# Patient Record
Sex: Male | Born: 1970
Health system: Southern US, Community
[De-identification: ages and names within clinical notes are randomized; demographics above are authoritative.]

## PROBLEM LIST (undated history)

## (undated) ENCOUNTER — Emergency Department: Admission: EM | Discharge status: Absent without leave | Payer: Self-pay

## (undated) DIAGNOSIS — I10 Essential (primary) hypertension: Secondary | ICD-10-CM

## (undated) HISTORY — PX: OTHER SURGICAL HISTORY: SHX169

---

## 2013-07-25 ENCOUNTER — Emergency Department: Payer: Self-pay | Admitting: Emergency Medicine

## 2013-07-25 LAB — RAPID INFLUENZA A&B ANTIGENS

## 2013-08-17 ENCOUNTER — Emergency Department: Payer: Self-pay | Admitting: Emergency Medicine

## 2013-09-22 ENCOUNTER — Emergency Department: Payer: Self-pay | Admitting: Emergency Medicine

## 2013-09-24 ENCOUNTER — Emergency Department: Payer: Self-pay | Admitting: Emergency Medicine

## 2013-11-26 ENCOUNTER — Emergency Department: Payer: Self-pay | Admitting: Emergency Medicine

## 2013-11-26 LAB — COMPREHENSIVE METABOLIC PANEL
ALBUMIN: 3.9 g/dL (ref 3.4–5.0)
ALT: 33 U/L (ref 12–78)
Alkaline Phosphatase: 111 U/L
Anion Gap: 8 (ref 7–16)
BUN: 14 mg/dL (ref 7–18)
Bilirubin,Total: 0.3 mg/dL (ref 0.2–1.0)
CO2: 24 mmol/L (ref 21–32)
Calcium, Total: 8.9 mg/dL (ref 8.5–10.1)
Chloride: 105 mmol/L (ref 98–107)
Creatinine: 1.34 mg/dL — ABNORMAL HIGH (ref 0.60–1.30)
EGFR (African American): >60
EGFR (Non-African Amer.): >60
Glucose: 86 mg/dL (ref 65–99)
Osmolality: 274 (ref 275–301)
Potassium: 3.5 mmol/L (ref 3.5–5.1)
SGOT(AST): 30 U/L (ref 15–37)
Sodium: 137 mmol/L (ref 136–145)
Total Protein: 7.8 g/dL (ref 6.4–8.2)

## 2013-11-26 LAB — URINALYSIS, COMPLETE
Bacteria: NONE SEEN
Bilirubin,UR: NEGATIVE
Blood: NEGATIVE
GLUCOSE, UR: NEGATIVE mg/dL (ref 0–75)
KETONE: NEGATIVE
Leukocyte Esterase: NEGATIVE
Nitrite: NEGATIVE
PH: 6 (ref 4.5–8.0)
RBC,UR: 1 /HPF (ref 0–5)
SPECIFIC GRAVITY: 1.017 (ref 1.003–1.030)
Squamous Epithelial: NONE SEEN

## 2013-11-26 LAB — CBC WITH DIFFERENTIAL/PLATELET
BASOS ABS: 0.1 10*3/uL (ref 0.0–0.1)
Basophil %: 1.5 %
Eosinophil #: 0.1 10*3/uL (ref 0.0–0.7)
Eosinophil %: 0.8 %
HCT: 40.3 % (ref 40.0–52.0)
HGB: 13.3 g/dL (ref 13.0–18.0)
Lymphocyte #: 2.3 10*3/uL (ref 1.0–3.6)
Lymphocyte %: 31.5 %
MCH: 29.4 pg (ref 26.0–34.0)
MCHC: 32.9 g/dL (ref 32.0–36.0)
MCV: 89 fL (ref 80–100)
MONO ABS: 0.9 x10 3/mm (ref 0.2–1.0)
Monocyte %: 12.9 %
NEUTROS ABS: 3.9 10*3/uL (ref 1.4–6.5)
Neutrophil %: 53.3 %
Platelet: 204 10*3/uL (ref 150–440)
RBC: 4.53 10*6/uL (ref 4.40–5.90)
RDW: 13.1 % (ref 11.5–14.5)
WBC: 7.3 10*3/uL (ref 3.8–10.6)

## 2013-11-26 LAB — LIPASE, BLOOD: LIPASE: 147 U/L (ref 73–393)

## 2013-11-26 LAB — TROPONIN I: Troponin-I: <0.02 ng/mL

## 2014-01-14 ENCOUNTER — Encounter (HOSPITAL_COMMUNITY): Payer: Self-pay | Admitting: Emergency Medicine

## 2014-01-14 ENCOUNTER — Emergency Department (HOSPITAL_COMMUNITY)
Admission: EM | Admit: 2014-01-14 | Discharge: 2014-01-14 | Disposition: A | Discharge status: Home / Self Care | Payer: Self-pay | Attending: Emergency Medicine | Admitting: Emergency Medicine

## 2014-01-14 DIAGNOSIS — M79609 Pain in unspecified limb: Secondary | ICD-10-CM | POA: Insufficient documentation

## 2014-01-14 DIAGNOSIS — Z87891 Personal history of nicotine dependence: Secondary | ICD-10-CM | POA: Insufficient documentation

## 2014-01-14 DIAGNOSIS — M722 Plantar fascial fibromatosis: Secondary | ICD-10-CM | POA: Insufficient documentation

## 2014-01-14 DIAGNOSIS — I10 Essential (primary) hypertension: Secondary | ICD-10-CM | POA: Insufficient documentation

## 2014-01-14 DIAGNOSIS — Z880 Allergy status to penicillin: Secondary | ICD-10-CM | POA: Insufficient documentation

## 2014-01-14 HISTORY — DX: Essential (primary) hypertension: I10

## 2014-01-14 MED ORDER — TRAMADOL-ACETAMINOPHEN 37.5-325 MG PO TABS
1.0000 | ORAL_TABLET | Freq: Four times a day (QID) | ORAL | Status: DC | PRN
Start: 2014-01-14 — End: 2015-01-17

## 2014-01-14 NOTE — ED Notes (Addendum)
Pain in the arch of r/foot x 6 months. Hx of plantar fascitis in same foot. Denies recent trauma. Pain unresponsive to OTC meds

## 2014-01-14 NOTE — ED Provider Notes (Signed)
Medical screening examination/treatment/procedure(s) were performed by non-physician practitioner and as supervising physician I was immediately available for consultation/collaboration.   EKG Interpretation None       Threasa Beards, MD 01/14/14 1515

## 2014-01-14 NOTE — Discharge Instructions (Signed)
Take the prescribed medication as directed.  May wish to freeze water bottle and roll under foot as we discussed. Follow-up with orthopedics for further management-- call and schedule appt. Return to the ED for new or worsening symptoms.

## 2014-01-14 NOTE — ED Provider Notes (Signed)
CSN: [id number]     Arrival date & time 01/14/14  1257 History  This chart was scribed for [name], working with Threasa Beards, MD found by Starleen Arms, ED Scribe. This patient was seen in room WTR6/WTR6 and the patient's care was started at 1:28 PM.     Chief Complaint  Patient presents with  . Foot Pain    6 month hx of foot pain   The history is provided by the patient. No language interpreter was used.    HPI Comments: Ronald [name] is a 43 y.o. male who presents to the Emergency Department complaining of worsening right medial foot pain.  Patient states 1.5 years ago he was on a treadmill and the following day he woke and it felt like "fire shot up through my heel". He was subsequently diagnosed with plantar fasciatis.  He states the pain subsided and recently returned with worsening.  He states he currently has to walk on the lateral aspect of his foot due to the pain.  He reports that this alteration of gait has caused pain in his right calf and right hip.  He states that the pain is relieved by rolling a can underneath the affected foot and aggravated by walking. He currently uses shoe inserts to relieve the pain which he states "work to a certain degree".   Past Medical History  Diagnosis Date  . Hypertension    Past Surgical History  Procedure Laterality Date  . Arm surgery      torn bicept   Family History  Problem Relation Age of Onset  . Hypertension Mother   . Hypertension Father   . Cancer Other    History  Substance Use Topics  . Smoking status: Former Research scientist (life sciences)  . Smokeless tobacco: Not on file  . Alcohol Use: No    Review of Systems  Musculoskeletal: Positive for arthralgias.  Neurological: Positive for numbness.  All other systems reviewed and are negative.     Allergies  Penicillins and Sulfa antibiotics  Home Medications   Prior to Admission medications   Medication Sig Start Date End Date Taking? Authorizing Provider   aspirin-acetaminophen-caffeine (EXCEDRIN MIGRAINE) [phone] MG per tablet Take 2 tablets by mouth every 6 (six) hours as needed for headache.   Yes Historical Provider, MD   BP 109/68  Pulse 70  Temp(Src) 98.1 F (36.7 C) (Oral)  Resp 18  Wt 197 lb (89.359 kg)  SpO2 98%  Physical Exam  Nursing note and vitals reviewed. Constitutional: He is oriented to person, place, and time. He appears well-developed and well-nourished. No distress.  HENT:  Head: Normocephalic and atraumatic.  Mouth/Throat: Oropharynx is clear and moist.  Eyes: Conjunctivae and EOM are normal. Pupils are equal, round, and reactive to light.  Neck: Normal range of motion. Neck supple.  Cardiovascular: Normal rate, regular rhythm and normal heart sounds.   Pulmonary/Chest: Effort normal and breath sounds normal. No respiratory distress. He has no wheezes.  Musculoskeletal: Normal range of motion.  Endorses pain at arch of right foot extending to heel, non-tender to palpation; full ROM of ankle, foot, and all toes; foot remains NVI  Neurological: He is alert and oriented to person, place, and time.  Skin: Skin is warm and dry. He is not diaphoretic.  Psychiatric: He has a normal mood and affect.    ED Course  Procedures (including critical care time)  DIAGNOSTIC STUDIES: Oxygen Saturation is 98% on RA, normal by my interpretation.    COORDINATION  OF CARE:  1:36 PM Discussed treatment plan with patient at bedside.  Patient acknowledges and agrees with plan.    Labs Review Labs Reviewed - No data to display  Imaging Review No results found.   EKG Interpretation None      MDM   Final diagnoses:  Plantar fasciitis of right foot   Sx and PE findings consistent with recurrent plantar fasciitis.  Encouraged to continue stretching exercises, ultracet for pain control.  FU with orthopedics for further management.  Discussed plan with patient, he/she acknowledged understanding and agreed with plan of  care.  Return precautions given for new or worsening symptoms.  I personally performed the services described in this documentation, which was scribed in my presence. The recorded information has been reviewed and is accurate.  Larene Pickett, PA-C 01/14/14 1404

## 2014-01-26 ENCOUNTER — Emergency Department (HOSPITAL_COMMUNITY): Payer: Self-pay

## 2014-01-26 ENCOUNTER — Emergency Department (HOSPITAL_COMMUNITY)
Admission: EM | Admit: 2014-01-26 | Discharge: 2014-01-26 | Disposition: A | Discharge status: Home / Self Care | Payer: Self-pay | Attending: Emergency Medicine | Admitting: Emergency Medicine

## 2014-01-26 ENCOUNTER — Encounter (HOSPITAL_COMMUNITY): Payer: Self-pay | Admitting: Emergency Medicine

## 2014-01-26 DIAGNOSIS — Z79899 Other long term (current) drug therapy: Secondary | ICD-10-CM | POA: Insufficient documentation

## 2014-01-26 DIAGNOSIS — R059 Cough, unspecified: Secondary | ICD-10-CM | POA: Insufficient documentation

## 2014-01-26 DIAGNOSIS — Z880 Allergy status to penicillin: Secondary | ICD-10-CM | POA: Insufficient documentation

## 2014-01-26 DIAGNOSIS — J069 Acute upper respiratory infection, unspecified: Secondary | ICD-10-CM | POA: Insufficient documentation

## 2014-01-26 DIAGNOSIS — Z7982 Long term (current) use of aspirin: Secondary | ICD-10-CM | POA: Insufficient documentation

## 2014-01-26 DIAGNOSIS — I10 Essential (primary) hypertension: Secondary | ICD-10-CM | POA: Insufficient documentation

## 2014-01-26 DIAGNOSIS — R509 Fever, unspecified: Secondary | ICD-10-CM | POA: Insufficient documentation

## 2014-01-26 DIAGNOSIS — B9789 Other viral agents as the cause of diseases classified elsewhere: Secondary | ICD-10-CM

## 2014-01-26 DIAGNOSIS — IMO0001 Reserved for inherently not codable concepts without codable children: Secondary | ICD-10-CM | POA: Insufficient documentation

## 2014-01-26 DIAGNOSIS — R05 Cough: Secondary | ICD-10-CM | POA: Insufficient documentation

## 2014-01-26 DIAGNOSIS — R51 Headache: Secondary | ICD-10-CM | POA: Insufficient documentation

## 2014-01-26 MED ORDER — IBUPROFEN 800 MG PO TABS: 800.0000 mg | ORAL_TABLET | Freq: Three times a day (TID) | ORAL | Status: DC | PRN

## 2014-01-26 MED ORDER — GUAIFENESIN ER 1200 MG PO TB12: 1.0000 | ORAL_TABLET | Freq: Two times a day (BID) | ORAL | Status: DC

## 2014-01-26 MED ORDER — PROMETHAZINE-DM 6.25-15 MG/5ML PO SYRP: 5.0000 mL | ORAL_SOLUTION | Freq: Four times a day (QID) | ORAL | Status: DC | PRN

## 2014-01-26 MED ORDER — SODIUM CHLORIDE 0.9 % IV BOLUS (SEPSIS)
2000.0000 mL | Freq: Once | INTRAVENOUS | Status: AC
  Administered 2014-01-26: 2000 mL via INTRAVENOUS

## 2014-01-26 MED ORDER — PREDNISONE 50 MG PO TABS: 50.0000 mg | ORAL_TABLET | Freq: Every day | ORAL | Status: DC

## 2014-01-26 NOTE — ED Provider Notes (Signed)
CSN: [id number]     Arrival date & time 01/26/14  4098 History   First MD Initiated Contact with Patient 01/26/14 [phone]     Chief Complaint  Patient presents with  . Cough  . Fever     (Consider location/radiation/quality/duration/timing/severity/associated sxs/prior Treatment) Patient is a 43 y.o. male presenting with cough and fever.  Cough Associated symptoms: chills, ear pain, fever, headaches, myalgias and sore throat   Associated symptoms: no eye discharge, no rhinorrhea, no shortness of breath and no wheezing   Fever Associated symptoms: chills, cough, ear pain, headaches, myalgias and sore throat   Associated symptoms: no congestion, no nausea, no rhinorrhea and no vomiting    HPI:  Ronald [name] is a 43 year old male with a history of untreated hypertension and recurrent bronchitis during childhood, who presents with complaints of fever, productive cough, and "lungs burning". States that he developed malaise, headache, sore throat and ear ache 3 days ago, and last night his symptoms progressed to chills and productive cough. Sputum is greenish, patient denies any hemoptysis. Denies any nausea, vomiting, rhinorrhea, conjunctivitis, stiff neck, or chest pain. Treated at home with Theraflu, without improvement. Admits to coming in contact with a sick co-worker of [place] origin about two weeks ago, but does not know the nature of his illness. Denies any travel abroad and unaware of any exposure to tuberculosis. Has three young children at home, with two in school/daycare and reports the youngest (infant) has unspecified mild upper respiratory infection.   Past Medical History  Diagnosis Date  . Hypertension    Past Surgical History  Procedure Laterality Date  . Arm surgery      torn bicept   Family History  Problem Relation Age of Onset  . Hypertension Mother   . Hypertension Father   . Cancer Other    History  Substance Use Topics  . Smoking status: Former Research scientist (life sciences)  .  Smokeless tobacco: Not on file  . Alcohol Use: No    Review of Systems  Constitutional: Positive for fever and chills.  HENT: Positive for ear pain and sore throat. Negative for congestion, rhinorrhea and sinus pressure.   Eyes: Negative for discharge and redness.  Respiratory: Positive for cough. Negative for chest tightness, shortness of breath and wheezing.   Cardiovascular: Negative.   Gastrointestinal: Negative for nausea and vomiting.  Musculoskeletal: Positive for myalgias. Negative for neck pain and neck stiffness.  Neurological: Positive for headaches.  Hematological: Negative for adenopathy.   Allergies  Penicillins and Sulfa antibiotics  Home Medications   Prior to Admission medications   Medication Sig Start Date End Date Taking? Authorizing Provider  aspirin-acetaminophen-caffeine (EXCEDRIN MIGRAINE) [phone] MG per tablet Take 2 tablets by mouth every 6 (six) hours as needed for headache.   Yes Historical Provider, MD  traMADol-acetaminophen (ULTRACET) 37.5-325 MG per tablet Take 1 tablet by mouth every 6 (six) hours as needed. 01/14/14  Yes Larene Pickett, PA-C  Guaifenesin 1200 MG TB12 Take 1 tablet (1,200 mg total) by mouth 2 (two) times daily. 01/26/14   Resa Miner Herley Bernardini, PA-C  ibuprofen (ADVIL,MOTRIN) 800 MG tablet Take 1 tablet (800 mg total) by mouth every 8 (eight) hours as needed. 01/26/14   Resa Miner Dawnita Molner, PA-C  predniSONE (DELTASONE) 50 MG tablet Take 1 tablet (50 mg total) by mouth daily. 01/26/14   [place], PA-C  promethazine-dextromethorphan (PROMETHAZINE-DM) 6.25-15 MG/5ML syrup Take 5 mLs by mouth 4 (four) times daily as needed for cough. 01/26/14  Resa Miner Ajla Mcgeachy, PA-C   BP 144/68  Pulse 88  Temp(Src) 99.7 F (37.6 C) (Oral)  Resp 18  Wt 195 lb (88.451 kg)  SpO2 100% Physical Exam  Vitals reviewed. Constitutional: He appears well-developed and well-nourished. He appears distressed.  Patient is laying on bed, coughing  occasionally and complains of malaise.   HENT:  Right Ear: Tympanic membrane is not bulging. A middle ear effusion is present.  Left Ear: Tympanic membrane is not bulging. A middle ear effusion is present.  Nose: No rhinorrhea.  Mouth/Throat: Oropharynx is clear and moist and mucous membranes are normal. No oropharyngeal exudate, posterior oropharyngeal edema or posterior oropharyngeal erythema.  Eyes: Conjunctivae are normal. Right eye exhibits no discharge. Left eye exhibits no discharge.  Neck: Neck supple.  Cardiovascular: Normal rate, regular rhythm and normal heart sounds.   Pulmonary/Chest: Effort normal and breath sounds normal. No accessory muscle usage. He has no wheezes. He has no rhonchi. He has no rales.  Lymphadenopathy:    He has no cervical adenopathy.  Neurological: He is alert.  Skin: Skin is warm.    ED Course  Procedures (including critical care time) Labs Review Labs Reviewed - No data to display  Imaging Review Dg Chest 2 View  01/26/2014   CLINICAL DATA:  Cough with fever and pain  EXAM: CHEST  2 VIEW  COMPARISON:  None.  FINDINGS: Normal heart size and mediastinal contours. No acute infiltrate or edema. No effusion or pneumothorax. No acute osseous findings.  IMPRESSION: No active cardiopulmonary disease.   Electronically Signed   By: Jorje Guild M.D.   On: 01/26/2014 09:15   Patient be treated for viral URI with cough.  The patient's best increase his fluid intake, and rest as much possible.  Told to return here for any worsening in his condition.  Patient, agrees to the plan and all questions were answered.  The patient was given IV fluids and is improved      Final diagnoses:  Viral URI with cough        Brent General, PA-C 01/27/14 1650

## 2014-01-26 NOTE — ED Notes (Signed)
Pt reports productive cough, fever, headache and body aches x 3 days. Denies n/v/d. He says "I feel like my lungs are on fire".

## 2014-01-26 NOTE — ED Notes (Signed)
Patient transported to X-ray 

## 2014-01-26 NOTE — Progress Notes (Signed)
[place],  Did not get to see patient but will be sending information about [facility] program to help patient establish primary care, using the address provided.

## 2014-01-26 NOTE — Discharge Instructions (Signed)
Return here as needed.  Increase your fluid intake.  Rest as much as possible. °

## 2014-01-29 NOTE — ED Provider Notes (Signed)
Medical screening examination/treatment/procedure(s) were performed by non-physician practitioner and as supervising physician I was immediately available for consultation/collaboration.   EKG Interpretation None       Threasa Beards, MD 01/29/14 1504

## 2014-02-24 ENCOUNTER — Emergency Department (HOSPITAL_COMMUNITY)
Admission: EM | Admit: 2014-02-24 | Discharge: 2014-02-24 | Disposition: A | Discharge status: Home / Self Care | Payer: Self-pay | Attending: Emergency Medicine | Admitting: Emergency Medicine

## 2014-02-24 ENCOUNTER — Encounter (HOSPITAL_COMMUNITY): Payer: Self-pay | Admitting: Emergency Medicine

## 2014-02-24 DIAGNOSIS — M25569 Pain in unspecified knee: Secondary | ICD-10-CM | POA: Insufficient documentation

## 2014-02-24 DIAGNOSIS — M722 Plantar fascial fibromatosis: Secondary | ICD-10-CM | POA: Insufficient documentation

## 2014-02-24 DIAGNOSIS — Z880 Allergy status to penicillin: Secondary | ICD-10-CM | POA: Insufficient documentation

## 2014-02-24 DIAGNOSIS — IMO0001 Reserved for inherently not codable concepts without codable children: Secondary | ICD-10-CM | POA: Insufficient documentation

## 2014-02-24 DIAGNOSIS — Z87891 Personal history of nicotine dependence: Secondary | ICD-10-CM | POA: Insufficient documentation

## 2014-02-24 DIAGNOSIS — I10 Essential (primary) hypertension: Secondary | ICD-10-CM | POA: Insufficient documentation

## 2014-02-24 DIAGNOSIS — IMO0002 Reserved for concepts with insufficient information to code with codable children: Secondary | ICD-10-CM | POA: Insufficient documentation

## 2014-02-24 DIAGNOSIS — M25562 Pain in left knee: Secondary | ICD-10-CM

## 2014-02-24 DIAGNOSIS — M25561 Pain in right knee: Secondary | ICD-10-CM

## 2014-02-24 DIAGNOSIS — Z79899 Other long term (current) drug therapy: Secondary | ICD-10-CM | POA: Insufficient documentation

## 2014-02-24 MED ORDER — METHOCARBAMOL 500 MG PO TABS: 500.0000 mg | ORAL_TABLET | Freq: Two times a day (BID) | ORAL | Status: DC

## 2014-02-24 MED ORDER — NAPROXEN 500 MG PO TABS: 500.0000 mg | ORAL_TABLET | Freq: Two times a day (BID) | ORAL | Status: DC

## 2014-02-24 NOTE — ED Notes (Signed)
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  Pt also c/o right heel pain intermittent for 2 years.

## 2014-02-24 NOTE — ED Provider Notes (Signed)
Medical screening examination/treatment/procedure(s) were performed by non-physician practitioner and as supervising physician I was immediately available for consultation/collaboration.  Jabari Swoveland T Eyad Rochford, MD 02/24/14 2315 

## 2014-02-24 NOTE — Discharge Instructions (Signed)
Please purchase and wear knee sleeves to provide support of your knee pain.  Wear shoes with arch support to decrease pain due to plantar fasciitis.  Follow up with orthopedic doctor for further care.    Arthralgia Your caregiver has diagnosed you as suffering from an arthralgia. Arthralgia means there is pain in a joint. This can come from many reasons including:  Bruising the joint which causes soreness (inflammation) in the joint.  Wear and tear on the joints which occur as we grow older (osteoarthritis).  Overusing the joint.  Various forms of arthritis.  Infections of the joint. Regardless of the cause of pain in your joint, most of these different pains respond to anti-inflammatory drugs and rest. The exception to this is when a joint is infected, and these cases are treated with antibiotics, if it is a bacterial infection. HOME CARE INSTRUCTIONS   Rest the injured area for as long as directed by your caregiver. Then slowly start using the joint as directed by your caregiver and as the pain allows. Crutches as directed may be useful if the ankles, knees or hips are involved. If the knee was splinted or casted, continue use and care as directed. If an stretchy or elastic wrapping bandage has been applied today, it should be removed and re-applied every 3 to 4 hours. It should not be applied tightly, but firmly enough to keep swelling down. Watch toes and feet for swelling, bluish discoloration, coldness, numbness or excessive pain. If any of these problems (symptoms) occur, remove the ace bandage and re-apply more loosely. If these symptoms persist, contact your caregiver or return to this location.  For the first 24 hours, keep the injured extremity elevated on pillows while lying down.  Apply ice for 15-20 minutes to the sore joint every couple hours while awake for the first half day. Then 03-04 times per day for the first 48 hours. Put the ice in a plastic bag and place a towel between  the bag of ice and your skin.  Wear any splinting, casting, elastic bandage applications, or slings as instructed.  Only take over-the-counter or prescription medicines for pain, discomfort, or fever as directed by your caregiver. Do not use aspirin immediately after the injury unless instructed by your physician. Aspirin can cause increased bleeding and bruising of the tissues.  If you were given crutches, continue to use them as instructed and do not resume weight bearing on the sore joint until instructed. Persistent pain and inability to use the sore joint as directed for more than 2 to 3 days are warning signs indicating that you should see a caregiver for a follow-up visit as soon as possible. Initially, a hairline fracture (break in bone) may not be evident on X-rays. Persistent pain and swelling indicate that further evaluation, non-weight bearing or use of the joint (use of crutches or slings as instructed), or further X-rays are indicated. X-rays may sometimes not show a small fracture until a week or 10 days later. Make a follow-up appointment with your own caregiver or one to whom we have referred you. A radiologist (specialist in reading X-rays) may read your X-rays. Make sure you know how you are to obtain your X-ray results. Do not assume everything is normal if you do not hear from [place]. SEEK MEDICAL CARE IF: Bruising, swelling, or pain increases. SEEK IMMEDIATE MEDICAL CARE IF:   Your fingers or toes are numb or blue.  The pain is not responding to medications and continues to  stay the same or get worse.  The pain in your joint becomes severe.  You develop a fever over 102 F (38.9 C).  It becomes impossible to move or use the joint. MAKE SURE YOU:   Understand these instructions.  Will watch your condition.  Will get help right away if you are not doing well or get worse. Document Released: 05/14/2005 Document Revised: 08/06/2011 Document Reviewed: 12/31/2007 [facility]  Patient Information 2015 [place], [place]. This information is not intended to replace advice given to you by your health care provider. Make sure you discuss any questions you have with your health care provider.  Plantar Fasciitis Plantar fasciitis is a common condition that causes foot pain. It is soreness (inflammation) of the band of tough fibrous tissue on the bottom of the foot that runs from the heel bone (calcaneus) to the ball of the foot. The cause of this soreness may be from excessive standing, poor fitting shoes, running on hard surfaces, being overweight, having an abnormal walk, or overuse (this is common in runners) of the painful foot or feet. It is also common in aerobic exercise dancers and ballet dancers. SYMPTOMS  Most people with plantar fasciitis complain of:  Severe pain in the morning on the bottom of their foot especially when taking the first steps out of bed. This pain recedes after a few minutes of walking.  Severe pain is experienced also during walking following a long period of inactivity.  Pain is worse when walking barefoot or up stairs DIAGNOSIS   Your caregiver will diagnose this condition by examining and feeling your foot.  Special tests such as X-rays of your foot, are usually not needed. PREVENTION   Consult a sports medicine professional before beginning a new exercise program.  Walking programs offer a good workout. With walking there is a lower chance of overuse injuries common to runners. There is less impact and less jarring of the joints.  Begin all new exercise programs slowly. If problems or pain develop, decrease the amount of time or distance until you are at a comfortable level.  Wear good shoes and replace them regularly.  Stretch your foot and the heel cords at the back of the ankle (Achilles tendon) both before and after exercise.  Run or exercise on even surfaces that are not hard. For example, asphalt is better than pavement.  Do  not run barefoot on hard surfaces.  If using a treadmill, vary the incline.  Do not continue to workout if you have foot or joint problems. Seek professional help if they do not improve. HOME CARE INSTRUCTIONS   Avoid activities that cause you pain until you recover.  Use ice or cold packs on the problem or painful areas after working out.  Only take over-the-counter or prescription medicines for pain, discomfort, or fever as directed by your caregiver.  Soft shoe inserts or athletic shoes with air or gel sole cushions may be helpful.  If problems continue or become more severe, consult a sports medicine caregiver or your own health care provider. Cortisone is a potent anti-inflammatory medication that may be injected into the painful area. You can discuss this treatment with your caregiver. MAKE SURE YOU:   Understand these instructions.  Will watch your condition.  Will get help right away if you are not doing well or get worse. Document Released: 02/06/2001 Document Revised: 08/06/2011 Document Reviewed: 04/07/2008 [facility] Patient Information 2015 [place], [place]. This information is not intended to replace advice given to you  by your health care provider. Make sure you discuss any questions you have with your health care provider. ° °

## 2014-02-24 NOTE — ED Provider Notes (Signed)
CSN: [id number]     Arrival date & time 02/24/14  7564 History  This chart was scribed for non-physician practitioner, Domenic Moras, PA-C, working with Leota Jacobsen, MD, by Jeanell Sparrow, ED Scribe. This patient was seen in room WTR5/WTR5 and the patient's care was started at 6:58 PM.    Chief Complaint  Patient presents with  . Knee Pain  . Foot Pain    right heel   The history is provided by the patient. No language interpreter was used.   HPI Comments: Ronald [name] is a 43 y.o. male who presents to the Emergency Department complaining of constant moderate bilateral knee pain that has been going on for years. He states that yesterday that he had pain with a tightness in both knees that "feel as if they filled with fluid". He reports that he has knee pain daily that is exacerbated by exertion and being on his knees for extended periods of time. He describes the pain as a shooting sensation. He reports that he took ibuprofen without any relief. 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   Pt also complains of moderate intermittent right heel pain that started about 2 years ago. He states that in the past, stretching has given his relief. He denies any problems with ambulation.   Past Medical History  Diagnosis Date  . Hypertension    Past Surgical History  Procedure Laterality Date  . Arm surgery      torn bicept   Family History  Problem Relation Age of Onset  . Hypertension Mother   . Hypertension Father   . Cancer Other    History  Substance Use Topics  . Smoking status: Former Research scientist (life sciences)  . Smokeless tobacco: Not on file  . Alcohol Use: No    Review of Systems  Musculoskeletal: Positive for myalgias. Negative for gait problem.    Allergies  Penicillins and Sulfa antibiotics  Home Medications   Prior to Admission medications   Medication Sig Start Date End Date Taking? Authorizing Provider  aspirin-acetaminophen-caffeine (EXCEDRIN  MIGRAINE) [phone] MG per tablet Take 2 tablets by mouth every 6 (six) hours as needed for headache.    Historical Provider, MD  Guaifenesin 1200 MG TB12 Take 1 tablet (1,200 mg total) by mouth 2 (two) times daily. 01/26/14   Resa Miner Law[place], PA-C  ibuprofen (ADVIL,MOTRIN) 800 MG tablet Take 1 tablet (800 mg total) by mouth every 8 (eight) hours as needed. 01/26/14   Resa Miner Lawyer, PA-C  predniSONE (DELTASONE) 50 MG tablet Take 1 tablet (50 mg total) by mouth daily. 01/26/14   [place], PA-C  promethazine-dextromethorphan (PROMETHAZINE-DM) 6.25-15 MG/5ML syrup Take 5 mLs by mouth 4 (four) times daily as needed for cough. 01/26/14   [place], PA-C  traMADol-acetaminophen (ULTRACET) 37.5-325 MG per tablet Take 1 tablet by mouth every 6 (six) hours as needed. 01/14/14   Larene Pickett, PA-C   BP 163/85  Pulse 71  Temp(Src) 98.7 F (37.1 C) (Oral)  Resp 17  SpO2 98% Physical Exam  Nursing note and vitals reviewed. Constitutional: He is oriented to person, place, and time. He appears well-developed and well-nourished.  HENT:  Head: Normocephalic and atraumatic.  Neck: Neck supple. No tracheal deviation present.  Cardiovascular: Normal rate.   Pulmonary/Chest: Effort normal. No respiratory distress.  Musculoskeletal: Normal range of motion. He exhibits tenderness.  Bilateral TTP in the inferior patella bilaterally with no skin changes. Negative  anterior and posterior drawer test. Negative varus valgus test.  Sensations intact. No effusion noted. Right foot; pes planus on heel of foot worsened with plantar flexion. DP pulses intact. Right ankle full ROM.   Neurological: He is alert and oriented to person, place, and time.  Pt is ambulatory.   Skin: Skin is warm and dry.  Psychiatric: He has a normal mood and affect. His behavior is normal.    ED Course  Procedures (including critical care time) DIAGNOSTIC STUDIES: Oxygen Saturation is 98% on RA, normal by  my interpretation.    COORDINATION OF CARE: 7:05 PM- pain likely arthritis vs. Infrapatella bursitis.  No significant overlying skin changes noted on knee exam.  Recommend RICE.  Evidence of pes planus to R foot, with sxs suggestive of plantar fasciitis.  Recommend orthotic for support, and RICE.  Ortho referral given.  Pt advised of plan for treatment which includes medication and pt agrees.  Labs Review Labs Reviewed - No data to display  Imaging Review No results found.   EKG Interpretation None      MDM   Final diagnoses:  Bilateral anterior knee pain  Plantar fasciitis of right foot    BP 163/85  Pulse 71  Temp(Src) 98.7 F (37.1 C) (Oral)  Resp 17  SpO2 98%  I personally performed the services described in this documentation, which was scribed in my presence. The recorded information has been reviewed and is accurate.      Domenic Moras, PA-C 02/24/14 2005

## 2014-06-15 ENCOUNTER — Encounter (HOSPITAL_COMMUNITY): Payer: Self-pay | Admitting: Emergency Medicine

## 2014-06-15 ENCOUNTER — Emergency Department (HOSPITAL_COMMUNITY)
Admission: EM | Admit: 2014-06-15 | Discharge: 2014-06-15 | Disposition: A | Discharge status: Home / Self Care | Payer: Self-pay | Attending: Emergency Medicine | Admitting: Emergency Medicine

## 2014-06-15 ENCOUNTER — Emergency Department (HOSPITAL_COMMUNITY): Payer: Self-pay

## 2014-06-15 DIAGNOSIS — Z791 Long term (current) use of non-steroidal anti-inflammatories (NSAID): Secondary | ICD-10-CM | POA: Insufficient documentation

## 2014-06-15 DIAGNOSIS — Z880 Allergy status to penicillin: Secondary | ICD-10-CM | POA: Insufficient documentation

## 2014-06-15 DIAGNOSIS — Z7952 Long term (current) use of systemic steroids: Secondary | ICD-10-CM | POA: Insufficient documentation

## 2014-06-15 DIAGNOSIS — Z79899 Other long term (current) drug therapy: Secondary | ICD-10-CM | POA: Insufficient documentation

## 2014-06-15 DIAGNOSIS — Z720 Tobacco use: Secondary | ICD-10-CM | POA: Insufficient documentation

## 2014-06-15 DIAGNOSIS — F419 Anxiety disorder, unspecified: Secondary | ICD-10-CM | POA: Insufficient documentation

## 2014-06-15 DIAGNOSIS — I10 Essential (primary) hypertension: Secondary | ICD-10-CM | POA: Insufficient documentation

## 2014-06-15 LAB — CBC
HCT: 38.7 % — ABNORMAL LOW (ref 39.0–52.0)
HEMOGLOBIN: 13.4 g/dL (ref 13.0–17.0)
MCH: 30.6 pg (ref 26.0–34.0)
MCHC: 34.6 g/dL (ref 30.0–36.0)
MCV: 88.4 fL (ref 78.0–100.0)
PLATELETS: 195 10*3/uL (ref 150–400)
RBC: 4.38 MIL/uL (ref 4.22–5.81)
RDW: 13.2 % (ref 11.5–15.5)
WBC: 5.3 10*3/uL (ref 4.0–10.5)

## 2014-06-15 LAB — BASIC METABOLIC PANEL
Anion gap: 8 (ref 5–15)
BUN: 16 mg/dL (ref 6–23)
CO2: 23 mmol/L (ref 19–32)
Calcium: 8.9 mg/dL (ref 8.4–10.5)
Chloride: 108 mEq/L (ref 96–112)
Creatinine, Ser: 1.2 mg/dL (ref 0.50–1.35)
GFR calc Af Amer: 84 mL/min — ABNORMAL LOW (ref >90)
GFR calc non Af Amer: 73 mL/min — ABNORMAL LOW (ref >90)
GLUCOSE: 96 mg/dL (ref 70–99)
Potassium: 3.6 mmol/L (ref 3.5–5.1)
Sodium: 139 mmol/L (ref 135–145)

## 2014-06-15 LAB — I-STAT TROPONIN, ED: Troponin i, poc: 0 ng/mL (ref 0.00–0.08)

## 2014-06-15 LAB — BRAIN NATRIURETIC PEPTIDE: B NATRIURETIC PEPTIDE 5: 32.8 pg/mL (ref 0.0–100.0)

## 2014-06-15 MED ORDER — LISINOPRIL-HYDROCHLOROTHIAZIDE 10-12.5 MG PO TABS: 1.0000 | ORAL_TABLET | Freq: Every day | ORAL | Status: DC

## 2014-06-15 MED ORDER — LORAZEPAM 1 MG PO TABS: 1.0000 mg | ORAL_TABLET | Freq: Three times a day (TID) | ORAL | Status: DC | PRN

## 2014-06-15 MED ORDER — HYDROCHLOROTHIAZIDE 12.5 MG PO CAPS
12.5000 mg | ORAL_CAPSULE | Freq: Once | ORAL | Status: AC
  Administered 2014-06-15: 12.5 mg via ORAL
  Filled 2014-06-15: qty 1

## 2014-06-15 NOTE — ED Provider Notes (Signed)
CSN: [id number]     Arrival date & time 06/15/14  2149 History   First MD Initiated Contact with Patient 06/15/14 2235     Chief Complaint  Patient presents with  . Shortness of Breath  . Hypertension     (Consider location/radiation/quality/duration/timing/severity/associated sxs/prior Treatment) Patient is a 44 y.o. male presenting with shortness of breath and hypertension. The history is provided by the patient.  Shortness of Breath Severity:  Moderate Associated symptoms: no abdominal pain, no chest pain, no headaches, no rash and no vomiting   Hypertension Associated symptoms include shortness of breath. Pertinent negatives include no chest pain, no abdominal pain and no headaches.   patient has had some shortness of breath. Some dull chest pain. States he has a history of hypertension but is off his medication. He states he was not able to afford the medication to see Dr. He does not have insurance. He denies substance abuse. States he is not taking now. States he has a history of anxiety has been having episodes of anxiety.  Past Medical History  Diagnosis Date  . Hypertension    Past Surgical History  Procedure Laterality Date  . Arm surgery      torn bicept   Family History  Problem Relation Age of Onset  . Hypertension Mother   . Hypertension Father   . Cancer Other    History  Substance Use Topics  . Smoking status: Current Some Day Smoker    Types: Cigarettes  . Smokeless tobacco: Not on file  . Alcohol Use: Yes     Comment: occ    Review of Systems  Constitutional: Negative for activity change and appetite change.  Eyes: Negative for pain.  Respiratory: Positive for shortness of breath. Negative for chest tightness.   Cardiovascular: Negative for chest pain and leg swelling.  Gastrointestinal: Negative for nausea, vomiting, abdominal pain and diarrhea.  Genitourinary: Negative for flank pain.  Musculoskeletal: Negative for back pain and neck stiffness.   Skin: Negative for rash.  Neurological: Negative for weakness, numbness and headaches.  Psychiatric/Behavioral: Negative for behavioral problems.      Allergies  Penicillins and Sulfa antibiotics  Home Medications   Prior to Admission medications   Medication Sig Start Date End Date Taking? Authorizing Provider  hydrochlorothiazide (HYDRODIURIL) 25 MG tablet Take 25 mg by mouth daily.   Yes Historical Provider, MD  aspirin-acetaminophen-caffeine (EXCEDRIN MIGRAINE) [phone] MG per tablet Take 2 tablets by mouth every 6 (six) hours as needed for headache.    Historical Provider, MD  Guaifenesin 1200 MG TB12 Take 1 tablet (1,200 mg total) by mouth 2 (two) times daily. Patient not taking: Reported on 06/15/2014 01/26/14   Resa Miner Lawyer, PA-C  ibuprofen (ADVIL,MOTRIN) 800 MG tablet Take 1 tablet (800 mg total) by mouth every 8 (eight) hours as needed. Patient not taking: Reported on 06/15/2014 01/26/14   Resa Miner Lawyer, PA-C  lisinopril-hydrochlorothiazide (PRINZIDE,ZESTORETIC) 10-12.5 MG per tablet Take 1 tablet by mouth daily. 06/15/14   Jasper Riling. Marris Frontera, MD  LORazepam (ATIVAN) 1 MG tablet Take 1 tablet (1 mg total) by mouth 3 (three) times daily as needed for anxiety. 06/15/14   Jasper Riling. Aaryn Sermon, MD  methocarbamol (ROBAXIN) 500 MG tablet Take 1 tablet (500 mg total) by mouth 2 (two) times daily. Patient not taking: Reported on 06/15/2014 02/24/14   Domenic Moras, PA-C  naproxen (NAPROSYN) 500 MG tablet Take 1 tablet (500 mg total) by mouth 2 (two) times daily. Patient not taking: Reported on  06/15/2014 02/24/14   Domenic Moras, PA-C  predniSONE (DELTASONE) 50 MG tablet Take 1 tablet (50 mg total) by mouth daily. Patient not taking: Reported on 06/15/2014 01/26/14   Resa Miner Lawyer, PA-C  promethazine-dextromethorphan (PROMETHAZINE-DM) 6.25-15 MG/5ML syrup Take 5 mLs by mouth 4 (four) times daily as needed for cough. Patient not taking: Reported on 06/15/2014 01/26/14   Resa Miner  Lawyer, PA-C  traMADol-acetaminophen (ULTRACET) 37.5-325 MG per tablet Take 1 tablet by mouth every 6 (six) hours as needed. Patient not taking: Reported on 06/15/2014 01/14/14   Larene Pickett, PA-C   BP 151/95 mmHg  Pulse 74  Temp(Src) 97.6 F (36.4 C) (Oral)  Resp 16  SpO2 100% Physical Exam  ED Course  Procedures (including critical care time) Labs Review Labs Reviewed  CBC - Abnormal; Notable for the following:    HCT 38.7 (*)    All other components within normal limits  BASIC METABOLIC PANEL - Abnormal; Notable for the following:    GFR calc non Af Amer 73 (*)    GFR calc Af Amer 84 (*)    All other components within normal limits  BRAIN NATRIURETIC PEPTIDE  I-STAT TROPOININ, ED    Imaging Review Dg Chest Port 1 View  06/15/2014   CLINICAL DATA:  Shortness of breath and hypertension  EXAM: PORTABLE CHEST - 1 VIEW  COMPARISON:  01/26/2014  FINDINGS: Heart size is normal. Increased prominence of the ascending aorta is likely from slight rightward rotation. The hila are negative. No acute infiltrate or edema. No effusion or pneumothorax. No acute osseous findings.  IMPRESSION: No active disease.   Electronically Signed   By: Jorje Guild M.D.   On: 06/15/2014 22:26     EKG Interpretation   Date/Time:  Tuesday June 15 2014 22:01:54 EST Ventricular Rate:  71 PR Interval:  166 QRS Duration: 99 QT Interval:  392 QTC Calculation: 426 R Axis:   69 Text Interpretation:  Sinus rhythm Abnrm T, consider ischemia,  anterolateral lds No old tracing to compare Confirmed by Alvino Chapel  MD,  [place] [phone]) on 06/15/2014 10:42:42 PM      MDM   Final diagnoses:  Essential hypertension  Anxiety    Patient with hypertension. Some LVH with strain on the EKG but blood pressure is not very elevated at this time. Troponin is negative. Blood pressure is elevated a little bit but his been off his medications. Will start lisinopril and HCTZ. Will follow-up with [facility]  since he does not have his PCP.    Jasper Riling. Alvino Chapel, MD 06/15/14 2329

## 2014-06-15 NOTE — Discharge Instructions (Signed)
Hypertension °Hypertension, commonly called high blood pressure, is when the force of blood pumping through your arteries is too strong. Your arteries are the blood vessels that carry blood from your heart throughout your body. A blood pressure reading consists of a higher number over a lower number, such as 110/72. The higher number (systolic) is the pressure inside your arteries when your heart pumps. The lower number (diastolic) is the pressure inside your arteries when your heart relaxes. Ideally you want your blood pressure below 120/80. °Hypertension forces your heart to work harder to pump blood. Your arteries may become narrow or stiff. Having hypertension puts you at risk for heart disease, stroke, and other problems.  °RISK FACTORS °Some risk factors for high blood pressure are controllable. Others are not.  °Risk factors you cannot control include:  °· Race. You may be at higher risk if you are African American. °· Age. Risk increases with age. °· Gender. Men are at higher risk than women before age 45 years. After age 65, women are at higher risk than men. °Risk factors you can control include: °· Not getting enough exercise or physical activity. °· Being overweight. °· Getting too much fat, sugar, calories, or salt in your diet. °· Drinking too much alcohol. °SIGNS AND SYMPTOMS °Hypertension does not usually cause signs or symptoms. Extremely high blood pressure (hypertensive crisis) may cause headache, anxiety, shortness of breath, and nosebleed. °DIAGNOSIS  °To check if you have hypertension, your health care provider will measure your blood pressure while you are seated, with your arm held at the level of your heart. It should be measured at least twice using the same arm. Certain conditions can cause a difference in blood pressure between your right and left arms. A blood pressure reading that is higher than normal on one occasion does not mean that you need treatment. If one blood pressure reading  is high, ask your health care provider about having it checked again. °TREATMENT  °Treating high blood pressure includes making lifestyle changes and possibly taking medicine. Living a healthy lifestyle can help lower high blood pressure. You may need to change some of your habits. °Lifestyle changes may include: °· Following the DASH diet. This diet is high in fruits, vegetables, and whole grains. It is low in salt, red meat, and added sugars. °· Getting at least 2½ hours of brisk physical activity every week. °· Losing weight if necessary. °· Not smoking. °· Limiting alcoholic beverages. °· Learning ways to reduce stress. ° If lifestyle changes are not enough to get your blood pressure under control, your health care provider may prescribe medicine. You may need to take more than one. Work closely with your health care provider to understand the risks and benefits. °HOME CARE INSTRUCTIONS °· Have your blood pressure rechecked as directed by your health care provider.   °· Take medicines only as directed by your health care provider. Follow the directions carefully. Blood pressure medicines must be taken as prescribed. The medicine does not work as well when you skip doses. Skipping doses also puts you at risk for problems.   °· Do not smoke.   °· Monitor your blood pressure at home as directed by your health care provider.  °SEEK MEDICAL CARE IF:  °· You think you are having a reaction to medicines taken. °· You have recurrent headaches or feel dizzy. °· You have swelling in your ankles. °· You have trouble with your vision. °SEEK IMMEDIATE MEDICAL CARE IF: °· You develop a severe headache or confusion. °·   You have unusual weakness, numbness, or feel faint.  You have severe chest or abdominal pain.  You vomit repeatedly.  You have trouble breathing. MAKE SURE YOU:   Understand these instructions.  Will watch your condition.  Will get help right away if you are not doing well or get worse. Document  Released: 05/14/2005 Document Revised: 09/28/2013 Document Reviewed: 03/06/2013 [facility] Patient Information 2015 [place], [place]. This information is not intended to replace advice given to you by your health care provider. Make sure you discuss any questions you have with your health care provider.  Panic Attacks Panic attacks are sudden, short-livedsurges of severe anxiety, fear, or discomfort. They may occur for no reason when you are relaxed, when you are anxious, or when you are sleeping. Panic attacks may occur for a number of reasons:   Healthy people occasionally have panic attacks in extreme, life-threatening situations, such as war or natural disasters. Normal anxiety is a protective mechanism of the body that helps [place] react to danger (fight or flight response).  Panic attacks are often seen with anxiety disorders, such as panic disorder, social anxiety disorder, generalized anxiety disorder, and phobias. Anxiety disorders cause excessive or uncontrollable anxiety. They may interfere with your relationships or other life activities.  Panic attacks are sometimes seen with other mental illnesses, such as depression and posttraumatic stress disorder.  Certain medical conditions, prescription medicines, and drugs of abuse can cause panic attacks. SYMPTOMS  Panic attacks start suddenly, peak within 20 minutes, and are accompanied by four or more of the following symptoms:  Pounding heart or fast heart rate (palpitations).  Sweating.  Trembling or shaking.  Shortness of breath or feeling smothered.  Feeling choked.  Chest pain or discomfort.  Nausea or strange feeling in your stomach.  Dizziness, light-headedness, or feeling like you will faint.  Chills or hot flushes.  Numbness or tingling in your lips or hands and feet.  Feeling that things are not real or feeling that you are not yourself.  Fear of losing control or going crazy.  Fear of dying. Some of these symptoms  can mimic serious medical conditions. For example, you may think you are having a heart attack. Although panic attacks can be very scary, they are not life threatening. DIAGNOSIS  Panic attacks are diagnosed through an assessment by your health care provider. Your health care provider will ask questions about your symptoms, such as where and when they occurred. Your health care provider will also ask about your medical history and use of alcohol and drugs, including prescription medicines. Your health care provider may order blood tests or other studies to rule out a serious medical condition. Your health care provider may refer you to a mental health professional for further evaluation. TREATMENT   Most healthy people who have one or two panic attacks in an extreme, life-threatening situation will not require treatment.  The treatment for panic attacks associated with anxiety disorders or other mental illness typically involves counseling with a mental health professional, medicine, or a combination of both. Your health care provider will help determine what treatment is best for you.  Panic attacks due to physical illness usually go away with treatment of the illness. If prescription medicine is causing panic attacks, talk with your health care provider about stopping the medicine, decreasing the dose, or substituting another medicine.  Panic attacks due to alcohol or drug abuse go away with abstinence. Some adults need professional help in order to stop drinking or using drugs.  HOME CARE INSTRUCTIONS   Take all medicines as directed by your health care provider.   Schedule and attend follow-up visits as directed by your health care provider. It is important to keep all your appointments. SEEK MEDICAL CARE IF:  You are not able to take your medicines as prescribed.  Your symptoms do not improve or get worse. SEEK IMMEDIATE MEDICAL CARE IF:   You experience panic attack symptoms that are  different than your usual symptoms.  You have serious thoughts about hurting yourself or others.  You are taking medicine for panic attacks and have a serious side effect. MAKE SURE YOU:  Understand these instructions.  Will watch your condition.  Will get help right away if you are not doing well or get worse. Document Released: 05/14/2005 Document Revised: 05/19/2013 Document Reviewed: 12/26/2012 [facility] Patient Information 2015 [place], [place]. This information is not intended to replace advice given to you by your health care provider. Make sure you discuss any questions you have with your health care provider.

## 2014-06-15 NOTE — ED Notes (Signed)
Pt added that he has been having chest pain off and on for about a week   Pt states it feels like a sharpness goes through his heart every now and then

## 2014-06-15 NOTE — ED Notes (Signed)
Pt states he has been having shortness of breath for about a month and a half  Pt states he went to the drug store tonight and his blood pressure was high  Pt states he has hx of htn but has been out of his medication for a few days now

## 2014-06-17 ENCOUNTER — Emergency Department (HOSPITAL_COMMUNITY)
Admission: EM | Admit: 2014-06-17 | Discharge: 2014-06-17 | Disposition: A | Discharge status: Home / Self Care | Payer: Self-pay | Attending: Emergency Medicine | Admitting: Emergency Medicine

## 2014-06-17 ENCOUNTER — Other Ambulatory Visit: Payer: Self-pay

## 2014-06-17 ENCOUNTER — Encounter (HOSPITAL_COMMUNITY): Payer: Self-pay

## 2014-06-17 DIAGNOSIS — I10 Essential (primary) hypertension: Secondary | ICD-10-CM | POA: Insufficient documentation

## 2014-06-17 DIAGNOSIS — Z880 Allergy status to penicillin: Secondary | ICD-10-CM | POA: Insufficient documentation

## 2014-06-17 DIAGNOSIS — R002 Palpitations: Secondary | ICD-10-CM | POA: Insufficient documentation

## 2014-06-17 DIAGNOSIS — Z79899 Other long term (current) drug therapy: Secondary | ICD-10-CM | POA: Insufficient documentation

## 2014-06-17 DIAGNOSIS — F419 Anxiety disorder, unspecified: Secondary | ICD-10-CM | POA: Insufficient documentation

## 2014-06-17 DIAGNOSIS — Z720 Tobacco use: Secondary | ICD-10-CM | POA: Insufficient documentation

## 2014-06-17 LAB — BASIC METABOLIC PANEL
Anion gap: 10 (ref 5–15)
BUN: 20 mg/dL (ref 6–23)
CALCIUM: 9.4 mg/dL (ref 8.4–10.5)
CO2: 26 mmol/L (ref 19–32)
Chloride: 102 mEq/L (ref 96–112)
Creatinine, Ser: 1.37 mg/dL — ABNORMAL HIGH (ref 0.50–1.35)
GFR calc non Af Amer: 62 mL/min — ABNORMAL LOW (ref >90)
GFR, EST AFRICAN AMERICAN: 72 mL/min — AB (ref >90)
GLUCOSE: 96 mg/dL (ref 70–99)
Potassium: 3.6 mmol/L (ref 3.5–5.1)
Sodium: 138 mmol/L (ref 135–145)

## 2014-06-17 LAB — CBC WITH DIFFERENTIAL/PLATELET
BASOS PCT: 1 % (ref 0–1)
Basophils Absolute: 0.1 10*3/uL (ref 0.0–0.1)
EOS ABS: 0.2 10*3/uL (ref 0.0–0.7)
Eosinophils Relative: 3 % (ref 0–5)
HCT: 42.8 % (ref 39.0–52.0)
Hemoglobin: 15.2 g/dL (ref 13.0–17.0)
LYMPHS PCT: 54 % — AB (ref 12–46)
Lymphs Abs: 3 10*3/uL (ref 0.7–4.0)
MCH: 31.3 pg (ref 26.0–34.0)
MCHC: 35.5 g/dL (ref 30.0–36.0)
MCV: 88.2 fL (ref 78.0–100.0)
MONOS PCT: 9 % (ref 3–12)
Monocytes Absolute: 0.5 10*3/uL (ref 0.1–1.0)
Neutro Abs: 1.8 10*3/uL (ref 1.7–7.7)
Neutrophils Relative %: 33 % — ABNORMAL LOW (ref 43–77)
Platelets: 237 10*3/uL (ref 150–400)
RBC: 4.85 MIL/uL (ref 4.22–5.81)
RDW: 13.2 % (ref 11.5–15.5)
WBC: 5.4 10*3/uL (ref 4.0–10.5)

## 2014-06-17 LAB — I-STAT TROPONIN, ED: TROPONIN I, POC: 0 ng/mL (ref 0.00–0.08)

## 2014-06-17 MED ORDER — LORAZEPAM 1 MG PO TABS
1.0000 mg | ORAL_TABLET | Freq: Once | ORAL | Status: AC
  Administered 2014-06-17: 1 mg via ORAL
  Filled 2014-06-17: qty 1

## 2014-06-17 NOTE — ED Notes (Signed)
EKG COMPLETED BY MARTIN NT

## 2014-06-17 NOTE — ED Notes (Signed)
Pt complains of an irregular heartbeat for several days, he states that it flutters and feels like it stops, then he says he coughs and feels short of breath

## 2014-06-17 NOTE — ED Provider Notes (Signed)
Medical screening examination/treatment/procedure(s) were performed by non-physician practitioner and as supervising physician I was immediately available for consultation/collaboration.    EKG Normal sinus rhythm Voltage criteria for LVH Inverted T waves laterally No prior EKG for comparison   Dorie Rank, MD 06/21/14 [phone]

## 2014-06-17 NOTE — Discharge Instructions (Signed)
Take ativan as needed. Refer to attached documents for more information. Follow up with a primary care provider from the resource guide below.    Emergency Department Resource Guide 1) Find a Doctor and Pay Out of Pocket Although you won't have to find out who is covered by your insurance plan, it is a good idea to ask around and get recommendations. You will then need to call the office and see if the doctor you have chosen will accept you as a new patient and what types of options they offer for patients who are self-pay. Some doctors offer discounts or will set up payment plans for their patients who do not have insurance, but you will need to ask so you aren't surprised when you get to your appointment.  2) Contact Your Local Health Department Not all health departments have doctors that can see patients for sick visits, but many do, so it is worth a call to see if yours does. If you don't know where your local health department is, you can check in your phone book. The CDC also has a tool to help you locate your state's health department, and many state websites also have listings of all of their local health departments.  3) Find a [facility] If your illness is not likely to be very severe or complicated, you may want to try a walk in clinic. These are popping up all over the country in pharmacies, drugstores, and shopping centers. They're usually staffed by nurse practitioners or physician assistants that have been trained to treat common illnesses and complaints. They're usually fairly quick and inexpensive. However, if you have serious medical issues or chronic medical problems, these are probably not your best option.  No Primary Care Doctor: - Call Health Connect at  [phone] - they can help you locate a primary care doctor that  accepts your insurance, provides certain services, etc. - Physician Referral Service- [phone]  Chronic Pain Problems: Organization          Address  Phone   Notes  [facility]  [phone] Patients need to be referred by their primary care doctor.   Medication Assistance: Organization         Address  Phone   Notes  [facility] Medication [facility] [address] [place], [place] [address] [phone] --Must be a resident of [facility] -- Must have NO insurance coverage whatsoever (no Medicaid/ Medicare, etc.) -- The pt. MUST have a primary care doctor that directs their care regularly and follows them in the community   MedAssist  [phone]   [employer]  [phone]    Agencies that provide inexpensive medical care: Organization         Address  Phone   Notes  [place]  [phone]   [facility]    [phone]   [facility] [place], [place] [address] [phone]   [place] [address] [place] [phone]   Planned Parenthood    [phone]   [facility]    [phone]   [place] and [place] [address], [place] Phone:  [phone], Fax:  [phone] Hours of Operation:  9 am - 6 pm, M-F.  Also accepts Medicaid/Medicare and self-pay.  [facility] for [place] [address], [employer] Phone: [phone], Fax: [phone]. Hours of Operation:  8:30 am - 5:30 pm, M-F.  Also accepts Medicaid and self-pay.  [facility] [place] [address], [place] Phone: [phone]   [place], [place], [place] [phone], Ext. [phone] Mondays & Thursdays: 7-9 AM.  First 15 patients are seen on a first come, first serve basis.    [place] Providers:  Organization         Address  Phone   Notes  [facility] [address], [place] [phone] Also accepts self-pay patients.  [facility] [address], [place]  [phone]   [place], [address], [place] [phone]   [facility] [address] [place] [phone]   Lucianne Lei [address], [place]   [phone] Only accepts [place] Access [place] patients after they have their name applied to their card.   Self-Pay (no insurance) in [facility]:  Organization         Address  Phone   Notes  Sickle Cell Patients, [facility] [place] [phone]   [facility]gent Care [place] [phone]   [facility] [place]  [place], [place], [place] [phone]   [facility]/Dr. Osei-Bonsu  [address] [place] or [address], [place] [phone] Phone number for both [place] and [place] locations is the same.  Urgent Medical and [facility] [address], [place] [phone]   [facility] [address] [place] or [address] [phone]   [facility] [address] [place] [phone], phone; [phone], fax Sees patients 1st and 3rd Saturday of every month.  Must not qualify for public or private insurance (i.e. Medicaid, Medicare, [place] Health Choice, Veterans' Benefits)  Household income should be no more than 200% of the poverty level The clinic cannot treat you if you are pregnant or think you are pregnant  Sexually transmitted diseases are not treated at the clinic.    Dental Care: Organization         Address  Phone  Notes  [facility] Department of [facility] [place] [phone] Accepts children up to age 28 who are enrolled in [place] or [place]; pregnant women with a Medicaid card; and  children who have applied for Medicaid or [place] Health Choice, but were declined, whose parents can pay a reduced fee at time of service.  [facility] Department of [facility]  [address], [place] [phone] Accepts children up to age 15 who are enrolled in [place] or [place]; pregnant women with a Medicaid card; and children who have applied for Medicaid or [place] Health Choice, but were declined, whose parents can pay a reduced fee at time of service.  [place] Adult Dental Access PROGRAM  [place] [phone] Patients are seen by appointment only. Walk-ins are not accepted. [place] will see patients 11 years of age and older. Monday - Tuesday (8am-5pm) Most Wednesdays (8:30-5pm) $30 per visit, cash only  [place] Adult [facility] Access PROGRAM  [address],  [place] [phone] Patients are seen by appointment only. Walk-ins are not accepted. [place] will see patients 54 years of age and older. One Wednesday Evening (Monthly: Volunteer Based).  $30 per visit, cash only  [place]  [phone] for adults; Children under age 55, call Graduate Pediatric Dentistry at [phone]. Children aged 23-14, please call [phone] to request a pediatric application.  Dental services are provided in all areas of dental care including fillings, crowns and bridges, complete and partial dentures, implants, gum treatment, root canals, and extractions. Preventive care is also provided. Treatment is provided to both adults and children. Patients are selected via a lottery and there is often a waiting list.   [facility] [address] [place]  [phone] [email or web address]   [facility] [address] [place], [place] [phone], Ext. [phone] Second and Fourth Thursday of each month, opens at 6:30 AM; Clinic ends at 9 AM.  Patients are seen on a first-come first-served  basis, and a limited number are seen during each clinic.   [facility]  [address] [place], [place] [phone]   Eligibility Requirements You must have lived in [place], [place], or [place] counties for at least the last three months.   You cannot be eligible for state or federal sponsored [employer], including [employer], [place], or [employer].   You generally cannot be eligible for healthcare insurance through your employer.    How to apply: Eligibility screenings are held every Tuesday and Wednesday afternoon from 1:00 pm until 4:00 pm. You do not need an appointment for the interview!  [facility] [address] [place], [place]   [place]  [place] Department  [place]  [phone]    Behavioral Health Resources in the Community: Intensive Outpatient Programs Organization         Address  Phone  Notes  [place]. [address] [place], [place] [phone]   [facility] Outpatient [address], [place], [place]   ADS: [facility] [address], [place], [place]   [place] [address],  [place], [place] or [phone]   Substance Abuse Resources Organization         Address  Phone  Notes  Alcohol and Drug Services  [phone]   [place]  [phone]   [facility] [place]   Chinita Pester  [phone]   Residential & Outpatient Substance Abuse Program  [phone]   Psychological Services Organization         Address  Phone  Notes  [facility] [place]  [place]  [phone]   [place] [address], [place] [employer] or [phone]    Mobile Crisis Teams Organization          Address  Phone  Notes  [facility], Mobile Crisis Care Unit  [phone]   [facility]  [address] [place], [place]   [place] Levels [address], [place] [phone]    Self-Help/Support Groups Organization         Address  Phone             Notes  [place]. of [place] - variety of support  groups  [phone] Call for more information  Narcotics Anonymous (NA), [facility] [address], [employer]   2 meetings at this location   Residential Facilities manager         Address  Phone  Notes  [facility] [place],    [place]  [phone]   [facility]  [address], [place] [phone], [place], [place]   [place], [place] [phone] Admissions: 8am-3pm M-F  Incentives Substance [place] [phone]    [place], [place] [phone]   [facility] [address] [place], [place], [place]   [facility] [address]  [place], [place]   [facility] Programs - Intensive Outpatient [address], [place], [place]   [facility] [address]  [place], [place] [phone] or [phone]   [facility] (RTS) [address] [place], [place] Accepts Medicaid  [facility] [place] [address]  [place] [phone] Substance Abuse/Addiction Treatment   [facility] The Organization         Address  Phone  Notes  [facility]  [phone]   Domenic Schwab, PhD [address] Arlis [place], [place]   [phone] or [phone]   [place], [place] [phone]   [facility] [address] [place], [place] [phone] Insurance/Medicaid/sponsorship  through [facility] and Families [address]                                    [place], [place] [phone] [place] [address], [place] [phone]    Dr. Adele Schilder  [phone]   [facility] Dept. 1) [address] [place] 2) [place] 3)  [place] [address], [place] [phone]  [phone]   [place] [phone] or [phone] (After Hours)

## 2014-06-17 NOTE — ED Provider Notes (Signed)
CSN: [id number]     Arrival date & time 06/17/14  2129 History   First MD Initiated Contact with Patient 06/17/14 2142     Chief Complaint  Patient presents with  . Irregular Heart Beat     (Consider location/radiation/quality/duration/timing/severity/associated sxs/prior Treatment) Patient is a 44 y.o. male presenting with palpitations. The history is provided by the patient. No language interpreter was used.  Palpitations Palpitations quality:  Fast Onset quality:  At rest Duration:  5 seconds Timing:  Sporadic Progression:  Worsening Chronicity:  New Context: anxiety   Context: not appetite suppressants, not bronchodilators, not caffeine, not exercise, not hyperventilation, not illicit drugs, not nicotine and not stimulant use   Relieved by:  Nothing Worsened by:  Nothing tried Ineffective treatments:  None tried Associated symptoms: no chest pain, no dizziness, no nausea, no shortness of breath and no vomiting   Risk factors: stress   Risk factors: no diabetes mellitus, no heart disease, no hx of atrial fibrillation, no hx of DVT, no hx of PE, no hx of thyroid disease, no hypercoagulable state, no hyperthyroidism and no OTC sinus medications     Past Medical History  Diagnosis Date  . Hypertension    Past Surgical History  Procedure Laterality Date  . Arm surgery      torn bicept   Family History  Problem Relation Age of Onset  . Hypertension Mother   . Hypertension Father   . Cancer Other    History  Substance Use Topics  . Smoking status: Current Some Day Smoker    Types: Cigarettes  . Smokeless tobacco: Not on file  . Alcohol Use: Yes     Comment: occ    Review of Systems  Constitutional: Negative for fever, chills and fatigue.  HENT: Negative for trouble swallowing.   Eyes: Negative for visual disturbance.  Respiratory: Negative for shortness of breath.   Cardiovascular: Positive for palpitations. Negative for chest pain.  Gastrointestinal: Negative  for nausea, vomiting, abdominal pain and diarrhea.  Genitourinary: Negative for dysuria and difficulty urinating.  Musculoskeletal: Negative for arthralgias and neck pain.  Skin: Negative for color change.  Neurological: Negative for dizziness and weakness.  Psychiatric/Behavioral: Negative for dysphoric mood.      Allergies  Penicillins and Sulfa antibiotics  Home Medications   Prior to Admission medications   Medication Sig Start Date End Date Taking? Authorizing Provider  aspirin-acetaminophen-caffeine (EXCEDRIN MIGRAINE) [phone] MG per tablet Take 2 tablets by mouth every 6 (six) hours as needed for headache.    Historical Provider, MD  Guaifenesin 1200 MG TB12 Take 1 tablet (1,200 mg total) by mouth 2 (two) times daily. Patient not taking: Reported on 06/15/2014 01/26/14   Resa Miner Lawyer, PA-C  hydrochlorothiazide (HYDRODIURIL) 25 MG tablet Take 25 mg by mouth daily.    Historical Provider, MD  ibuprofen (ADVIL,MOTRIN) 800 MG tablet Take 1 tablet (800 mg total) by mouth every 8 (eight) hours as needed. Patient not taking: Reported on 06/15/2014 01/26/14   Resa Miner Lawyer, PA-C  lisinopril-hydrochlorothiazide (PRINZIDE,ZESTORETIC) 10-12.5 MG per tablet Take 1 tablet by mouth daily. 06/15/14   Jasper Riling. Pickering, MD  LORazepam (ATIVAN) 1 MG tablet Take 1 tablet (1 mg total) by mouth 3 (three) times daily as needed for anxiety. 06/15/14   Jasper Riling. Pickering, MD  methocarbamol (ROBAXIN) 500 MG tablet Take 1 tablet (500 mg total) by mouth 2 (two) times daily. Patient not taking: Reported on 06/15/2014 02/24/14   Domenic Moras, PA-C  naproxen (NAPROSYN)  500 MG tablet Take 1 tablet (500 mg total) by mouth 2 (two) times daily. Patient not taking: Reported on 06/15/2014 02/24/14   Domenic Moras, PA-C  predniSONE (DELTASONE) 50 MG tablet Take 1 tablet (50 mg total) by mouth daily. Patient not taking: Reported on 06/15/2014 01/26/14   Resa Miner Lawyer, PA-C  promethazine-dextromethorphan  (PROMETHAZINE-DM) 6.25-15 MG/5ML syrup Take 5 mLs by mouth 4 (four) times daily as needed for cough. Patient not taking: Reported on 06/15/2014 01/26/14   Resa Miner Lawyer, PA-C  traMADol-acetaminophen (ULTRACET) 37.5-325 MG per tablet Take 1 tablet by mouth every 6 (six) hours as needed. Patient not taking: Reported on 06/15/2014 01/14/14   Larene Pickett, PA-C   There were no vitals taken for this visit. Physical Exam  Constitutional: He is oriented to person, place, and time. He appears well-developed and well-nourished. No distress.  HENT:  Head: Normocephalic and atraumatic.  Eyes: Conjunctivae and EOM are normal.  Neck: Normal range of motion.  Cardiovascular: Normal rate and regular rhythm.  Exam reveals no gallop and no friction rub.   No murmur heard. Pulmonary/Chest: Effort normal and breath sounds normal. He has no wheezes. He has no rales. He exhibits no tenderness.  Abdominal: Soft. He exhibits no distension. There is no tenderness. There is no rebound.  Musculoskeletal: Normal range of motion.  Neurological: He is alert and oriented to person, place, and time. Coordination normal.  Speech is goal-oriented. Moves limbs without ataxia.   Skin: Skin is warm and dry.  Psychiatric: He has a normal mood and affect. His behavior is normal.  Nursing note and vitals reviewed.   ED Course  Procedures (including critical care time) Labs Review Labs Reviewed - No data to display  Imaging Review Dg Chest Port 1 View  06/15/2014   CLINICAL DATA:  Shortness of breath and hypertension  EXAM: PORTABLE CHEST - 1 VIEW  COMPARISON:  01/26/2014  FINDINGS: Heart size is normal. Increased prominence of the ascending aorta is likely from slight rightward rotation. The hila are negative. No acute infiltrate or edema. No effusion or pneumothorax. No acute osseous findings.  IMPRESSION: No active disease.   Electronically Signed   By: Jorje Guild M.D.   On: 06/15/2014 22:26     EKG  Interpretation None      MDM   Final diagnoses:  Palpitations    10:16 PM Labs and troponin pending. Vitals stable and patient afebrile.   11:18 PM Labs and troponin unremarkable. Patient likely having palpitations due to anxiety. Patient has ativan rx at home. Patient given resource guide for PCP follow up. Vitals stable and patient afebrile. Patient instructed to return with worsening or concerning symptoms.   Alvina Chou, PA-C 06/17/14 2343  Dorie Rank, MD 06/21/14 [phone]

## 2014-07-13 ENCOUNTER — Encounter (HOSPITAL_COMMUNITY): Payer: Self-pay | Admitting: Emergency Medicine

## 2014-07-13 ENCOUNTER — Emergency Department (HOSPITAL_COMMUNITY)
Admission: EM | Admit: 2014-07-13 | Discharge: 2014-07-13 | Discharge status: Against Medical Advice | Payer: Self-pay | Attending: Emergency Medicine | Admitting: Emergency Medicine

## 2014-07-13 ENCOUNTER — Emergency Department (HOSPITAL_COMMUNITY): Payer: Self-pay

## 2014-07-13 DIAGNOSIS — R079 Chest pain, unspecified: Secondary | ICD-10-CM

## 2014-07-13 DIAGNOSIS — R0609 Other forms of dyspnea: Secondary | ICD-10-CM

## 2014-07-13 DIAGNOSIS — I10 Essential (primary) hypertension: Secondary | ICD-10-CM | POA: Insufficient documentation

## 2014-07-13 DIAGNOSIS — Z79899 Other long term (current) drug therapy: Secondary | ICD-10-CM | POA: Insufficient documentation

## 2014-07-13 DIAGNOSIS — R0789 Other chest pain: Secondary | ICD-10-CM | POA: Insufficient documentation

## 2014-07-13 DIAGNOSIS — Z880 Allergy status to penicillin: Secondary | ICD-10-CM | POA: Insufficient documentation

## 2014-07-13 DIAGNOSIS — Z720 Tobacco use: Secondary | ICD-10-CM | POA: Insufficient documentation

## 2014-07-13 LAB — CBC WITH DIFFERENTIAL/PLATELET
BASOS ABS: 0 10*3/uL (ref 0.0–0.1)
BASOS PCT: 1 % (ref 0–1)
Eosinophils Absolute: 0.1 10*3/uL (ref 0.0–0.7)
Eosinophils Relative: 3 % (ref 0–5)
HEMATOCRIT: 38 % — AB (ref 39.0–52.0)
HEMOGLOBIN: 13.1 g/dL (ref 13.0–17.0)
LYMPHS ABS: 2.5 10*3/uL (ref 0.7–4.0)
Lymphocytes Relative: 43 % (ref 12–46)
MCH: 30.5 pg (ref 26.0–34.0)
MCHC: 34.5 g/dL (ref 30.0–36.0)
MCV: 88.4 fL (ref 78.0–100.0)
MONO ABS: 0.5 10*3/uL (ref 0.1–1.0)
Monocytes Relative: 9 % (ref 3–12)
NEUTROS ABS: 2.4 10*3/uL (ref 1.7–7.7)
NEUTROS PCT: 44 % (ref 43–77)
Platelets: 219 10*3/uL (ref 150–400)
RBC: 4.3 MIL/uL (ref 4.22–5.81)
RDW: 12.7 % (ref 11.5–15.5)
WBC: 5.5 10*3/uL (ref 4.0–10.5)

## 2014-07-13 LAB — I-STAT CHEM 8, ED
BUN: 15 mg/dL (ref 6–23)
Calcium, Ion: 1.21 mmol/L (ref 1.12–1.23)
Chloride: 104 mmol/L (ref 96–112)
Creatinine, Ser: 1 mg/dL (ref 0.50–1.35)
Glucose, Bld: 99 mg/dL (ref 70–99)
HEMATOCRIT: 38 % — AB (ref 39.0–52.0)
Hemoglobin: 12.9 g/dL — ABNORMAL LOW (ref 13.0–17.0)
Potassium: 3.6 mmol/L (ref 3.5–5.1)
SODIUM: 141 mmol/L (ref 135–145)
TCO2: 20 mmol/L (ref 0–100)

## 2014-07-13 LAB — BRAIN NATRIURETIC PEPTIDE: B Natriuretic Peptide: 13.7 pg/mL (ref 0.0–100.0)

## 2014-07-13 LAB — I-STAT TROPONIN, ED: Troponin i, poc: 0 ng/mL (ref 0.00–0.08)

## 2014-07-13 MED ORDER — HYDROCHLOROTHIAZIDE 12.5 MG PO TABS
12.5000 mg | ORAL_TABLET | Freq: Every day | ORAL | Status: DC
Start: 2014-07-13 — End: 2015-01-23

## 2014-07-13 MED ORDER — ASPIRIN 81 MG PO CHEW: 81.0000 mg | CHEWABLE_TABLET | Freq: Every day | ORAL | Status: DC

## 2014-07-13 MED ORDER — NITROGLYCERIN 0.4 MG SL SUBL
0.4000 mg | SUBLINGUAL_TABLET | SUBLINGUAL | Status: DC | PRN
  Filled 2014-07-13: qty 1

## 2014-07-13 MED ORDER — ASPIRIN 81 MG PO CHEW
324.0000 mg | CHEWABLE_TABLET | Freq: Once | ORAL | Status: AC
  Administered 2014-07-13: 324 mg via ORAL
  Filled 2014-07-13: qty 4

## 2014-07-13 NOTE — ED Provider Notes (Signed)
CSN: [id number]     Arrival date & time 07/13/14  6222 History   None    Chief Complaint  Patient presents with  . Chest Pain     (Consider location/radiation/quality/duration/timing/severity/associated sxs/prior Treatment) HPI   Ronald [name] is a 44 y.o. male complaining of nonradiating, left  sided CP described as pressure-like and intermittent over the last several day associated with dyspnea on exertion and palpitations. These episodes last for seconds to minutes. Patient states that this may be exacerbated by anxiety. Patient states he's been out of his blood pressure medications for several days and this is causing him to be anxious. Patient denies nausea, vomiting, diaphoresis, fever, recent cocaine or methamphetamine abuse, history of DVT or PE, recent travel, leg swelling, hemoptysis.  He reports a dry cough. Patient is not received any aspirin or nitroglycerin in the last 24 hours.  RF: 10-pack-year history, current smoker, history of hypertension, family history of cardiac disease with grandfather dying of CHF at age 29s Last Stress test: ? Cardiologost: PCP: ?    Past Medical History  Diagnosis Date  . Hypertension    Past Surgical History  Procedure Laterality Date  . Arm surgery      torn bicept   Family History  Problem Relation Age of Onset  . Hypertension Mother   . Hypertension Father   . Cancer Other    History  Substance Use Topics  . Smoking status: Current Some Day Smoker    Types: Cigarettes  . Smokeless tobacco: Not on file  . Alcohol Use: Yes     Comment: occ    Review of Systems  10 systems reviewed and found to be negative, except as noted in the HPI.  Allergies  Penicillins and Sulfa antibiotics  Home Medications   Prior to Admission medications   Medication Sig Start Date End Date Taking? Authorizing Provider  aspirin-acetaminophen-caffeine (EXCEDRIN MIGRAINE) [phone] MG per tablet Take 2 tablets by mouth every 6 (six) hours as  needed for headache.   Yes Historical Provider, MD  lisinopril-hydrochlorothiazide (PRINZIDE,ZESTORETIC) 10-12.5 MG per tablet Take 1 tablet by mouth daily. 06/15/14  Yes Nathan R. Pickering, MD  LORazepam (ATIVAN) 1 MG tablet Take 1 tablet (1 mg total) by mouth 3 (three) times daily as needed for anxiety. 06/15/14  Yes Nathan R. Pickering, MD  Guaifenesin 1200 MG TB12 Take 1 tablet (1,200 mg total) by mouth 2 (two) times daily. Patient not taking: Reported on 06/15/2014 01/26/14   Resa Miner Lawyer, PA-C  ibuprofen (ADVIL,MOTRIN) 800 MG tablet Take 1 tablet (800 mg total) by mouth every 8 (eight) hours as needed. Patient not taking: Reported on 06/15/2014 01/26/14   Resa Miner Lawyer, PA-C  methocarbamol (ROBAXIN) 500 MG tablet Take 1 tablet (500 mg total) by mouth 2 (two) times daily. Patient not taking: Reported on 06/15/2014 02/24/14   Domenic Moras, PA-C  naproxen (NAPROSYN) 500 MG tablet Take 1 tablet (500 mg total) by mouth 2 (two) times daily. Patient not taking: Reported on 06/15/2014 02/24/14   Domenic Moras, PA-C  predniSONE (DELTASONE) 50 MG tablet Take 1 tablet (50 mg total) by mouth daily. Patient not taking: Reported on 06/15/2014 01/26/14   Resa Miner Lawyer, PA-C  promethazine-dextromethorphan (PROMETHAZINE-DM) 6.25-15 MG/5ML syrup Take 5 mLs by mouth 4 (four) times daily as needed for cough. Patient not taking: Reported on 06/15/2014 01/26/14   Resa Miner Lawyer, PA-C  traMADol-acetaminophen (ULTRACET) 37.5-325 MG per tablet Take 1 tablet by mouth every 6 (six) hours as needed.  Patient not taking: Reported on 06/15/2014 01/14/14   Larene Pickett, PA-C   BP 130/96 mmHg  Pulse 72  Temp(Src) 98.2 F (36.8 C) (Oral)  Resp 18  Ht 5\' 9"  (1.753 m)  Wt 190 lb (86.183 kg)  BMI 28.05 kg/m2  SpO2 96% Physical Exam  Constitutional: He is oriented to person, place, and time. He appears well-developed and well-nourished. No distress.  HENT:  Head: Normocephalic and atraumatic.  Mouth/Throat:  Oropharynx is clear and moist.  Eyes: Conjunctivae and EOM are normal. Pupils are equal, round, and reactive to light.  Neck: Normal range of motion. No JVD present.  Cardiovascular: Normal rate, regular rhythm and intact distal pulses.   Pulmonary/Chest: Effort normal and breath sounds normal. No stridor. No respiratory distress. He has no wheezes. He has no rales. He exhibits no tenderness.  Abdominal: Soft. Bowel sounds are normal. He exhibits no distension and no mass. There is no tenderness. There is no rebound and no guarding.  Musculoskeletal: Normal range of motion. He exhibits no edema or tenderness.  No calf asymmetry, superficial collaterals, palpable cords, edema, Homans sign negative bilaterally.    Neurological: He is alert and oriented to person, place, and time.  Psychiatric: He has a normal mood and affect.  Nursing note and vitals reviewed.   ED Course  Procedures (including critical care time) Labs Review Labs Reviewed  CBC WITH DIFFERENTIAL/PLATELET - Abnormal; Notable for the following:    HCT 38.0 (*)    All other components within normal limits  I-STAT CHEM 8, ED - Abnormal; Notable for the following:    Hemoglobin 12.9 (*)    HCT 38.0 (*)    All other components within normal limits  BRAIN NATRIURETIC PEPTIDE  I-STAT TROPOININ, ED    Imaging Review Dg Chest 2 View  07/13/2014   CLINICAL DATA:  Cough, congestion, shortness of breath and mid chest pain for 3-4 days.  EXAM: CHEST  2 VIEW  COMPARISON:  Chest radiograph June 15, 2014  FINDINGS: Cardiomediastinal silhouette is unremarkable. The lungs are clear without pleural effusions or focal consolidations. Trachea projects midline and there is no pneumothorax. Soft tissue planes and included osseous structures are non-suspicious.  IMPRESSION: Normal chest.   Electronically Signed   By: Elon Alas   On: 07/13/2014 05:19     EKG Interpretation   Date/Time:  Tuesday July 13 2014 04:26:19  EST Ventricular Rate:  83 PR Interval:  160 QRS Duration: 97 QT Interval:  382 QTC Calculation: 449 R Axis:   74 Text Interpretation:  Sinus rhythm Borderline T wave abnormalities  Confirmed by Glynn Octave [phone]) on 07/13/2014 4:32:51 AM      MDM   Final diagnoses:  Chest pain, unspecified chest pain type  Dyspnea on exertion    Filed Vitals:   07/13/14 0456 07/13/14 0600 07/13/14 0600 07/13/14 0630  BP: 133/75 130/96 130/96 141/92  Pulse: 75 69 72 66  Temp:      TempSrc:      Resp: 18 20 18 19   Height:      Weight:      SpO2: 98% 94% 96% 96%    Medications  nitroGLYCERIN (NITROSTAT) SL tablet 0.4 mg (not administered)  aspirin chewable tablet 324 mg (324 mg Oral Given 07/13/14 0650)    Ronald [name] is a pleasant 44 y.o. male presenting with intermittent fleeting chest pressure associated with palpitations and dyspnea on exertion. Patient is low risk by heart score. He also low risk  by Wells score and PERC negative. EKG with no acute abnormalities. Troponin is negative, chest x-rays without infiltrate or cardiomegaly. Blood work is normal, but pro BNP has not resulted, patient states that he needs to leave to pick up his children, I would like to obtain a delta troponin however patient declines, I have explained to him that he risks death or disability, and explained to him that he can return to the ED at anytime. Patient is alert and oriented, he does not appear intoxicated and has capacity for medical decision making and understands the risks. All questions answered to the best of my ability patient is given outpatient treatment of hydrochlorothiazide and daily aspirin, I have encouraged him to follow closely with the [facility] for blood pressure management and also cardiology for possible stress testing.   Evaluation does not show pathology that would require ongoing emergent intervention or inpatient treatment. Pt is hemodynamically stable and mentating  appropriately. Discussed findings and plan with patient/guardian, who agrees with care plan. All questions answered. Return precautions discussed and outpatient follow up given.   New Prescriptions   ASPIRIN 81 MG CHEWABLE TABLET    Chew 1 tablet (81 mg total) by mouth daily.   HYDROCHLOROTHIAZIDE (HYDRODIURIL) 12.5 MG TABLET    Take 1 tablet (12.5 mg total) by mouth daily.         Monico Blitz, PA-C 07/13/14 [id number]  Debby Freiberg, MD 07/16/14 2056

## 2014-07-13 NOTE — ED Notes (Signed)
Awake. Verbally responsive. A/O x4. Resp even and unlabored. No audible adventitious breath sounds noted. ABC's intact. SR on monitor at 72bpm

## 2014-07-13 NOTE — ED Notes (Signed)
Pt reported having lt sided chest pain, SHOB, diaphoresis, and (-)N/V.  Pt reported occ dry cough. Pt stated that he just moved and was unable to find his BP medication (HTZD/Lisinopril) and has not taken it in past 4 days.

## 2014-07-13 NOTE — Discharge Instructions (Signed)
Ronald [name] would leaving the emergency room [facility], as we have discussed this may result in your death or permanent disability. I hope you change your mind and return to the emergency room at your earliest convenience.   Do not hesitate to return to the emergency room for any new, worsening or concerning symptoms.  Please obtain primary care using resource guide below. But the minute you were seen in the emergency room and that they will need to obtain records for further outpatient management.    Chest Pain (Nonspecific) It is often hard to give a specific diagnosis for the cause of chest pain. There is always a chance that your pain could be related to something serious, such as a heart attack or a blood clot in the lungs. You need to follow up with your health care provider for further evaluation. CAUSES   Heartburn.  Pneumonia or bronchitis.  Anxiety or stress.  Inflammation around your heart (pericarditis) or lung (pleuritis or pleurisy).  A blood clot in the lung.  A collapsed lung (pneumothorax). It can develop suddenly on its own (spontaneous pneumothorax) or from trauma to the chest.  Shingles infection (herpes zoster virus). The chest wall is composed of bones, muscles, and cartilage. Any of these can be the source of the pain.  The bones can be bruised by injury.  The muscles or cartilage can be strained by coughing or overwork.  The cartilage can be affected by inflammation and become sore (costochondritis). DIAGNOSIS  Lab tests or other studies may be needed to find the cause of your pain. Your health care provider may have you take a test called an ambulatory electrocardiogram (ECG). An ECG records your heartbeat patterns over a 24-hour period. You may also have other tests, such as:  Transthoracic echocardiogram (TTE). During echocardiography, sound waves are used to evaluate how blood flows through your heart.  Transesophageal echocardiogram  (TEE).  Cardiac monitoring. This allows your health care provider to monitor your heart rate and rhythm in real time.  Holter monitor. This is a portable device that records your heartbeat and can help diagnose heart arrhythmias. It allows your health care provider to track your heart activity for several days, if needed.  Stress tests by exercise or by giving medicine that makes the heart beat faster. TREATMENT   Treatment depends on what may be causing your chest pain. Treatment may include:  Acid blockers for heartburn.  Anti-inflammatory medicine.  Pain medicine for inflammatory conditions.  Antibiotics if an infection is present.  You may be advised to change lifestyle habits. This includes stopping smoking and avoiding alcohol, caffeine, and chocolate.  You may be advised to keep your head raised (elevated) when sleeping. This reduces the chance of acid going backward from your stomach into your esophagus. Most of the time, nonspecific chest pain will improve within 2-3 days with rest and mild pain medicine.  HOME CARE INSTRUCTIONS   If antibiotics were prescribed, take them as directed. Finish them even if you start to feel better.  For the next few days, avoid physical activities that bring on chest pain. Continue physical activities as directed.  Do not use any tobacco products, including cigarettes, chewing tobacco, or electronic cigarettes.  Avoid drinking alcohol.  Only take medicine as directed by your health care provider.  Follow your health care provider's suggestions for further testing if your chest pain does not go away.  Keep any follow-up appointments you made. If you do not go to an  appointment, you could develop lasting (chronic) problems with pain. If there is any problem keeping an appointment, call to reschedule. SEEK MEDICAL CARE IF:   Your chest pain does not go away, even after treatment.  You have a rash with blisters on your chest.  You have  a fever. SEEK IMMEDIATE MEDICAL CARE IF:   You have increased chest pain or pain that spreads to your arm, neck, jaw, back, or abdomen.  You have shortness of breath.  You have an increasing cough, or you cough up blood.  You have severe back or abdominal pain.  You feel nauseous or vomit.  You have severe weakness.  You faint.  You have chills. This is an emergency. Do not wait to see if the pain will go away. Get medical help at once. Call your local emergency services (911 in U.S.). Do not drive yourself to the hospital. MAKE SURE YOU:   Understand these instructions.  Will watch your condition.  Will get help right away if you are not doing well or get worse. Document Released: 02/21/2005 Document Revised: 05/19/2013 Document Reviewed: 12/18/2007 [facility] Patient Information 2015 [place], [place]. This information is not intended to replace advice given to you by your health care provider. Make sure you discuss any questions you have with your health care provider.   Emergency Department Resource Guide 1) Find a Doctor and Pay Out of Pocket Although you won't have to find out who is covered by your insurance plan, it is a good idea to ask around and get recommendations. You will then need to call the office and see if the doctor you have chosen will accept you as a new patient and what types of options they offer for patients who are self-pay. Some doctors offer discounts or will set up payment plans for their patients who do not have insurance, but you will need to ask so you aren't surprised when you get to your appointment.  2) Contact Your Local Health Department Not all health departments have doctors that can see patients for sick visits, but many do, so it is worth a call to see if yours does. If you don't know where your local health department is, you can check in your phone book. The CDC also has a tool to help you locate your state's health department, and many state  websites also have listings of all of their local health departments.  3) Find a [facility] If your illness is not likely to be very severe or complicated, you may want to try a walk in clinic. These are popping up all over the country in pharmacies, drugstores, and shopping centers. They're usually staffed by nurse practitioners or physician assistants that have been trained to treat common illnesses and complaints. They're usually fairly quick and inexpensive. However, if you have serious medical issues or chronic medical problems, these are probably not your best option.  No Primary Care Doctor: - Call Health Connect at  [phone] - they can help you locate a primary care doctor that  accepts your insurance, provides certain services, etc. - Physician Referral Service- [phone]  Chronic Pain Problems: Organization         Address  Phone   Notes  [facility]  [phone] Patients need to be referred by their primary care doctor.   Medication Assistance: Organization         Address  Phone   Notes  [facility] Medication Assistance Program [phone] [address] [place], [place] [address] [phone] --Must be a resident of [facility] -- Must have NO insurance coverage whatsoever (no Medicaid/ Medicare, etc.) -- The pt. MUST have a primary care doctor that directs their care regularly and follows them in the community   MedAssist  5178019991   [employer]  ([phone]    Agencies that provide inexpensive medical care: Organization         Address  Phone   Notes  [place]  [phone]   [facility]    [phone]   [facility] [place], [place] [address] [phone]   [place] [address], [place] [phone]   Planned Parenthood    [phone]   [facility]    [phone]   [place] and [place] [address], [place] Phone:  [phone], Fax:  [phone] Hours of Operation:  9 am - 6 pm, M-F.  Also accepts Medicaid/Medicare and self-pay.  [facility] for [place], [address], [place] Phone: [phone], Fax: [phone]. Hours of Operation:  8:30 am - 5:30 pm, M-F.  Also accepts Medicaid and self-pay.  [facility] [place] [address] [place] Phone: [phone]   [place], [place], [place] [phone], Ext. [phone] Mondays & Thursdays: 7-9 AM.  First 15 patients are seen on a first come, first serve basis.    [place] Providers:  Organization         Address  Phone   Notes  [facility] [address],  [phone] Also accepts self-pay patients.  [facility] [address], [place]  [phone]   [place], [address], [place] [phone]   [facility] [address] [place] [phone]   Lucianne Lei [address], [place]   [phone] Only accepts [place] Access [place] patients after they have their name applied to their card.   Self-Pay (no insurance) in [facility]:  Organization         Address  Phone   Notes  Sickle Cell Patients, [facility] [place] [phone]   [facility] [place] [phone]   [facility] [place]  [place], [place], [place] [phone]   [facility]/Dr. Osei-Bonsu  [address] [place] or [address], [place] [phone] Phone number for both [place] and [place] locations is the same.  Urgent Medical and [facility] [address], [place] [phone]   [facility] [address] [place] or [address] [phone]   [facility] [address], [place] [phone], phone; [phone], fax Sees patients 1st and 3rd Saturday of every month.  Must not qualify for public or private insurance (i.e. Medicaid, Medicare, [place] Health Choice, Veterans' Benefits)  Household income should be no more than 200% of the poverty level The clinic cannot treat you if you are pregnant or think you are pregnant  Sexually transmitted diseases are not treated at the clinic.  Dental Care: Organization         Address  Phone  Notes  [facility] Department of [facility] [place] [phone] Accepts children up to age 105 who are enrolled in [place] or [place]; pregnant women with a Medicaid card; and children who have applied for Medicaid or [place] Health Choice, but were declined, whose parents can pay a reduced fee at time of service.  [facility] Department of [facility]  [address], [place] [phone] Accepts children up to age 67 who are enrolled in [place] or [place]; pregnant women with a Medicaid card; and children who have applied for Medicaid or [place] Health Choice, but were declined, whose parents can pay a reduced fee at time of service.  [place] Adult Dental Access PROGRAM  [place] [phone] Patients are seen by appointment only. Walk-ins are not accepted. [place] will see patients 53 years of age and older. Monday - Tuesday (8am-5pm) Most Wednesdays (8:30-5pm) $30 per visit, cash only  [facility] Adult Dental Access PROGRAM  [address], [facility] [place] [phone] Patients are seen by appointment only. Walk-ins are not accepted. [place] will see patients 77 years of age and older. One Wednesday Evening (Monthly: Volunteer Based).  $30 per visit, cash only  [place]  [phone] for adults; Children under age 101, call Graduate Pediatric Dentistry at [phone]. Children aged 28-14, please call [phone] to request a pediatric application.  Dental services are provided in all areas of dental care including fillings, crowns and bridges, complete and partial dentures, implants, gum treatment, root canals, and extractions. Preventive care is also provided. Treatment is provided to both adults and children. Patients are selected via a lottery and there is often a waiting list.   [facility] [address], [place]  [phone] [email or web address]   [facility] [address] [place], [place] [phone], Ext. [phone] Second and Fourth Thursday of each month, opens at 6:30 AM; Clinic ends at 9 AM.  Patients are seen on a first-come first-served basis, and a limited number are seen during each clinic.   [facility]  [address] Hillard Danker [place], [place] [phone]   Eligibility Requirements You must have lived in [place], [place], or [place] counties for at least the last three months.   You cannot be eligible for state or federal sponsored [employer], including [employer], [place], or [employer].   You generally cannot be eligible for healthcare insurance through your employer.    How to apply: Eligibility screenings are held every Tuesday and Wednesday afternoon from 1:00 pm until 4:00 pm. You do not need an appointment for the interview!  [facility] [address], [place], [place]   [place]  [place] Department  [place]  [phone]    Behavioral Health Resources in the Community: Intensive Outpatient Programs Organization         Address  Phone  Notes  [place] [address] [place], [place]  [phone]   [facility] [address], [place], [place]   ADS: Alcohol & Drug Svcs [address], [place], [place]   [place] [address],  [place], [place] or [phone]   Substance Abuse Resources Organization         Address  Phone  Notes  Alcohol and Drug Services  [place]  [phone]   [facility]  [phone]   [place]inita Pester  [phone]   Residential & Outpatient Substance Abuse Program  [phone]   Psychological Services Organization         Address  Phone  Notes  [facility] [place]  [place]  [phone]   [place] [address], [place] or [phone]    Mobile Crisis Teams Organization         Address  Phone  Notes  [facility], Mobile Crisis Care Unit  [phone]   [facility]ive Psychotherapeutic Services  [address] [place], [place]   [place] [address] [place] [phone]    Self-Help/Support Groups Organization         Address  Phone             Notes  [place]. of [place] - variety of support groups  [place] Call for more information  Narcotics Anonymous (NA), [facility] [address], [employer] [place]  2 meetings at this location   Special educational needs teacher         Address  Phone  Notes  [facility] Residential Treatment [place],    [place]  [phone]   [facility]  [address], [place] [address], [place], [place]   Espy Bement, [place] [phone] Admissions: 8am-3pm M-F  Incentives Substance [place] [address]    [place], [place] [phone]   [facility] [address] [place], [place], [place]   [facility] [address]   [place], [place]   [facility] Programs - Intensive Outpatient [address], [place], [place]   [facility] ([place].,  [place], [place] [phone] or [phone]   [facility] Services (RTS) [address] [place], [place] Accepts Medicaid  [place] [address]  [employer] [place] [phone] Substance Abuse/Addiction Treatment   [facility] Organization         Address  Phone  Notes  [facility]  [phone]   Domenic Schwab, PhD [address] Arlis [place], [place]   [phone] or [phone]   [place], [place] [phone]   [facility] [address], [place], [place] [phone] Insurance/Medicaid/sponsorship through [facility] and Families [address], [address]                                    [place], [place] [phone] [place] [address], [place] [phone]    Dr. Adele Schilder  [phone]   [facility] Dept. 1) [address], [place] 2) [place] 3)  [place] [address], [place] [phone]  [phone]   [place] [phone] or [phone] (After Hours)

## 2014-07-13 NOTE — ED Notes (Signed)
Patient transported to X-ray and returned to room without distress noted. 

## 2015-01-11 ENCOUNTER — Emergency Department (HOSPITAL_BASED_OUTPATIENT_CLINIC_OR_DEPARTMENT_OTHER)
Admission: EM | Admit: 2015-01-11 | Discharge: 2015-01-11 | Disposition: A | Discharge status: Home / Self Care | Payer: Self-pay | Attending: Emergency Medicine | Admitting: Emergency Medicine

## 2015-01-11 ENCOUNTER — Encounter (HOSPITAL_BASED_OUTPATIENT_CLINIC_OR_DEPARTMENT_OTHER): Payer: Self-pay | Admitting: *Deleted

## 2015-01-11 ENCOUNTER — Emergency Department (HOSPITAL_BASED_OUTPATIENT_CLINIC_OR_DEPARTMENT_OTHER): Payer: Self-pay

## 2015-01-11 DIAGNOSIS — S9002XA Contusion of left ankle, initial encounter: Secondary | ICD-10-CM | POA: Insufficient documentation

## 2015-01-11 DIAGNOSIS — Z880 Allergy status to penicillin: Secondary | ICD-10-CM | POA: Insufficient documentation

## 2015-01-11 DIAGNOSIS — Y9289 Other specified places as the place of occurrence of the external cause: Secondary | ICD-10-CM | POA: Insufficient documentation

## 2015-01-11 DIAGNOSIS — I10 Essential (primary) hypertension: Secondary | ICD-10-CM | POA: Insufficient documentation

## 2015-01-11 DIAGNOSIS — X58XXXA Exposure to other specified factors, initial encounter: Secondary | ICD-10-CM | POA: Insufficient documentation

## 2015-01-11 DIAGNOSIS — Z79899 Other long term (current) drug therapy: Secondary | ICD-10-CM | POA: Insufficient documentation

## 2015-01-11 DIAGNOSIS — Z720 Tobacco use: Secondary | ICD-10-CM | POA: Insufficient documentation

## 2015-01-11 DIAGNOSIS — Y9389 Activity, other specified: Secondary | ICD-10-CM | POA: Insufficient documentation

## 2015-01-11 DIAGNOSIS — Y998 Other external cause status: Secondary | ICD-10-CM | POA: Insufficient documentation

## 2015-01-11 DIAGNOSIS — S93402A Sprain of unspecified ligament of left ankle, initial encounter: Secondary | ICD-10-CM | POA: Insufficient documentation

## 2015-01-11 DIAGNOSIS — Z7982 Long term (current) use of aspirin: Secondary | ICD-10-CM | POA: Insufficient documentation

## 2015-01-11 MED ORDER — HYDROCODONE-ACETAMINOPHEN 5-325 MG PO TABS: 1.0000 | ORAL_TABLET | ORAL | Status: DC | PRN

## 2015-01-11 MED ORDER — IBUPROFEN 800 MG PO TABS: 800.0000 mg | ORAL_TABLET | Freq: Three times a day (TID) | ORAL | Status: DC

## 2015-01-11 MED ORDER — OXYCODONE-ACETAMINOPHEN 5-325 MG PO TABS
2.0000 | ORAL_TABLET | Freq: Once | ORAL | Status: AC
  Administered 2015-01-11: 2 via ORAL
  Filled 2015-01-11: qty 2

## 2015-01-11 NOTE — ED Provider Notes (Signed)
CSN: [id number]     Arrival date & time 01/11/15  1341 History   First MD Initiated Contact with Patient 01/11/15 1435     Chief Complaint  Patient presents with  . Ankle Injury     (Consider location/radiation/quality/duration/timing/severity/associated sxs/prior Treatment) HPI Comments: 44 year old male complaining of left ankle pain after twisting it and landing on it wrong doing a box jump yesterday. When he woke up this morning, the pain increased and he was unable to bear weight. Pain 10/10, radiates up the side of his leg, worse with any pressure. States it is starting to appear bruised. No alleviating factors. No numbness or tingling.  The history is provided by the patient.    Past Medical History  Diagnosis Date  . Hypertension    Past Surgical History  Procedure Laterality Date  . Arm surgery      torn bicept   Family History  Problem Relation Age of Onset  . Hypertension Mother   . Hypertension Father   . Cancer Other    Social History  Substance Use Topics  . Smoking status: Current Some Day Smoker -- 0.50 packs/day    Types: Cigarettes  . Smokeless tobacco: None  . Alcohol Use: Yes     Comment: occ    Review of Systems  Constitutional: Negative for fever.  Musculoskeletal:       + L ankle pain and swelling.  Skin: Positive for color change.  Neurological: Negative for numbness.      Allergies  Penicillins and Sulfa antibiotics  Home Medications   Prior to Admission medications   Medication Sig Start Date End Date Taking? Authorizing Provider  aspirin 81 MG chewable tablet Chew 1 tablet (81 mg total) by mouth daily. 07/13/14   Nicole Pisciotta, PA-C  aspirin-acetaminophen-caffeine (EXCEDRIN MIGRAINE) 5315209917 MG per tablet Take 2 tablets by mouth every 6 (six) hours as needed for headache.    Historical Provider, MD  Guaifenesin 1200 MG TB12 Take 1 tablet (1,200 mg total) by mouth 2 (two) times daily. Patient not taking: Reported on 06/15/2014  01/26/14   Dalia Heading, PA-C  hydrochlorothiazide (HYDRODIURIL) 12.5 MG tablet Take 1 tablet (12.5 mg total) by mouth daily. 07/13/14   Nicole Pisciotta, PA-C  HYDROcodone-acetaminophen (NORCO/VICODIN) 5-325 MG per tablet Take 1-2 tablets by mouth every 4 (four) hours as needed. 01/11/15   Seamus Warehime M Jarret Torre, PA-C  ibuprofen (ADVIL,MOTRIN) 800 MG tablet Take 1 tablet (800 mg total) by mouth 3 (three) times daily. 01/11/15   Jonavon Trieu M Alishah Schulte, PA-C  lisinopril-hydrochlorothiazide (PRINZIDE,ZESTORETIC) 10-12.5 MG per tablet Take 1 tablet by mouth daily. 06/15/14   [name], MD  LORazepam (ATIVAN) 1 MG tablet Take 1 tablet (1 mg total) by mouth 3 (three) times daily as needed for anxiety. 06/15/14   [name], MD  methocarbamol (ROBAXIN) 500 MG tablet Take 1 tablet (500 mg total) by mouth 2 (two) times daily. Patient not taking: Reported on 06/15/2014 02/24/14   Domenic Moras, PA-C  naproxen (NAPROSYN) 500 MG tablet Take 1 tablet (500 mg total) by mouth 2 (two) times daily. Patient not taking: Reported on 06/15/2014 02/24/14   Domenic Moras, PA-C  predniSONE (DELTASONE) 50 MG tablet Take 1 tablet (50 mg total) by mouth daily. Patient not taking: Reported on 06/15/2014 01/26/14   Dalia Heading, PA-C  promethazine-dextromethorphan (PROMETHAZINE-DM) 6.25-15 MG/5ML syrup Take 5 mLs by mouth 4 (four) times daily as needed for cough. Patient not taking: Reported on 06/15/2014 01/26/14   Dalia Heading, PA-C  traMADol-acetaminophen (  ULTRACET) 37.5-325 MG per tablet Take 1 tablet by mouth every 6 (six) hours as needed. Patient not taking: Reported on 06/15/2014 01/14/14   Larene Pickett, PA-C   BP 152/95 mmHg  Pulse 84  Temp(Src) 98.2 F (36.8 C) (Oral)  Resp 20  Ht 5\' 9"  (1.753 m)  Wt 190 lb (86.183 kg)  BMI 28.05 kg/m2  SpO2 95% Physical Exam  Constitutional: He is oriented to person, place, and time. He appears well-developed and well-nourished. No distress.  HENT:  Head: Normocephalic and  atraumatic.  Eyes: Conjunctivae and EOM are normal.  Neck: Normal range of motion. Neck supple.  Cardiovascular: Normal rate, regular rhythm and normal heart sounds.   Pulmonary/Chest: Effort normal and breath sounds normal.  Musculoskeletal:       Left ankle: He exhibits decreased range of motion (due to pain), swelling (anterior and lateral) and ecchymosis (lateral). He exhibits no deformity, no laceration and normal pulse. Tenderness. Lateral malleolus, AITFL and CF ligament tenderness found. No proximal fibula tenderness found. Achilles tendon normal.  Neurological: He is alert and oriented to person, place, and time.  Skin: Skin is warm and dry.  Psychiatric: He has a normal mood and affect. His behavior is normal.  Nursing note and vitals reviewed.   ED Course  Procedures (including critical care time) Labs Review Labs Reviewed - No data to display  Imaging Review Dg Ankle Complete Left  01/11/2015   CLINICAL DATA:  Pain after trauma yesterday. Pain primarily medially. Initial encounter.  EXAM: LEFT ANKLE COMPLETE - 3+ VIEW  COMPARISON:  None.  FINDINGS: Moderate lateral malleolar soft tissue swelling. No acute fracture or dislocation. Soft tissue swelling extends anterior to the ankle on the lateral view. Base of fifth metatarsal and talar dome intact.  IMPRESSION: Soft tissue swelling, without acute osseous finding.   Electronically Signed   By: Abigail Miyamoto M.D.   On: 01/11/2015 14:11   I have personally reviewed and evaluated these images and lab results as part of my medical decision-making.   EKG Interpretation None      MDM   Final diagnoses:  Left ankle sprain, initial encounter   Neurovascularly intact. X-ray negative for acute fracture. Significant swelling and tenderness noted. Wil apply Secondary school teacher. Advised rice and NSAIDs. Follow-up with orthopedics. Stable for discharge. Return precautions given. Patient states understanding of treatment care plan and  is agreeable.   Carman Ching, PA-C 01/11/15 1535  Sherwood Gambler, MD 01/13/15 [phone]

## 2015-01-11 NOTE — ED Notes (Signed)
Was doing exercises last night and landed wrong on his left ankle. Pain in is right groin.

## 2015-01-11 NOTE — Discharge Instructions (Signed)
Take Vicodin for severe pain only. No driving or operating heavy machinery while taking vicodin. This medication may cause drowsiness. Take ibuprofen as prescribed. Follow-up with Dr. Barbaraann Barthel.  Ankle Sprain An ankle sprain is an injury to the strong, fibrous tissues (ligaments) that hold the bones of your ankle joint together.  CAUSES An ankle sprain is usually caused by a fall or by twisting your ankle. Ankle sprains most commonly occur when you step on the outer edge of your foot, and your ankle turns inward. People who participate in sports are more prone to these types of injuries.  SYMPTOMS   Pain in your ankle. The pain may be present at rest or only when you are trying to stand or walk.  Swelling.  Bruising. Bruising may develop immediately or within 1 to 2 days after your injury.  Difficulty standing or walking, particularly when turning corners or changing directions. DIAGNOSIS  Your caregiver will ask you details about your injury and perform a physical exam of your ankle to determine if you have an ankle sprain. During the physical exam, your caregiver will press on and apply pressure to specific areas of your foot and ankle. Your caregiver will try to move your ankle in certain ways. An X-ray exam may be done to be sure a bone was not broken or a ligament did not separate from one of the bones in your ankle (avulsion fracture).  TREATMENT  Certain types of braces can help stabilize your ankle. Your caregiver can make a recommendation for this. Your caregiver may recommend the use of medicine for pain. If your sprain is severe, your caregiver may refer you to a surgeon who helps to restore function to parts of your skeletal system (orthopedist) or a physical therapist. [place] ice to your injury for 1-2 days or as directed by your caregiver. Applying ice helps to reduce inflammation and pain.  Put ice in a plastic bag.  Place a towel between your skin and  the bag.  Leave the ice on for 15-20 minutes at a time, every 2 hours while you are awake.  Only take over-the-counter or prescription medicines for pain, discomfort, or fever as directed by your caregiver.  Elevate your injured ankle above the level of your heart as much as possible for 2-3 days.  If your caregiver recommends crutches, use them as instructed. Gradually put weight on the affected ankle. Continue to use crutches or a cane until you can walk without feeling pain in your ankle.  If you have a plaster splint, wear the splint as directed by your caregiver. Do not rest it on anything harder than a pillow for the first 24 hours. Do not put weight on it. Do not get it wet. You may take it off to take a shower or bath.  You may have been given an elastic bandage to wear around your ankle to provide support. If the elastic bandage is too tight (you have numbness or tingling in your foot or your foot becomes cold and blue), adjust the bandage to make it comfortable.  If you have an air splint, you may blow more air into it or let air out to make it more comfortable. You may take your splint off at night and before taking a shower or bath. Wiggle your toes in the splint several times per day to decrease swelling. SEEK MEDICAL CARE IF:   You have rapidly increasing bruising or swelling.  Your toes feel  extremely cold or you lose feeling in your foot.  Your pain is not relieved with medicine. SEEK IMMEDIATE MEDICAL CARE IF:  Your toes are numb or blue.  You have severe pain that is increasing. MAKE SURE YOU:   Understand these instructions.  Will watch your condition.  Will get help right away if you are not doing well or get worse. Document Released: 05/14/2005 Document Revised: 02/06/2012 Document Reviewed: 05/26/2011 [facility] Patient Information 2015 [place], [place]. This information is not intended to replace advice given to you by your health care provider. Make sure you  discuss any questions you have with your health care provider. RICE: Routine Care for Injuries The routine care of many injuries includes Rest, Ice, Compression, and Elevation (RICE). HOME CARE INSTRUCTIONS  Rest is needed to allow your body to heal. Routine activities can usually be resumed when comfortable. Injured tendons and bones can take up to 6 weeks to heal. Tendons are the cord-like structures that attach muscle to bone.  Ice following an injury helps keep the swelling down and reduces pain.  Put ice in a plastic bag.  Place a towel between your skin and the bag.  Leave the ice on for 15-20 minutes, 3-4 times a day, or as directed by your health care provider. Do this while awake, for the first 24 to 48 hours. After that, continue as directed by your caregiver.  Compression helps keep swelling down. It also gives support and helps with discomfort. If an elastic bandage has been applied, it should be removed and reapplied every 3 to 4 hours. It should not be applied tightly, but firmly enough to keep swelling down. Watch fingers or toes for swelling, bluish discoloration, coldness, numbness, or excessive pain. If any of these problems occur, remove the bandage and reapply loosely. Contact your caregiver if these problems continue.  Elevation helps reduce swelling and decreases pain. With extremities, such as the arms, hands, legs, and feet, the injured area should be placed near or above the level of the heart, if possible. SEEK IMMEDIATE MEDICAL CARE IF:  You have persistent pain and swelling.  You develop redness, numbness, or unexpected weakness.  Your symptoms are getting worse rather than improving after several days. These symptoms may indicate that further evaluation or further X-rays are needed. Sometimes, X-rays may not show a small broken bone (fracture) until 1 week or 10 days later. Make a follow-up appointment with your caregiver. Ask when your X-ray results will be  ready. Make sure you get your X-ray results. Document Released: 08/26/2000 Document Revised: 05/19/2013 Document Reviewed: 10/13/2010 [facility] Patient Information 2015 [place], [place]. This information is not intended to replace advice given to you by your health care provider. Make sure you discuss any questions you have with your health care provider.

## 2015-01-11 NOTE — ED Notes (Signed)
Pt. Has positive pedal pulse in the L foot.

## 2015-01-14 ENCOUNTER — Ambulatory Visit: Payer: Self-pay | Admitting: Family Medicine

## 2015-01-17 ENCOUNTER — Encounter: Payer: Self-pay | Admitting: Family Medicine

## 2015-01-17 ENCOUNTER — Ambulatory Visit (INDEPENDENT_AMBULATORY_CARE_PROVIDER_SITE_OTHER): Payer: Self-pay | Admitting: Family Medicine

## 2015-01-17 VITALS — BP 154/109 | HR 72 | Ht 69.0 in | Wt 190.0 lb

## 2015-01-17 DIAGNOSIS — S8992XA Unspecified injury of left lower leg, initial encounter: Secondary | ICD-10-CM

## 2015-01-17 MED ORDER — OXYCODONE-ACETAMINOPHEN 5-325 MG PO TABS: 1.0000 | ORAL_TABLET | Freq: Four times a day (QID) | ORAL | Status: DC | PRN

## 2015-01-17 NOTE — Patient Instructions (Signed)
I'm concerned you have a maisonneuve fracture. Move forward with the [facility] coverage and call me ASAP after this. MRI is the next step to fully evaluate injuries to the ankle. Percocet as needed for severe pain. Do not put any weight on this leg.

## 2015-01-18 ENCOUNTER — Telehealth: Payer: Self-pay | Admitting: Family Medicine

## 2015-01-19 ENCOUNTER — Ambulatory Visit: Payer: Self-pay

## 2015-01-19 ENCOUNTER — Ambulatory Visit: Payer: Self-pay | Admitting: Family Medicine

## 2015-01-19 ENCOUNTER — Encounter (HOSPITAL_COMMUNITY): Payer: Self-pay | Admitting: *Deleted

## 2015-01-19 ENCOUNTER — Emergency Department (HOSPITAL_COMMUNITY)
Admission: EM | Admit: 2015-01-19 | Discharge: 2015-01-19 | Disposition: A | Discharge status: Home / Self Care | Payer: Self-pay | Attending: Emergency Medicine | Admitting: Emergency Medicine

## 2015-01-19 ENCOUNTER — Emergency Department (HOSPITAL_BASED_OUTPATIENT_CLINIC_OR_DEPARTMENT_OTHER)
Admit: 2015-01-19 | Discharge: 2015-01-19 | Disposition: A | Discharge status: Home / Self Care | Payer: Self-pay | Attending: Emergency Medicine | Admitting: Emergency Medicine

## 2015-01-19 DIAGNOSIS — S99922A Unspecified injury of left foot, initial encounter: Secondary | ICD-10-CM | POA: Insufficient documentation

## 2015-01-19 DIAGNOSIS — Y929 Unspecified place or not applicable: Secondary | ICD-10-CM | POA: Insufficient documentation

## 2015-01-19 DIAGNOSIS — S8990XA Unspecified injury of unspecified lower leg, initial encounter: Secondary | ICD-10-CM | POA: Insufficient documentation

## 2015-01-19 DIAGNOSIS — X58XXXA Exposure to other specified factors, initial encounter: Secondary | ICD-10-CM | POA: Insufficient documentation

## 2015-01-19 DIAGNOSIS — Z720 Tobacco use: Secondary | ICD-10-CM | POA: Insufficient documentation

## 2015-01-19 DIAGNOSIS — Z880 Allergy status to penicillin: Secondary | ICD-10-CM | POA: Insufficient documentation

## 2015-01-19 DIAGNOSIS — I10 Essential (primary) hypertension: Secondary | ICD-10-CM | POA: Insufficient documentation

## 2015-01-19 DIAGNOSIS — Y9389 Activity, other specified: Secondary | ICD-10-CM | POA: Insufficient documentation

## 2015-01-19 DIAGNOSIS — M79609 Pain in unspecified limb: Secondary | ICD-10-CM

## 2015-01-19 DIAGNOSIS — Z79899 Other long term (current) drug therapy: Secondary | ICD-10-CM | POA: Insufficient documentation

## 2015-01-19 DIAGNOSIS — Z7982 Long term (current) use of aspirin: Secondary | ICD-10-CM | POA: Insufficient documentation

## 2015-01-19 DIAGNOSIS — S9002XA Contusion of left ankle, initial encounter: Secondary | ICD-10-CM | POA: Insufficient documentation

## 2015-01-19 DIAGNOSIS — Y998 Other external cause status: Secondary | ICD-10-CM | POA: Insufficient documentation

## 2015-01-19 NOTE — ED Provider Notes (Signed)
CSN: [id number]     Arrival date & time 01/19/15  1007 History  This chart was scribed for non-physician practitioner, Linus Mako, working with Blanchie Dessert, MD by Evelene Croon, ED Scribe. This patient was seen in room TR09C/TR09C and the patient's care was started at 11:17 AM.     Chief Complaint  Patient presents with  . Leg Pain   The history is provided by the patient. No language interpreter was used.   HPI Comments:  Ronald [name] is a 44 y.o. male who presents to the Emergency Department complaining of increased pain to his LLE today.Pt saw PCP on 01/17/15 after injury of the extremity and had cast to LLE placed and informed that he needed surgery on the extremity.  Pt reports concern for DVT due to swelling of the extremity; reports family history of DVT and PE and his mom is afraid for him. Swelling and pain is not worse than it has been. Pt came to the hospital today to get orange card and decided to check in due to pain and "red tape" he encountered while trying to get the orange card. Pt is currently on percocet for pain. Pt denies CP, and SOB. Pt was not hypoxic in triage  He saw [name] on Monday when the splint was placed. No problems of compartment syndrome noted.  Past Medical History  Diagnosis Date  . Hypertension    Past Surgical History  Procedure Laterality Date  . Arm surgery      torn bicept   Family History  Problem Relation Age of Onset  . Hypertension Mother   . Hypertension Father   . Cancer Other    Social History  Substance Use Topics  . Smoking status: Current Some Day Smoker -- 0.50 packs/day    Types: Cigarettes  . Smokeless tobacco: None  . Alcohol Use: 0.0 oz/week    0 Standard drinks or equivalent per week     Comment: occ    Review of Systems  Respiratory: Negative for shortness of breath.   Cardiovascular: Negative for chest pain.  Musculoskeletal: Positive for myalgias (LLE) and joint swelling.  All other systems reviewed  and are negative.     Allergies  Penicillins and Sulfa antibiotics  Home Medications   Prior to Admission medications   Medication Sig Start Date End Date Taking? Authorizing Provider  aspirin 81 MG chewable tablet Chew 1 tablet (81 mg total) by mouth daily. 07/13/14   Nicole Pisciotta, PA-C  hydrochlorothiazide (HYDRODIURIL) 12.5 MG tablet Take 1 tablet (12.5 mg total) by mouth daily. 07/13/14   Nicole Pisciotta, PA-C  lisinopril-hydrochlorothiazide (PRINZIDE,ZESTORETIC) 10-12.5 MG per tablet Take 1 tablet by mouth daily. 06/15/14   [name], MD  oxyCODONE-acetaminophen (PERCOCET/ROXICET) 5-325 MG per tablet Take 1 tablet by mouth every 6 (six) hours as needed for severe pain. 01/17/15   Dene Gentry, MD   BP 152/93 mmHg  Pulse 60  Temp(Src) 97.3 F (36.3 C) (Oral)  Resp 16  Ht 5\' 9"  (1.753 m)  Wt 190 lb (86.183 kg)  BMI 28.05 kg/m2  SpO2 99% Physical Exam  Constitutional: He is oriented to person, place, and time. He appears well-developed and well-nourished. No distress.  HENT:  Head: Normocephalic and atraumatic.  Eyes: Conjunctivae are normal.  Neck: Normal range of motion.  Cardiovascular: Normal rate.   Pulmonary/Chest: Effort normal.  Musculoskeletal:       Left ankle: He exhibits decreased range of motion, swelling and ecchymosis. He exhibits no  deformity, no laceration and normal pulse. Tenderness (diffuse).  Neurological: He is alert and oriented to person, place, and time.  Skin: Skin is warm and dry.  Psychiatric: He has a normal mood and affect. His behavior is normal.  Nursing note and vitals reviewed.   ED Course  Procedures  DIAGNOSTIC STUDIES:  Oxygen Saturation is 100% on RA, normal by my interpretation.    COORDINATION OF CARE:  11:28 AlM Will order an US of the LLE to r/o DVT.  Discussed treatment plan with pt at bedside and pt agreed to plan.  Author: Elmo Putt Service: Vascular Lab Author Type: Cardiovascular Sonographer     Filed: 01/19/2015 12:33 PM Note Time: 01/19/2015 12:32 PM Status: Signed   Editor: Doyne Keel Simonetti (Cardiovascular Sonographer)     Expand All Collapse All   *Preliminary Results* Left lower extremity venous duplex completed. Visualized veins of the left lower extremity are negative for deep vein thrombosis. There is no evidence of left Baker's cyst.  Preliminary results discussed with Delos Haring, PA-C.  01/19/2015 12:32 PM  Maudry Mayhew, RVT, RDCS, RDMS         Labs Review Labs Reviewed - No data to display  Imaging Review No results found. I have personally reviewed and evaluated these images and lab results as part of my medical decision-making.   EKG Interpretation None      MDM   Final diagnoses:  Foot injury, left, initial encounter    No DVT Case Manager consulted to help patient with getting and [employer].   Pt to follow-up with [name].--  And at todays visit he does not have symptoms of compartment syndrome. He has no other concerns.   Medications - No data to display  44 y.o.Ronald [name]'s evaluation in the Emergency Department is complete. It has been determined that no acute conditions requiring further emergency intervention are present at this time. The patient/guardian have been advised of the diagnosis and plan. We have discussed signs and symptoms that warrant return to the ED, such as changes or worsening in symptoms.  Vital signs are stable at discharge. Filed Vitals:   01/19/15 1453  BP: 152/93  Pulse: 60  Temp: 97.3 F (36.3 C)  Resp: 16    Patient/guardian has voiced understanding and agreed to follow-up with the PCP or specialist.   I personally performed the services described in this documentation, which was scribed in my presence. The recorded information has been reviewed and is accurate.   Delos Haring, PA-C 01/19/15 1514  Blanchie Dessert, MD 01/19/15 2231

## 2015-01-19 NOTE — Progress Notes (Signed)
Orthopedic Tech Progress Note Patient Details:  Ronald [name] November 19, 1970 [id number]  Ortho Devices Type of Ortho Device: Ace wrap, Post (short leg) splint, Stirrup splint Ortho Device/Splint Location: lle Ortho Device/Splint Interventions: Application   [name] 01/19/2015, 1:35 PM

## 2015-01-19 NOTE — ED Notes (Addendum)
Patient requesting something to drink so he can take pain meds he brought from home  [place] notified

## 2015-01-19 NOTE — ED Notes (Addendum)
Patient returned from vas [place]

## 2015-01-19 NOTE — Progress Notes (Signed)
*  Preliminary Results* Left lower extremity venous duplex completed. Visualized veins of the left lower extremity are negative for deep vein thrombosis. There is no evidence of left Baker's cyst.  Preliminary results discussed with Delos Haring, PA-C.  01/19/2015 12:32 PM  Maudry Mayhew, RVT, RDCS, RDMS

## 2015-01-19 NOTE — ED Notes (Signed)
Change of acuity for R/O of DVT

## 2015-01-19 NOTE — Telephone Encounter (Signed)
Spoke to patient asked if he had nausea, stated he did not but felt ill from the pain. Stated he was taking his pain medication. He has an appointment with Bonna Gains this morning and had one with our office, told him we could switch it to this afternoon to our office to see the physician for reevaluation.  Patient stated he will have to check on transportation and will try to come in this afternoon.  If unable to make it he may have to be seen in emergency department.

## 2015-01-19 NOTE — Assessment & Plan Note (Signed)
concerning for maisonneuve fracture with severe ankle injury, inability to bear weight, concurrent proximal fibular fracture.  Unfortunately he does not have insurance coverage - stressed importance of getting this then would do MRI to assess for disruption of deltoid ligament, syndesmosis.  Will likely need surgical intervention for this.  Discussed options in meantime and given both posterior and stirrup splints.  No weight bearing.  Try oxycodone instead for pain.  No evidence compartment syndrome.

## 2015-01-19 NOTE — ED Notes (Signed)
PT resents today with on going pain to LT lower leg. Pt has splint to LT lower leg on arrival to [place] room 9.

## 2015-01-19 NOTE — Progress Notes (Signed)
PCP: No primary care provider on file.  Subjective:   HPI: Patient is a 44 y.o. male here for left ankle injury.  Patient reports on 8/15 he was doing 2 foot box jumps - came down from one, landed on ankle wrong (not sure cwhich way) and almost passed out due to significant pain. Unable to bear weight. Pain level staying at 10/10.  Swelling, bruising through heel, foot. Tried cam walker but could not tolerate this - felt it was leading to more bruising in his heel. Is very active in boxing, MMA. No prior injuries. Unable to tolerate much pain medicine - tried norco but it made him nauseous.  Past Medical History  Diagnosis Date  . Hypertension     Current Outpatient Prescriptions on File Prior to Visit  Medication Sig Dispense Refill  . aspirin 81 MG chewable tablet Chew 1 tablet (81 mg total) by mouth daily. 30 tablet 0  . hydrochlorothiazide (HYDRODIURIL) 12.5 MG tablet Take 1 tablet (12.5 mg total) by mouth daily. 30 tablet 1  . lisinopril-hydrochlorothiazide (PRINZIDE,ZESTORETIC) 10-12.5 MG per tablet Take 1 tablet by mouth daily. 30 tablet 0   No current facility-administered medications on file prior to visit.    Past Surgical History  Procedure Laterality Date  . Arm surgery      torn bicept    Allergies  Allergen Reactions  . Penicillins Anaphylaxis  . Sulfa Antibiotics Anaphylaxis    Social History   Social History  . Marital Status: Married    Spouse Name: N/A  . Number of Children: N/A  . Years of Education: N/A   Occupational History  . Not on file.   Social History Main Topics  . Smoking status: Current Some Day Smoker -- 0.50 packs/day    Types: Cigarettes  . Smokeless tobacco: Not on file  . Alcohol Use: 0.0 oz/week    0 Standard drinks or equivalent per week     Comment: occ  . Drug Use: No  . Sexual Activity: Not on file   Other Topics Concern  . Not on file   Social History Narrative    Family History  Problem Relation Age of  Onset  . Hypertension Mother   . Hypertension Father   . Cancer Other     BP 154/109 mmHg  Pulse 72  Ht 5\' 9"  (1.753 m)  Wt 190 lb (86.183 kg)  BMI 28.05 kg/m2  Review of Systems: See HPI above.    Objective:  Physical Exam:  Gen: NAD  Left lower leg: Moderate swelling, bruising throughout ankle.  No other lower leg swelling, bruising.  No rigidity or tenderness of calf. TTP anterior ankle, over deltoid and less over ATFL.  Mod tenderness fibular head. Minimal ROM all directions. Did not test strength. Sensation intact to light touch distally. Negative tinels at fibular head over common peroneal nerve. 2+ dp pulse.  MSK u/s:  Confirms left proximal fibular fracture - image saved, printed copy for patient.    Assessment & Plan:  1. Lower leg injury - concerning for maisonneuve fracture with severe ankle injury, inability to bear weight, concurrent proximal fibular fracture.  Unfortunately he does not have insurance coverage - stressed importance of getting this then would do MRI to assess for disruption of deltoid ligament, syndesmosis.  Will likely need surgical intervention for this.  Discussed options in meantime and given both posterior and stirrup splints.  No weight bearing.  Try oxycodone instead for pain.  No evidence compartment syndrome.

## 2015-01-20 ENCOUNTER — Telehealth: Payer: Self-pay | Admitting: *Deleted

## 2015-01-20 ENCOUNTER — Emergency Department (HOSPITAL_COMMUNITY)
Admission: EM | Admit: 2015-01-20 | Discharge: 2015-01-20 | Disposition: A | Discharge status: Home / Self Care | Payer: Self-pay | Attending: Emergency Medicine | Admitting: Emergency Medicine

## 2015-01-20 ENCOUNTER — Encounter (HOSPITAL_COMMUNITY): Payer: Self-pay | Admitting: Neurology

## 2015-01-20 DIAGNOSIS — Z720 Tobacco use: Secondary | ICD-10-CM | POA: Insufficient documentation

## 2015-01-20 DIAGNOSIS — S8992XD Unspecified injury of left lower leg, subsequent encounter: Secondary | ICD-10-CM

## 2015-01-20 DIAGNOSIS — X58XXXD Exposure to other specified factors, subsequent encounter: Secondary | ICD-10-CM | POA: Insufficient documentation

## 2015-01-20 DIAGNOSIS — I10 Essential (primary) hypertension: Secondary | ICD-10-CM | POA: Insufficient documentation

## 2015-01-20 DIAGNOSIS — Z79899 Other long term (current) drug therapy: Secondary | ICD-10-CM | POA: Insufficient documentation

## 2015-01-20 DIAGNOSIS — Z880 Allergy status to penicillin: Secondary | ICD-10-CM | POA: Insufficient documentation

## 2015-01-20 MED ORDER — OXYCODONE-ACETAMINOPHEN 5-325 MG PO TABS
1.0000 | ORAL_TABLET | Freq: Once | ORAL | Status: AC
  Administered 2015-01-20: 1 via ORAL
  Filled 2015-01-20: qty 1

## 2015-01-20 MED ORDER — OXYCODONE-ACETAMINOPHEN 5-325 MG PO TABS: 1.0000 | ORAL_TABLET | ORAL | Status: DC | PRN

## 2015-01-20 NOTE — ED Notes (Signed)
Pt reports has fractured fibula. "I'm broke up and all they gave me was percocet". Wants to see if he needs another xray and make sure bandage is applied correctly to his left leg/foot. Pt is here using crutches. They  Want to do surgery to place a screw but he just got an orange card.

## 2015-01-20 NOTE — Care Management (Signed)
ED CM consulted by Mariea Clonts PA-C in [facility] concerning patient needing follow-up with PCP for a referral to Ortho. Patient was seen in the ED yesterday, patient has left leg soft tissue swelling.  Patient currently does not have health insurance. Patient states, he is in the process of OC and [facility]. Patient is requesting an Ortho referral for repair of leg fx.  ED CM explained that patient will need to g/et a referral from PCP. Patient verbalized understanding. CM scheduled appt for [facility] financial Counselor for 8/26 at 12n. Explained to patient he can may be seen by the Walk In Provider at the clinic for the referral. Patient is agreeable with plan. Updated Mariea Clonts PA-C on discharge plan he is amendable. No further CM needs identified

## 2015-01-20 NOTE — ED Provider Notes (Signed)
CSN: [id number]     Arrival date & time 01/20/15  1736 History  This chart was scribed for non-physician practitioner, Ronald Pean, PA-C working with Ronald Leigh, MD by Ronald [name], ED scribe. This patient was seen in room TR02C/TR02C and the patient's care was started at 5:59 PM    Chief Complaint  Patient presents with  . Leg Pain   The history is provided by the patient. No language interpreter was used.    HPI Comments: Ronald [name] is a 44 y.o. male who presents to the Emergency Department complaining of moderate left lower extremity pain onset 7 days ago and continues today. He states associated left foot numbness onset 7 days ago and left calf pain onset yesterday. He states moderate relief with Percocet. Pt was seen in the ED on 01/19/15 for the same pain and on 01/11/15 for a left ankle injury. Pt notes that he received a follow up with Dr. Felipe [name] and has not received the orange card. He also spoke with case management yesterday. Pt notes that he removed his splint last night. He denies any new pains or symptoms since being seen yesterday. He is requesting his orange card and more pain medication as he is almost out.   Past Medical History  Diagnosis Date  . Hypertension    Past Surgical History  Procedure Laterality Date  . Arm surgery      torn bicept   Family History  Problem Relation Age of Onset  . Hypertension Mother   . Hypertension Father   . Cancer Other    Social History  Substance Use Topics  . Smoking status: Current Some Day Smoker -- 0.50 packs/day    Types: Cigarettes  . Smokeless tobacco: None  . Alcohol Use: 0.0 oz/week    0 Standard drinks or equivalent per week     Comment: occ    Review of Systems  Constitutional: Negative for fever.  Musculoskeletal: Positive for arthralgias. Negative for back pain.  Skin: Negative for rash and wound.  Neurological: Positive for numbness. Negative for weakness.    Allergies  Penicillins and Sulfa  antibiotics  Home Medications   Prior to Admission medications   Medication Sig Start Date End Date Taking? Authorizing Provider  aspirin 81 MG chewable tablet Chew 1 tablet (81 mg total) by mouth daily. 07/13/14   Nicole Pisciotta, PA-C  hydrochlorothiazide (HYDRODIURIL) 12.5 MG tablet Take 1 tablet (12.5 mg total) by mouth daily. 07/13/14   Nicole Pisciotta, PA-C  lisinopril-hydrochlorothiazide (PRINZIDE,ZESTORETIC) 10-12.5 MG per tablet Take 1 tablet by mouth daily. 06/15/14   Ronald Belling, MD  oxyCODONE-acetaminophen (PERCOCET/ROXICET) 5-325 MG per tablet Take 1 tablet by mouth every 4 (four) hours as needed for severe pain. May take 2 tablets PO q 6 hours for severe pain - Do not take with Tylenol as this tablet already contains tylenol 01/20/15   Ronald Pean, PA-C   BP 164/109 mmHg  Pulse 89  Temp(Src) 97.7 F (36.5 C) (Oral)  Resp 22  SpO2 100% Physical Exam  Constitutional: He appears well-developed and well-nourished. No distress.  Nontoxic appearing.  HENT:  Head: Normocephalic and atraumatic.  Eyes: Right eye exhibits no discharge. Left eye exhibits no discharge.  Cardiovascular: Normal rate and intact distal pulses.   Good capillary refill to his left distal toes  Pulmonary/Chest: Effort normal. No respiratory distress.  Musculoskeletal: He exhibits edema and tenderness.  Good cap refill to the left lower extremity. Splint loosely wrapped around his leg. No foot edema.  Left lower leg is diffusely tender to palpation. No erythema or warmth. Compartments are soft.   Neurological: He is alert. No sensory deficit. Coordination normal.  Sensation intact to the left lower extremity.   Skin: Skin is warm and dry. No rash noted. He is not diaphoretic. No erythema. No pallor.  Psychiatric: He has a normal mood and affect. His behavior is normal.  Nursing note and vitals reviewed.   ED Course  Procedures (including critical care time) DIAGNOSTIC STUDIES: Oxygen Saturation  is 95% on RA, adequate by my interpretation.    COORDINATION OF CARE: 6:04 PM Discussed treatment plan with pt at bedside and pt agreed to plan.   Labs Review Labs Reviewed - No data to display  Imaging Review No results found.    EKG Interpretation None      Filed Vitals:   01/20/15 1745 01/20/15 1857  BP: 158/97 164/109  Pulse: 105 89  Temp: 98.7 F (37.1 C) 97.7 F (36.5 C)  TempSrc: Oral Oral  Resp: 18 22  SpO2: 95% 100%     MDM   Meds given in ED:  Medications  oxyCODONE-acetaminophen (PERCOCET/ROXICET) 5-325 MG per tablet 1 tablet (1 tablet Oral Given 01/20/15 1812)    Discharge Medication List as of 01/20/2015  6:49 PM      Final diagnoses:  Injury, lower leg, left, subsequent encounter   This is a 44 year old male who returns to the emergency department today requesting help with continued pain and an orange card after he injured his ankle last week. The patient sustained an ankle injury about a week ago and has a fibular fracture diagnosed by Dr. Ned [name]. He was placed in a posterior splint and advised to be nonweightbearing and has been organizing follow-up with orthopedic surgery. The patient was seen in the emergency department yesterday as he reported the splint was uncomfortable and he had swelling of his left lower leg. The patient had a ultrasound DVT study yesterday which was negative for DVT. Patient reports that he has no new pain or symptoms but that his splint is uncomfortable. He reports he was told that he might need surgery but has not been given information for surgery yet. She reports he's been working to get an orange card. He was requesting an orange card here today. He also reports he has 1 remaining pill of Percocet left. He reports the Percocet is the only thing that is helping him with his pain.  On my exam the patient is afebrile and nontoxic appearing. The patient's splint is loosely wrapped around his leg. We'll replace this today. Patient  has good sensation in his left distal toes with good capillary refill. His left lower leg is diffusely tender to palpation. His compartments are soft. He had a negative DVT study yesterday. [facility] called to replace his splint. I also consulted with case management who got him an appointment with the [facility] for tomorrow as well as put in a referral for [facility] orthopedics to see him in follow-up. I will also provide the patient with a small amount of Percocet until he can follow-up with orthopedic surgery. At reevaluation the patient reports he feels much better now but the splint has been replaced and he feels comfortable in the splint. Provided education on nonweightbearing and splint and cast care. I encouraged him to keep the follow up appointments with of the wellness center and to follow-up with [facility] orthopedics. I advised the patient to follow-up with their primary care provider this  week. I advised the patient to return to the emergency department with new or worsening symptoms or new concerns. The patient verbalized understanding and agreement with plan.    This patient was discussed with Dr. Zenia Resides who agrees with assessment and plan.   I personally performed the services described in this documentation, which was scribed in my presence. The recorded information has been reviewed and is accurate.      Ronald Pean, PA-C 01/20/15 2014  Ronald Leigh, MD 01/20/15 [phone]

## 2015-01-20 NOTE — Telephone Encounter (Signed)
Patient called stating was in a waiting room at a hospital and said that he still does not have [place] card, due to having to provide additional information.  Patient stated he has to pick up a document on Tuesday and would get [facility] card. Told patient to let us know when the cone coverage goes through.

## 2015-01-20 NOTE — Discharge Instructions (Signed)
Cast or Splint Care Casts and splints support injured limbs and keep bones from moving while they heal. It is important to care for your cast or splint at home.  HOME CARE INSTRUCTIONS  Keep the cast or splint uncovered during the drying period. It can take 24 to 48 hours to dry if it is made of plaster. A fiberglass cast will dry in less than 1 hour.  Do not rest the cast on anything harder than a pillow for the first 24 hours.  Do not put weight on your injured limb or apply pressure to the cast until your health care provider gives you permission.  Keep the cast or splint dry. Wet casts or splints can lose their shape and may not support the limb as well. A wet cast that has lost its shape can also create harmful pressure on your skin when it dries. Also, wet skin can become infected.  Cover the cast or splint with a plastic bag when bathing or when out in the rain or snow. If the cast is on the trunk of the body, take sponge baths until the cast is removed.  If your cast does become wet, dry it with a towel or a blow dryer on the cool setting only.  Keep your cast or splint clean. Soiled casts may be wiped with a moistened cloth.  Do not place any hard or soft foreign objects under your cast or splint, such as cotton, toilet paper, lotion, or powder.  Do not try to scratch the skin under the cast with any object. The object could get stuck inside the cast. Also, scratching could lead to an infection. If itching is a problem, use a blow dryer on a cool setting to relieve discomfort.  Do not trim or cut your cast or remove padding from inside of it.  Exercise all joints next to the injury that are not immobilized by the cast or splint. For example, if you have a long leg cast, exercise the hip joint and toes. If you have an arm cast or splint, exercise the shoulder, elbow, thumb, and fingers.  Elevate your injured arm or leg on 1 or 2 pillows for the first 1 to 3 days to decrease  swelling and pain.It is best if you can comfortably elevate your cast so it is higher than your heart. SEEK MEDICAL CARE IF:  1. Your cast or splint cracks. 2. Your cast or splint is too tight or too loose. 3. You have unbearable itching inside the cast. 4. Your cast becomes wet or develops a soft spot or area. 5. You have a bad smell coming from inside your cast. 6. You get an object stuck under your cast. 7. Your skin around the cast becomes red or raw. 8. You have new pain or worsening pain after the cast has been applied. SEEK IMMEDIATE MEDICAL CARE IF:  1. You have fluid leaking through the cast. 2. You are unable to move your fingers or toes. 3. You have discolored (blue or white), cool, painful, or very swollen fingers or toes beyond the cast. 4. You have tingling or numbness around the injured area. 5. You have severe pain or pressure under the cast. 6. You have any difficulty with your breathing or have shortness of breath. 7. You have chest pain. Document Released: 05/11/2000 Document Revised: 03/04/2013 Document Reviewed: 11/20/2012 [facility] Patient Information 2015 [place], [place]. This information is not intended to replace advice given to you by your health care  provider. Make sure you discuss any questions you have with your health care provider. Crutch Use Crutches are used to take weight off one of your legs or feet when you stand or walk. It is important to use crutches that fit properly. When fitted properly:  Each crutch should be 2-3 finger widths below the armpit.  Your weight should be supported by your hand, and not by resting the armpit on the crutch.  RISKS AND COMPLICATIONS Damage to the nerves that extend from your armpit to your hand and arm. To prevent this from happening, make sure your crutches fit properly and do not put pressure on your armpit when using them. HOW TO USE YOUR CRUTCHES If you have been instructed to use partial weight bearing, apply  (bear) the amount of weight as your health care provider suggests. Do not bear weight in an amount that causes pain to the area of injury. Walking 9. Step with the crutches. 10. Swing the healthy leg slightly ahead of the crutches. Going Up Steps If there is no handrail: 8. Step up with the healthy leg. 9. Step up with the crutches and injured leg. 10. Continue in this way. If there is a handrail: 1. Hold both crutches in one hand. 2. Place your free hand on the handrail. 3. While putting your weight on your arms, lift your healthy leg to the step. 4. Bring the crutches and the injured leg up to that step. 5. Continue in this way. Going Down Steps Be very careful, as going down stairs with crutches is very challenging. If there is no handrail: 1. Step down with the injured leg and crutches. 2. Step down with the healthy leg. If there is a handrail: 1. Place your hand on the handrail. 2. Hold both crutches with your free hand. 3. Lower your injured leg and crutch to the step below you. Make sure to keep the crutch tips in the center of the step, never on the edge. 4. Lower your healthy leg to that step. 5. Continue in this way. Standing Up 1. Hold the injured leg forward. 2. Grab the armrest with one hand and the top of the crutches with the other hand. 3. Using these supports, pull yourself up to a standing position. Sitting Down 1. Hold the injured leg forward. 2. Grab the armrest with one hand and the top of the crutches with the other hand. 3. Lower yourself to a sitting position. SEEK MEDICAL CARE IF: You still feel unsteady on your feet.Fibular Fracture, Ankle, Adult, Treated With or Without Immobilization A fibular fracture at your ankle is a break (fracture) bone in the smallest of the two bones in your lower leg, located on the outside of your leg (fibula) close to the area at your ankle joint. CAUSES Rolling your ankle. Twisting your ankle. Extreme flexing or extending  of your foot. Severe force on your ankle as when falling from a distance. RISK FACTORS Jumping activities. Participation in sports. Osteoporosis. Advanced age. Previous ankle injuries. SIGNS AND SYMPTOMS Pain. Swelling. Inability to put weight on injured ankle. Bruising. Bone deformities at site of injury. DIAGNOSIS  This fracture is diagnosed with the help of an X-ray exam. TREATMENT  If the fractured bone did not move out of place it usually will heal without problems and does casting or splinting. If immobilization is needed for comfort or the fractured bone moved out of place and will not heal properly with immobilization, a cast or splint will be used.  HOME CARE INSTRUCTIONS  Apply ice to the area of injury: Put ice in a plastic bag. Place a towel between your skin and the bag. Leave the ice on for 20 minutes, 2-3 times a day. Use crutches as directed. Resume walking without crutches as directed by your health care provider. Only take over-the-counter or prescription medicines for pain, discomfort, or fever as directed by your health care provider. If you have a removable splint or boot, do not remove the boot unless directed by your health care provider. SEEK MEDICAL CARE IF:  You have continued pain or more swelling The medications do not control the pain. SEEK IMMEDIATE MEDICAL CARE IF: You develop severe pain in the leg or foot. Your skin or nails below the injury turn blue or grey or feel cold or numb. MAKE SURE YOU:  Understand these instructions. Will watch your condition. Will get help right away if you are not doing well or get worse. Document Released: 05/14/2005 Document Revised: 03/04/2013 Document Reviewed: 12/24/2012 [facility] Patient Information 2015 [place], [place]. This information is not intended to replace advice given to you by your health care provider. Make sure you discuss any questions you have with your health care provider.    You develop new  pain, for example in your armpits, back, shoulder, wrist, or hip.  You develop any numbness or tingling. SEEK IMMEDIATE MEDICAL CARE IF: You fall. Document Released: 05/11/2000 Document Revised: 05/19/2013 Document Reviewed: 01/19/2013 [facility] Patient Information 2015 [place], [place]. This information is not intended to replace advice given to you by your health care provider. Make sure you discuss any questions you have with your health care provider.

## 2015-01-20 NOTE — Progress Notes (Signed)
Orthopedic Tech Progress Note Patient Details:  Ronald [name] [birth year] [id number]  Ortho Devices Type of Ortho Device: Ace wrap, Post (short leg) splint Ortho Device/Splint Location: LLE Ortho Device/Splint Interventions: Ordered, Application   Braulio Bosch 01/20/2015, 6:32 PM

## 2015-01-21 ENCOUNTER — Ambulatory Visit: Payer: Self-pay | Attending: Internal Medicine

## 2015-01-23 ENCOUNTER — Emergency Department (HOSPITAL_BASED_OUTPATIENT_CLINIC_OR_DEPARTMENT_OTHER)
Admission: EM | Admit: 2015-01-23 | Discharge: 2015-01-23 | Disposition: A | Discharge status: Home / Self Care | Payer: Self-pay | Attending: Emergency Medicine | Admitting: Emergency Medicine

## 2015-01-23 ENCOUNTER — Encounter (HOSPITAL_BASED_OUTPATIENT_CLINIC_OR_DEPARTMENT_OTHER): Payer: Self-pay | Admitting: Emergency Medicine

## 2015-01-23 DIAGNOSIS — Z79899 Other long term (current) drug therapy: Secondary | ICD-10-CM | POA: Insufficient documentation

## 2015-01-23 DIAGNOSIS — Z7982 Long term (current) use of aspirin: Secondary | ICD-10-CM | POA: Insufficient documentation

## 2015-01-23 DIAGNOSIS — I10 Essential (primary) hypertension: Secondary | ICD-10-CM | POA: Insufficient documentation

## 2015-01-23 DIAGNOSIS — R319 Hematuria, unspecified: Secondary | ICD-10-CM | POA: Insufficient documentation

## 2015-01-23 DIAGNOSIS — Z720 Tobacco use: Secondary | ICD-10-CM | POA: Insufficient documentation

## 2015-01-23 DIAGNOSIS — Z880 Allergy status to penicillin: Secondary | ICD-10-CM | POA: Insufficient documentation

## 2015-01-23 LAB — CBC WITH DIFFERENTIAL/PLATELET
BASOS ABS: 0.1 10*3/uL (ref 0.0–0.1)
BASOS PCT: 1 % (ref 0–1)
Eosinophils Absolute: 0.4 10*3/uL (ref 0.0–0.7)
Eosinophils Relative: 6 % — ABNORMAL HIGH (ref 0–5)
HEMATOCRIT: 39.4 % (ref 39.0–52.0)
HEMOGLOBIN: 13.5 g/dL (ref 13.0–17.0)
LYMPHS PCT: 40 % (ref 12–46)
Lymphs Abs: 2.5 10*3/uL (ref 0.7–4.0)
MCH: 30.5 pg (ref 26.0–34.0)
MCHC: 34.3 g/dL (ref 30.0–36.0)
MCV: 88.9 fL (ref 78.0–100.0)
MONO ABS: 0.5 10*3/uL (ref 0.1–1.0)
MONOS PCT: 8 % (ref 3–12)
NEUTROS ABS: 2.9 10*3/uL (ref 1.7–7.7)
NEUTROS PCT: 45 % (ref 43–77)
Platelets: 243 10*3/uL (ref 150–400)
RBC: 4.43 MIL/uL (ref 4.22–5.81)
RDW: 12.1 % (ref 11.5–15.5)
WBC: 6.3 10*3/uL (ref 4.0–10.5)

## 2015-01-23 LAB — BASIC METABOLIC PANEL
ANION GAP: 11 (ref 5–15)
BUN: 13 mg/dL (ref 6–20)
CO2: 23 mmol/L (ref 22–32)
Calcium: 9.2 mg/dL (ref 8.9–10.3)
Chloride: 105 mmol/L (ref 101–111)
Creatinine, Ser: 1.17 mg/dL (ref 0.61–1.24)
GFR calc non Af Amer: >60 mL/min (ref >60)
GLUCOSE: 90 mg/dL (ref 65–99)
POTASSIUM: 3.5 mmol/L (ref 3.5–5.1)
Sodium: 139 mmol/L (ref 135–145)

## 2015-01-23 LAB — URINALYSIS, ROUTINE W REFLEX MICROSCOPIC
Bilirubin Urine: NEGATIVE
GLUCOSE, UA: NEGATIVE mg/dL
Ketones, ur: NEGATIVE mg/dL
LEUKOCYTES UA: NEGATIVE
NITRITE: NEGATIVE
PH: 5.5 (ref 5.0–8.0)
Protein, ur: NEGATIVE mg/dL
SPECIFIC GRAVITY, URINE: 1.008 (ref 1.005–1.030)
Urobilinogen, UA: 0.2 mg/dL (ref 0.0–1.0)

## 2015-01-23 LAB — URINE MICROSCOPIC-ADD ON

## 2015-01-23 LAB — TROPONIN I: Troponin I: <0.03 ng/mL (ref <0.031)

## 2015-01-23 MED ORDER — LISINOPRIL 20 MG PO TABS: 10.0000 mg | ORAL_TABLET | Freq: Every day | ORAL | Status: DC

## 2015-01-23 MED ORDER — CIPROFLOXACIN HCL 500 MG PO TABS: 500.0000 mg | ORAL_TABLET | Freq: Two times a day (BID) | ORAL | Status: DC

## 2015-01-23 MED ORDER — HYDROCHLOROTHIAZIDE 25 MG PO TABS: 12.5000 mg | ORAL_TABLET | Freq: Every day | ORAL | Status: DC

## 2015-01-23 MED ORDER — CIPROFLOXACIN HCL 500 MG PO TABS
500.0000 mg | ORAL_TABLET | Freq: Once | ORAL | Status: AC
  Administered 2015-01-23: 500 mg via ORAL
  Filled 2015-01-23: qty 1

## 2015-01-23 NOTE — Discharge Instructions (Signed)

## 2015-01-23 NOTE — ED Provider Notes (Signed)
CSN: [id number]     Arrival date & time 01/23/15  0358 History   First MD Initiated Contact with Patient 01/23/15 0413     Chief Complaint  Patient presents with  . Hematuria     (Consider location/radiation/quality/duration/timing/severity/associated sxs/prior Treatment) HPI  This is a 44 year old male who recently injured his left lower leg. He was diagnosed with a proximal fibular fracture. He had been wearing a splint but took her off due to it exacerbating his pain. He also recently had Doppler ultrasound of the left lower extremity which was negative for DVT. He has been ambulating with crutches. He has taken a total of 6 BC powders and 2 Percocet yesterday.  He is here with hematuria that he first noticed about 10:30 yesterday evening. He noticed blood in his urine during initial stream but his urine subsequently cleared. He later had a bowel movement and noticed blood at the tip of his penis. He denies flank pain, abdominal pain, dysuria, penile discharge, nausea or vomiting. He states sitting the blood made him very anxious which caused him to feel some tightness in his chest. The tightness is nearly resolved. He denies shortness of breath.  Past Medical History  Diagnosis Date  . Hypertension    Past Surgical History  Procedure Laterality Date  . Arm surgery      torn bicept   Family History  Problem Relation Age of Onset  . Hypertension Mother   . Hypertension Father   . Cancer Other    Social History  Substance Use Topics  . Smoking status: Current Some Day Smoker -- 0.50 packs/day    Types: Cigarettes  . Smokeless tobacco: None  . Alcohol Use: 0.0 oz/week    0 Standard drinks or equivalent per week     Comment: occ    Review of Systems  All other systems reviewed and are negative.   Allergies  Penicillins and Sulfa antibiotics  Home Medications   Prior to Admission medications   Medication Sig Start Date End Date Taking? Authorizing Provider  aspirin 81  MG chewable tablet Chew 1 tablet (81 mg total) by mouth daily. 07/13/14   Nicole Pisciotta, PA-C  hydrochlorothiazide (HYDRODIURIL) 12.5 MG tablet Take 1 tablet (12.5 mg total) by mouth daily. 07/13/14   Nicole Pisciotta, PA-C  lisinopril-hydrochlorothiazide (PRINZIDE,ZESTORETIC) 10-12.5 MG per tablet Take 1 tablet by mouth daily. 06/15/14   [name], MD  oxyCODONE-acetaminophen (PERCOCET/ROXICET) 5-325 MG per tablet Take 1 tablet by mouth every 4 (four) hours as needed for severe pain. May take 2 tablets PO q 6 hours for severe pain - Do not take with Tylenol as this tablet already contains tylenol 01/20/15   [name], PA-C   BP 183/110 mmHg  Pulse 80  Temp(Src) 97.7 F (36.5 C) (Oral)  Ht 5\' 9"  (1.753 m)  Wt 190 lb (86.183 kg)  BMI 28.05 kg/m2  SpO2 99%   Physical Exam  General: Well-developed, well-nourished male in no acute distress; appearance consistent with age of record HENT: normocephalic; atraumatic Eyes: pupils equal, round and reactive to light; extraocular muscles intact Neck: supple Heart: regular rate and rhythm; no murmurs, rubs or gallops Lungs: clear to auscultation bilaterally Abdomen: soft; nondistended; nontender; no masses or hepatosplenomegaly; bowel sounds present Rectal: Normal sphincter tone; prostate nontender but pressure on prostate is felt in the tip of his penis Extremities: No deformity; full range of motion; pulses normal Neurologic: Awake, alert and oriented; motor function intact in all extremities and symmetric; no facial  droop Skin: Warm and dry Psychiatric: Normal mood and affect   ED Course  Procedures (including critical care time)   EKG Interpretation   Date/Time:  Sunday January 23 2015 04:06:21 EDT Ventricular Rate:  82 PR Interval:  152 QRS Duration: 94 QT Interval:  374 QTC Calculation: 436 R Axis:   81 Text Interpretation:  Normal sinus rhythm Minimal voltage criteria for  LVH, may be normal variant T wave abnormality,  consider anterolateral  ischemia Abnormal ECG T wave inversions not seen on 07/13/14 but present  (and more pronounced) on 12/24/2013 Confirmed by Ngozi Alvidrez  MD, Jenny Reichmann ([id number]) on  01/23/2015 4:11:01 AM      MDM  Nursing notes and vitals signs, including pulse oximetry, reviewed.  Summary of this visit's results, reviewed by myself:  Labs:  Results for orders placed or performed during the hospital encounter of 01/23/15 (from the past 24 hour(s))  Urinalysis, Routine w reflex microscopic (not at [facility])     Status: Abnormal   Collection Time: 01/23/15  4:23 AM  Result Value Ref Range   Color, Urine YELLOW YELLOW   APPearance CLEAR CLEAR   Specific Gravity, Urine 1.008 1.005 - 1.030   pH 5.5 5.0 - 8.0   Glucose, UA NEGATIVE NEGATIVE mg/dL   Hgb urine dipstick LARGE (A) NEGATIVE   Bilirubin Urine NEGATIVE NEGATIVE   Ketones, ur NEGATIVE NEGATIVE mg/dL   Protein, ur NEGATIVE NEGATIVE mg/dL   Urobilinogen, UA 0.2 0.0 - 1.0 mg/dL   Nitrite NEGATIVE NEGATIVE   Leukocytes, UA NEGATIVE NEGATIVE  Urine microscopic-add on     Status: None   Collection Time: 01/23/15  4:23 AM  Result Value Ref Range   Squamous Epithelial / LPF RARE RARE   WBC, UA 0-2 <3 WBC/hpf   RBC / HPF 21-50 <3 RBC/hpf   Bacteria, UA RARE RARE  Basic metabolic panel     Status: None   Collection Time: 01/23/15  4:50 AM  Result Value Ref Range   Sodium 139 135 - 145 mmol/L   Potassium 3.5 3.5 - 5.1 mmol/L   Chloride 105 101 - 111 mmol/L   CO2 23 22 - 32 mmol/L   Glucose, Bld 90 65 - 99 mg/dL   BUN 13 6 - 20 mg/dL   Creatinine, Ser 1.17 0.61 - 1.24 mg/dL   Calcium 9.2 8.9 - 10.3 mg/dL   GFR calc non Af Amer >60 >60 mL/min   GFR calc Af Amer >60 >60 mL/min   Anion gap 11 5 - 15  CBC with Differential/Platelet     Status: Abnormal   Collection Time: 01/23/15  4:50 AM  Result Value Ref Range   WBC 6.3 4.0 - 10.5 K/uL   RBC 4.43 4.22 - 5.81 MIL/uL   Hemoglobin 13.5 13.0 - 17.0 g/dL   HCT 39.4 39.0 - 52.0 %   MCV 88.9  78.0 - 100.0 fL   MCH 30.5 26.0 - 34.0 pg   MCHC 34.3 30.0 - 36.0 g/dL   RDW 12.1 11.5 - 15.5 %   Platelets 243 150 - 400 K/uL   Neutrophils Relative % 45 43 - 77 %   Neutro Abs 2.9 1.7 - 7.7 K/uL   Lymphocytes Relative 40 12 - 46 %   Lymphs Abs 2.5 0.7 - 4.0 K/uL   Monocytes Relative 8 3 - 12 %   Monocytes Absolute 0.5 0.1 - 1.0 K/uL   Eosinophils Relative 6 (H) 0 - 5 %   Eosinophils Absolute 0.4 0.0 -  0.7 K/uL   Basophils Relative 1 0 - 1 %   Basophils Absolute 0.1 0.0 - 0.1 K/uL  Troponin I     Status: None   Collection Time: 01/23/15  4:50 AM  Result Value Ref Range   Troponin I <0.03 <0.031 ng/mL   Hematuria with initial voiding that clears is unlikely to be related to renal or ureteral pathology. Presence of blood at the tip of the penis following defecation most likely represents a prostatic origin or seminal vesicle origin. I suspect the hematuria is due to early prostatitis and we will treat accordingly. Urine has been sent for culture as well as GC and chlamydia. The patient states he is married and monogamous. He was advised to discontinue BC powders as the aspirin can exacerbate bleeding. If the bleeding persists despite treatment with antibiotics we will refer him to urology. He is also referred requesting a referral to orthopedics.   Shanon Rosser, MD 01/23/15 [phone]

## 2015-01-23 NOTE — ED Notes (Signed)
Pt c/o chest tightness, denies shortness of breath, nausea, vomiting, dizziness, weakness or other distress. States he has seen blood in urine today also. Seen recently for "broken ankle and fibula". States he has had 6 packs of BC powder and 2 percocet.

## 2015-01-24 LAB — GC/CHLAMYDIA PROBE AMP (~~LOC~~) NOT AT ARMC
CHLAMYDIA, DNA PROBE: NEGATIVE
Neisseria Gonorrhea: NEGATIVE

## 2015-01-24 LAB — URINE CULTURE: Culture: NO GROWTH

## 2015-01-24 NOTE — Telephone Encounter (Signed)
Noted - advised previously to get the card ASAP so we can go ahead with MRI and likely ortho referral to discuss fixation of maisonneuve fracture.

## 2015-02-08 ENCOUNTER — Encounter (HOSPITAL_BASED_OUTPATIENT_CLINIC_OR_DEPARTMENT_OTHER): Payer: Self-pay | Admitting: Emergency Medicine

## 2015-02-08 ENCOUNTER — Emergency Department (HOSPITAL_BASED_OUTPATIENT_CLINIC_OR_DEPARTMENT_OTHER)
Admission: EM | Admit: 2015-02-08 | Discharge: 2015-02-08 | Disposition: A | Discharge status: Home / Self Care | Payer: Self-pay | Attending: Emergency Medicine | Admitting: Emergency Medicine

## 2015-02-08 DIAGNOSIS — I10 Essential (primary) hypertension: Secondary | ICD-10-CM | POA: Insufficient documentation

## 2015-02-08 DIAGNOSIS — Z79899 Other long term (current) drug therapy: Secondary | ICD-10-CM | POA: Insufficient documentation

## 2015-02-08 DIAGNOSIS — Z720 Tobacco use: Secondary | ICD-10-CM | POA: Insufficient documentation

## 2015-02-08 DIAGNOSIS — Z792 Long term (current) use of antibiotics: Secondary | ICD-10-CM | POA: Insufficient documentation

## 2015-02-08 DIAGNOSIS — Z4689 Encounter for fitting and adjustment of other specified devices: Secondary | ICD-10-CM | POA: Insufficient documentation

## 2015-02-08 DIAGNOSIS — Z880 Allergy status to penicillin: Secondary | ICD-10-CM | POA: Insufficient documentation

## 2015-02-08 DIAGNOSIS — Z760 Encounter for issue of repeat prescription: Secondary | ICD-10-CM | POA: Insufficient documentation

## 2015-02-08 DIAGNOSIS — Z8781 Personal history of (healed) traumatic fracture: Secondary | ICD-10-CM | POA: Insufficient documentation

## 2015-02-08 DIAGNOSIS — M25572 Pain in left ankle and joints of left foot: Secondary | ICD-10-CM | POA: Insufficient documentation

## 2015-02-08 MED ORDER — HYDROCHLOROTHIAZIDE 25 MG PO TABS: 12.5000 mg | ORAL_TABLET | Freq: Every day | ORAL | Status: DC

## 2015-02-08 MED ORDER — HYDROCHLOROTHIAZIDE 25 MG PO TABS
12.5000 mg | ORAL_TABLET | Freq: Once | ORAL | Status: AC
  Administered 2015-02-08: 12.5 mg via ORAL
  Filled 2015-02-08: qty 1

## 2015-02-08 NOTE — ED Notes (Signed)
Pt has cast on left ankle and foot.  Sts it was supposed to come off 2 weeks ago but "I don't have the funds" to go to the doctor.  Pt sts he needs it off so he can go back to work.  He has already cut much of it off. Sts it was a sprain, not a fracture.

## 2015-02-08 NOTE — Discharge Instructions (Signed)
Please read and follow all provided instructions.  Your diagnoses today include:  1. Encounter for cast removal   2. Essential hypertension   3. Medication refill     Tests performed today include:  Vital signs. See below for your results today.   Medications prescribed:   HCTZ - medication for high blood pressure  Take any prescribed medications only as directed.  Home care instructions:   Follow any educational materials contained in this packet   Follow-up instructions: Please follow-up with your orthopedist. As we discussed, you could have a significant injury which would require surgery. This decision needs to be made under the guidance of an orthopedic specialist.    Return instructions:   Please return if your toes or feet are numb or tingling, appear gray or blue, or you have severe pain (also elevate the leg and loosen splint or wrap if you were given one)  Please return to the Emergency Department if you experience worsening symptoms.   Please return if you have any other emergent concerns.  Additional Information:  Your vital signs today were: BP 147/103 mmHg   Pulse 78   Temp(Src) 98 F (36.7 C) (Oral)   Resp 18   Ht 5\' 9"  (1.753 m)   Wt 190 lb (86.183 kg)   BMI 28.05 kg/m2   SpO2 98% If your blood pressure (BP) was elevated above 135/85 this visit, please have this repeated by your doctor within one month. --------------

## 2015-02-08 NOTE — ED Provider Notes (Signed)
CSN: [id number]     Arrival date & time 02/08/15  1852 History   First MD Initiated Contact with Patient 02/08/15 2037     Chief Complaint  Patient presents with  . Cast Removal     (Consider location/radiation/quality/duration/timing/severity/associated sxs/prior Treatment) HPI Comments: Patient presents with request for removal of cast on his left ankle and foot. Patient was initially seen in this ED and then subsequently by Dr. Barbaraann Barthel. Patient was diagnosed with a proximal fibular fracture and there was concern for maisonneuve fracture. Patient was unable to get an MRI due to insurance issues. Patient subsequently followed up with an orthopedic physician in [facility]. Patient states that he had multiple x-rays performed. He was told that it was a very bad sprain and was placed in a short leg cast. Patient states that he was supposed to get this cast off 2 weeks ago but states that he doesn't have the money to go back and get it removed. Hence he is here today requesting removal. He has been bearing weight on the leg with the cast in place and has already removed part of it himself.   Patient also requests refill blood pressure medication. He was previously on 12.5 mg of HCTZ daily.  The history is provided by the patient and medical records.    Past Medical History  Diagnosis Date  . Hypertension    Past Surgical History  Procedure Laterality Date  . Arm surgery      torn bicept   Family History  Problem Relation Age of Onset  . Hypertension Mother   . Hypertension Father   . Cancer Other    Social History  Substance Use Topics  . Smoking status: Current Every Day Smoker -- 0.50 packs/day    Types: Cigarettes  . Smokeless tobacco: None  . Alcohol Use: 0.0 oz/week    0 Standard drinks or equivalent per week     Comment: occ    Review of Systems  Constitutional: Negative for activity change.  Musculoskeletal: Positive for arthralgias. Negative for back pain, joint  swelling, gait problem and neck pain.  Skin: Negative for wound.  Neurological: Negative for weakness and numbness.      Allergies  Penicillins and Sulfa antibiotics  Home Medications   Prior to Admission medications   Medication Sig Start Date End Date Taking? Authorizing Provider  ciprofloxacin (CIPRO) 500 MG tablet Take 1 tablet (500 mg total) by mouth 2 (two) times daily. 01/23/15   John Molpus, MD  hydrochlorothiazide (HYDRODIURIL) 25 MG tablet Take 0.5 tablets (12.5 mg total) by mouth daily. 01/23/15   John Molpus, MD  lisinopril (PRINIVIL,ZESTRIL) 20 MG tablet Take 0.5 tablets (10 mg total) by mouth daily. 01/23/15   Shanon Rosser, MD  oxyCODONE-acetaminophen (PERCOCET/ROXICET) 5-325 MG per tablet Take 1 tablet by mouth every 4 (four) hours as needed for severe pain. May take 2 tablets PO q 6 hours for severe pain - Do not take with Tylenol as this tablet already contains tylenol 01/20/15   [name], PA-C   BP 147/103 mmHg  Pulse 78  Temp(Src) 98 F (36.7 C) (Oral)  Resp 18  Ht 5\' 9"  (1.753 m)  Wt 190 lb (86.183 kg)  BMI 28.05 kg/m2  SpO2 98%   Physical Exam  Constitutional: He appears well-developed and well-nourished.  HENT:  Head: Normocephalic and atraumatic.  Eyes: Conjunctivae are normal.  Neck: Normal range of motion. Neck supple.  Cardiovascular: Normal pulses.   Pulses:  Dorsalis pedis pulses are 2+ on the right side, and 2+ on the left side.  Musculoskeletal: He exhibits tenderness. He exhibits no edema.       Left knee: Normal.       Left ankle: He exhibits decreased range of motion and swelling. Tenderness (mild). Lateral malleolus, medial malleolus and proximal fibula (minimal) tenderness found. No head of 5th metatarsal tenderness found.       Left upper leg: Normal.       Left lower leg: Normal.  Neurological: He is alert. No sensory deficit.  Motor, sensation, and vascular distal to the injury is fully intact.   Skin: Skin is warm and dry.   Psychiatric: He has a normal mood and affect.  Nursing note and vitals reviewed.   ED Course  Procedures (including critical care time) Labs Review Labs Reviewed - No data to display  Imaging Review No results found. I have personally reviewed and evaluated these images and lab results as part of my medical decision-making.   EKG Interpretation None       9:24 PM Patient seen and examined.   Vital signs reviewed and are as follows: BP 147/103 mmHg  Pulse 78  Temp(Src) 98 F (36.7 C) (Oral)  Resp 18  Ht 5\' 9"  (1.753 m)  Wt 190 lb (86.183 kg)  BMI 28.05 kg/m2  SpO2 98%  Discussed with patient Dr. Ericka Pontiff concern for [facility] fracture. I discussed that I do not have access to the records of the second orthopedic doctor. I cannot definitively say that he does not have an injury which may require surgery. Patient states that he would like the cast removed tonight. I strongly encouraged him to follow-up with his orthopedic doctor for definitive management. I cannot formally clear him to return to work. He states he cannot afford more medical care for this unless he is able to work, which he cannot do with the cast in place.   Cast removed without, location. Patient ambulatory. Patient with normal sensation and motor function and foot.  MDM   Final diagnoses:  Encounter for cast removal  Essential hypertension  Medication refill   Cast removal: Treatment as above.  Hypertension: Patient given refill of antihypertensives per his request.    Carlisle Cater, PA-C 02/08/15 2137  Blanchie Dessert, MD 02/08/15 [phone]

## 2015-02-08 NOTE — ED Notes (Signed)
PA at bedside.

## 2015-04-19 ENCOUNTER — Encounter (HOSPITAL_COMMUNITY): Payer: Self-pay | Admitting: *Deleted

## 2015-04-19 ENCOUNTER — Emergency Department (HOSPITAL_COMMUNITY)
Admission: EM | Admit: 2015-04-19 | Discharge: 2015-04-19 | Disposition: A | Discharge status: Home / Self Care | Payer: Self-pay | Attending: Physician Assistant | Admitting: Physician Assistant

## 2015-04-19 DIAGNOSIS — H6691 Otitis media, unspecified, right ear: Secondary | ICD-10-CM | POA: Insufficient documentation

## 2015-04-19 DIAGNOSIS — Z880 Allergy status to penicillin: Secondary | ICD-10-CM | POA: Insufficient documentation

## 2015-04-19 DIAGNOSIS — Z792 Long term (current) use of antibiotics: Secondary | ICD-10-CM | POA: Insufficient documentation

## 2015-04-19 DIAGNOSIS — F17210 Nicotine dependence, cigarettes, uncomplicated: Secondary | ICD-10-CM | POA: Insufficient documentation

## 2015-04-19 DIAGNOSIS — Z79899 Other long term (current) drug therapy: Secondary | ICD-10-CM | POA: Insufficient documentation

## 2015-04-19 DIAGNOSIS — I10 Essential (primary) hypertension: Secondary | ICD-10-CM | POA: Insufficient documentation

## 2015-04-19 DIAGNOSIS — R0981 Nasal congestion: Secondary | ICD-10-CM | POA: Insufficient documentation

## 2015-04-19 DIAGNOSIS — R590 Localized enlarged lymph nodes: Secondary | ICD-10-CM | POA: Insufficient documentation

## 2015-04-19 MED ORDER — ONDANSETRON HCL 4 MG PO TABS: 4.0000 mg | ORAL_TABLET | Freq: Three times a day (TID) | ORAL | Status: DC | PRN

## 2015-04-19 MED ORDER — IBUPROFEN 800 MG PO TABS: 800.0000 mg | ORAL_TABLET | Freq: Three times a day (TID) | ORAL | Status: DC

## 2015-04-19 MED ORDER — AZITHROMYCIN 250 MG PO TABS: 250.0000 mg | ORAL_TABLET | Freq: Every day | ORAL | Status: AC

## 2015-04-19 NOTE — ED Notes (Signed)
Pt states that he children were diagnosed with a virus last week and he thinks he has it; pt c/o sore throat, nausea and just not feeling well; pt c/o cough and mild congestion; and mild rt earache

## 2015-04-19 NOTE — Discharge Instructions (Signed)
Please only take antibiotics if your ear pain worsens. We have given you a Z-Pak. Use Zofran as needed for nausea.   Otitis Media, Adult Otitis media is redness, soreness, and puffiness (swelling) in the space just behind your eardrum (middle ear). It may be caused by allergies or infection. It often happens along with a cold. HOME CARE  Take your medicine as told. Finish it even if you start to feel better.  Only take over-the-counter or prescription medicines for pain, discomfort, or fever as told by your doctor.  Follow up with your doctor as told. GET HELP IF:  You have otitis media only in one ear, or bleeding from your nose, or both.  You notice a lump on your neck.  You are not getting better in 3-5 days.  You feel worse instead of better. GET HELP RIGHT AWAY IF:   You have pain that is not helped with medicine.  You have puffiness, redness, or pain around your ear.  You get a stiff neck.  You cannot move part of your face (paralysis).  You notice that the bone behind your ear hurts when you touch it. MAKE SURE YOU:   Understand these instructions.  Will watch your condition.  Will get help right away if you are not doing well or get worse.   This information is not intended to replace advice given to you by your health care provider. Make sure you discuss any questions you have with your health care provider.   Document Released: 10/31/2007 Document Revised: 06/04/2014 Document Reviewed: 12/09/2012 Elsevier Interactive Patient Education [employer].

## 2015-04-19 NOTE — ED Provider Notes (Addendum)
CSN: [id number]     Arrival date & time 04/19/15  [id number] History   First MD Initiated Contact with Patient 04/19/15 [phone]     Chief Complaint  Patient presents with  . Sore Throat     (Consider location/radiation/quality/duration/timing/severity/associated sxs/prior Treatment) HPI    Patient a 44 year old male presenting with right ear pain starting 1 hour ago. Patient has also had nausea since Sunday when he ate hot wings that he thinks were bad. Patient had no fever. No diarrhea. No actual vomiting. No difficulty swallowing. No pain upon swallowing. He's been eating and drinking normally.   Past Medical History  Diagnosis Date  . Hypertension    Past Surgical History  Procedure Laterality Date  . Arm surgery      torn bicept   Family History  Problem Relation Age of Onset  . Hypertension Mother   . Hypertension Father   . Cancer Other    Social History  Substance Use Topics  . Smoking status: Current Every Day Smoker -- 0.50 packs/day    Types: Cigarettes  . Smokeless tobacco: None  . Alcohol Use: 0.0 oz/week    0 Standard drinks or equivalent per week     Comment: occ    Review of Systems  Constitutional: Negative for fever, activity change and appetite change.  HENT: Positive for congestion and ear pain.   Respiratory: Negative for shortness of breath.   Cardiovascular: Negative for chest pain.  Gastrointestinal: Negative for abdominal pain.      Allergies  Penicillins and Sulfa antibiotics  Home Medications   Prior to Admission medications   Medication Sig Start Date End Date Taking? Authorizing Provider  ciprofloxacin (CIPRO) 500 MG tablet Take 1 tablet (500 mg total) by mouth 2 (two) times daily. 01/23/15   John Molpus, MD  hydrochlorothiazide (HYDRODIURIL) 25 MG tablet Take 0.5 tablets (12.5 mg total) by mouth daily. 02/08/15   Carlisle Cater, PA-C  lisinopril (PRINIVIL,ZESTRIL) 20 MG tablet Take 0.5 tablets (10 mg total) by mouth daily. 01/23/15   Shanon Rosser, MD  oxyCODONE-acetaminophen (PERCOCET/ROXICET) 5-325 MG per tablet Take 1 tablet by mouth every 4 (four) hours as needed for severe pain. May take 2 tablets PO q 6 hours for severe pain - Do not take with Tylenol as this tablet already contains tylenol 01/20/15   [name], PA-C   BP 140/79 mmHg  Pulse 64  Temp(Src) 98.2 F (36.8 C) (Oral)  Resp 18  Ht 5\' 9"  (1.753 m)  Wt 190 lb (86.183 kg)  BMI 28.05 kg/m2  SpO2 100% Physical Exam  Constitutional: He is oriented to person, place, and time. He appears well-nourished.  HENT:  Head: Normocephalic.  Right TM with opacification.  Mild erythema and bilateral turbinates.  Posterior pharynx shows no erythema no drainage no exudates.  \One swollen lymph node cervical R neck  Eyes: Conjunctivae are normal.  Cardiovascular: Normal rate.   Pulmonary/Chest: Effort normal. He has no wheezes. He has no rales.  Abdominal: There is no tenderness.  Neurological: He is oriented to person, place, and time.  Skin: Skin is warm and dry. He is not diaphoretic.  Psychiatric: He has a normal mood and affect. His behavior is normal.    ED Course  Procedures (including critical care time) Labs Review Labs Reviewed - No data to display  Imaging Review No results found. I have personally reviewed and evaluated these images and lab results as part of my medical decision-making.   EKG Interpretation None  MDM   Final diagnoses:  None   patient is a 44 year old African-American male presenting today with cold symptoms. Patient states that he ate hot wings with his family 2 days ago and has had some nausea since then (his whole family had nausea/vomiting) Today he woke up with some ear pain on the right as well as mild swelling of cervical lymph node. Posterior pharynx shows no erythema.  Suspect cold, viral ear infection.  We will give watch and wait antibiotics if it continues to hurt he can take a course of of antibiotics.  We'll give Zofran to help with nausea.  Marie Borowski Julio Alm, MD 04/19/15 [place], MD 04/19/15 [phone]

## 2015-05-18 ENCOUNTER — Emergency Department (HOSPITAL_BASED_OUTPATIENT_CLINIC_OR_DEPARTMENT_OTHER): Payer: Self-pay

## 2015-05-18 ENCOUNTER — Encounter (HOSPITAL_BASED_OUTPATIENT_CLINIC_OR_DEPARTMENT_OTHER): Payer: Self-pay | Admitting: *Deleted

## 2015-05-18 ENCOUNTER — Emergency Department (HOSPITAL_BASED_OUTPATIENT_CLINIC_OR_DEPARTMENT_OTHER)
Admission: EM | Admit: 2015-05-18 | Discharge: 2015-05-18 | Disposition: A | Discharge status: Home / Self Care | Payer: Self-pay | Attending: Emergency Medicine | Admitting: Emergency Medicine

## 2015-05-18 DIAGNOSIS — F17210 Nicotine dependence, cigarettes, uncomplicated: Secondary | ICD-10-CM | POA: Insufficient documentation

## 2015-05-18 DIAGNOSIS — I10 Essential (primary) hypertension: Secondary | ICD-10-CM | POA: Insufficient documentation

## 2015-05-18 DIAGNOSIS — Z880 Allergy status to penicillin: Secondary | ICD-10-CM | POA: Insufficient documentation

## 2015-05-18 DIAGNOSIS — Z79899 Other long term (current) drug therapy: Secondary | ICD-10-CM | POA: Insufficient documentation

## 2015-05-18 DIAGNOSIS — J40 Bronchitis, not specified as acute or chronic: Secondary | ICD-10-CM | POA: Insufficient documentation

## 2015-05-18 MED ORDER — ALBUTEROL SULFATE HFA 108 (90 BASE) MCG/ACT IN AERS
1.0000 | INHALATION_SPRAY | RESPIRATORY_TRACT | Status: DC
  Administered 2015-05-18: 1 via RESPIRATORY_TRACT
  Filled 2015-05-18: qty 6.7

## 2015-05-18 MED ORDER — HYDROCHLOROTHIAZIDE 25 MG PO TABS: 12.5000 mg | ORAL_TABLET | Freq: Every day | ORAL | Status: DC

## 2015-05-18 MED ORDER — CHLORPHENIRAMINE-PHENYLEPHRINE 1-3.5 MG/ML PO LIQD: 0.7500 mL | Freq: Four times a day (QID) | ORAL | Status: DC | PRN

## 2015-05-18 MED ORDER — PREDNISONE 50 MG PO TABS
60.0000 mg | ORAL_TABLET | ORAL | Status: AC
  Administered 2015-05-18: 60 mg via ORAL
  Filled 2015-05-18: qty 1

## 2015-05-18 MED ORDER — PREDNISONE 20 MG PO TABS: 60.0000 mg | ORAL_TABLET | Freq: Every day | ORAL | Status: AC

## 2015-05-18 MED ORDER — ALBUTEROL SULFATE (2.5 MG/3ML) 0.083% IN NEBU
5.0000 mg | INHALATION_SOLUTION | Freq: Once | RESPIRATORY_TRACT | Status: AC
  Administered 2015-05-18: 5 mg via RESPIRATORY_TRACT
  Filled 2015-05-18: qty 6

## 2015-05-18 NOTE — ED Provider Notes (Signed)
CSN: [id number]     Arrival date & time 05/18/15  [id number] History   First MD Initiated Contact with Patient 05/18/15 [phone]     No chief complaint on file.    (Consider location/radiation/quality/duration/timing/severity/associated sxs/prior Treatment) HPI patient presents with concern of cough, congestion. Cough began about 5 days ago. Since onset symptoms have been persistent.  There is subjective fever, no objective temperature increase. Patient has had associated abdominal pain, chest pain with coughing, but otherwise no chest or abdominal pain. No vomiting, no diarrhea. Patient has been unable to sleep secondary to coughing.  Patient states that his children were sick about one week ago.  Patient is trying to stop smoking.  No relief with OTC cough medication.   Smoking cessation provided, particularly in light of this patient's evaluation in the ED.   Past Medical History  Diagnosis Date  . Hypertension    Past Surgical History  Procedure Laterality Date  . Arm surgery      torn bicept   Family History  Problem Relation Age of Onset  . Hypertension Mother   . Hypertension Father   . Cancer Other    Social History  Substance Use Topics  . Smoking status: Current Every Day Smoker -- 0.50 packs/day    Types: Cigarettes  . Smokeless tobacco: None  . Alcohol Use: 0.0 oz/week    0 Standard drinks or equivalent per week     Comment: occ    Review of Systems  Constitutional:       Per HPI, otherwise negative  HENT:       Per HPI, otherwise negative  Respiratory:       Per HPI, otherwise negative  Cardiovascular:       Per HPI, otherwise negative  Gastrointestinal: Negative for vomiting.  Endocrine:       Negative aside from HPI  Genitourinary:       Neg aside from HPI   Musculoskeletal:       Per HPI, otherwise negative  Skin: Negative.   Neurological: Negative for syncope.      Allergies  Penicillins and Sulfa antibiotics  Home Medications    Prior to Admission medications   Medication Sig Start Date End Date Taking? Authorizing Provider  hydrochlorothiazide (HYDRODIURIL) 25 MG tablet Take 0.5 tablets (12.5 mg total) by mouth daily. 02/08/15  Yes Joshua Geiple, PA-C   BP 131/84 mmHg  Pulse 73  Temp(Src) 98.3 F (36.8 C) (Oral)  Resp 18  Ht 5\' 8"  (1.727 m)  Wt 190 lb (86.183 kg)  BMI 28.90 kg/m2  SpO2 98% Physical Exam  Constitutional: He is oriented to person, place, and time. He appears well-developed. No distress.  HENT:  Head: Normocephalic and atraumatic.  Eyes: Conjunctivae and EOM are normal.  Cardiovascular: Normal rate and regular rhythm.   Pulmonary/Chest: Effort normal. No stridor. Tachypnea noted. He has decreased breath sounds.  Abdominal: He exhibits no distension.  Musculoskeletal: He exhibits no edema.  Neurological: He is alert and oriented to person, place, and time.  Skin: Skin is warm and dry.  Psychiatric: He has a normal mood and affect.  Nursing note and vitals reviewed.   ED Course  Procedures (including critical care time) Labs Review Labs Reviewed - No data to display  Imaging Review Dg Chest 2 View  05/18/2015  CLINICAL DATA:  Cough for 5 days EXAM: CHEST - 2 VIEW COMPARISON:  07/13/2014 FINDINGS: The heart size and mediastinal contours are within normal limits. Both lungs are clear.  The visualized skeletal structures are unremarkable. IMPRESSION: No active disease. Electronically Signed   By: Inez Catalina M.D.   On: 05/18/2015 10:20   I have personally reviewed and evaluated these images and lab results as part of my medical decision-making.  On repeat exam the patient is in no distress, vital signs remain similar. We discussed all findings, the ED for return precautions, outpatient follow-up. Patient started on a course of steroids, albuterol   MDM  Smoker presents with 5 days of cough. The patient has no evidence for pneumonia, improved with albuterol.  Patient started on  steroids as well. Patient discharged in stable condition to follow-up with primary care.   Carmin Muskrat, MD 05/18/15 1048

## 2015-05-18 NOTE — Discharge Instructions (Signed)
As discussed, with your diagnosis of bronchitis it is important that you monitor your condition carefully. Please use the provided albuterol inhaler every 4 hours for the next 2 days.  Please obtain your prescription, and to take that medication as well.  Return here for any concerning changes in your condition.

## 2015-05-18 NOTE — ED Notes (Signed)
C/o cough with green sputum x 5 days, unable to sleep d/t coughing. Has hot flashes and stomach hurts with coughing, sorethroat.

## 2015-05-25 ENCOUNTER — Encounter (HOSPITAL_BASED_OUTPATIENT_CLINIC_OR_DEPARTMENT_OTHER): Payer: Self-pay | Admitting: *Deleted

## 2015-05-25 ENCOUNTER — Emergency Department (HOSPITAL_BASED_OUTPATIENT_CLINIC_OR_DEPARTMENT_OTHER)
Admission: EM | Admit: 2015-05-25 | Discharge: 2015-05-25 | Disposition: A | Discharge status: Home / Self Care | Payer: Self-pay | Attending: Emergency Medicine | Admitting: Emergency Medicine

## 2015-05-25 DIAGNOSIS — H9202 Otalgia, left ear: Secondary | ICD-10-CM | POA: Insufficient documentation

## 2015-05-25 DIAGNOSIS — Z87891 Personal history of nicotine dependence: Secondary | ICD-10-CM | POA: Insufficient documentation

## 2015-05-25 DIAGNOSIS — J40 Bronchitis, not specified as acute or chronic: Secondary | ICD-10-CM | POA: Insufficient documentation

## 2015-05-25 DIAGNOSIS — Z79899 Other long term (current) drug therapy: Secondary | ICD-10-CM | POA: Insufficient documentation

## 2015-05-25 DIAGNOSIS — I10 Essential (primary) hypertension: Secondary | ICD-10-CM | POA: Insufficient documentation

## 2015-05-25 DIAGNOSIS — Z880 Allergy status to penicillin: Secondary | ICD-10-CM | POA: Insufficient documentation

## 2015-05-25 NOTE — ED Provider Notes (Signed)
CSN: [id number]     Arrival date & time 05/25/15  [id number] History   First MD Initiated Contact with Patient 05/25/15 0741     No chief complaint on file.    (Consider location/radiation/quality/duration/timing/severity/associated sxs/prior Treatment) HPI  Pt presenting with c/o left ear pain as well as central chest pain.  Worse with coughing.  He was seen for a bronchitis 1 week ago in the ED- he has been using the inhaler provided, but did not get the steroids filled.  He states the pain in his left ear feels like his ear is clogged and radiates down his left neck.  No fever/chills.  No difficulty breathing.  No leg swelling.  Chest pain is described as a soreness in the center of his chest and worse with coughing.  There are no other associated systemic symptoms, there are no other alleviating or modifying factors.   Past Medical History  Diagnosis Date  . Hypertension    Past Surgical History  Procedure Laterality Date  . Arm surgery      torn bicept   Family History  Problem Relation Age of Onset  . Hypertension Mother   . Hypertension Father   . Cancer Other    Social History  Substance Use Topics  . Smoking status: Former Smoker -- 0.00 packs/day    Quit date: 05/11/2015  . Smokeless tobacco: None  . Alcohol Use: 0.0 oz/week    0 Standard drinks or equivalent per week     Comment: occ    Review of Systems  ROS reviewed and all otherwise negative except for mentioned in HPI    Allergies  Penicillins and Sulfa antibiotics  Home Medications   Prior to Admission medications   Medication Sig Start Date End Date Taking? Authorizing Provider  hydrochlorothiazide (HYDRODIURIL) 25 MG tablet Take 0.5 tablets (12.5 mg total) by mouth daily. 05/18/15  Yes Carmin Muskrat, MD   BP 131/76 mmHg  Pulse 64  Temp(Src) 97.7 F (36.5 C) (Oral)  Resp 18  Ht 5\' 9"  (1.753 m)  Wt 86.183 kg  BMI 28.05 kg/m2  SpO2 100%  Vitals reviewed Physical Exam  Physical Examination:  General appearance - alert, well appearing, and in no distress Mental status - alert, oriented to person, place, and time Eyes - no conjunctival injection, no scleral icterus Ears- bilateral TMs clear, normal EACs bilaterally Mouth - mucous membranes moist, pharynx normal without lesions Neck - supple, no significant adenopathy Chest - clear to auscultation, no wheezes, rales or rhonchi, symmetric air entry Heart - normal rate, regular rhythm, normal S1, S2, no murmurs, rubs, clicks or gallops Neurological - alert, oriented, normal speech Extremities - peripheral pulses normal, no pedal edema, no clubbing or cyanosis Skin - normal coloration and turgor, no rashes  ED Course  Procedures (including critical care time) Labs Review Labs Reviewed - No data to display  Imaging Review No results found. I have personally reviewed and evaluated these images and lab results as part of my medical decision-making.   EKG Interpretation   Date/Time:  Wednesday May 25 2015 07:50:33 EST Ventricular Rate:  64 PR Interval:  169 QRS Duration: 100 QT Interval:  412 QTC Calculation: 425 R Axis:   67 Text Interpretation:  Sinus rhythm Borderline T wave abnormalities No  significant change since last tracing Confirmed by [facility]  MD, Tomaz Janis  [phone]) on 05/25/2015 8:05:02 AM      MDM   Final diagnoses:  Bronchitis  Ear pain, left  Pt presenting with c/o cough and ear pain.  Pt has albuterol from prior visit.  No wheezing today, but encouraged to fill prednisone from prior visit.  No evidence of OM.   Patient is overall nontoxic and well hydrated in appearance.  He is not hypoxic or tachypneic to suggest pneumonia.  Continue symptomatic treatment.  Discharged with strict return precautions.  Pt agreeable with plan.     Alfonzo Beers, MD 05/26/15 [phone]

## 2015-05-25 NOTE — ED Notes (Signed)
C/o left ear pain to neck x 4 days and mid sternum c/p. States left ear feels "clogged up". Denies other sx. States he has had stomach virus and coughing last week.

## 2015-05-25 NOTE — Discharge Instructions (Signed)
Return to the ED with any concerns including difficulty breathing despite using albuterol every 4 hours, not drinking fluids, decreased urine output, vomiting and not able to keep down liquids or medications, decreased level of alertness/lethargy, or any other alarming symptoms °

## 2015-07-08 ENCOUNTER — Emergency Department (HOSPITAL_BASED_OUTPATIENT_CLINIC_OR_DEPARTMENT_OTHER)
Admission: EM | Admit: 2015-07-08 | Discharge: 2015-07-08 | Disposition: A | Discharge status: Home / Self Care | Payer: Self-pay | Attending: Physician Assistant | Admitting: Physician Assistant

## 2015-07-08 ENCOUNTER — Emergency Department (HOSPITAL_BASED_OUTPATIENT_CLINIC_OR_DEPARTMENT_OTHER): Payer: Self-pay

## 2015-07-08 ENCOUNTER — Encounter (HOSPITAL_BASED_OUTPATIENT_CLINIC_OR_DEPARTMENT_OTHER): Payer: Self-pay | Admitting: *Deleted

## 2015-07-08 DIAGNOSIS — Z87891 Personal history of nicotine dependence: Secondary | ICD-10-CM | POA: Insufficient documentation

## 2015-07-08 DIAGNOSIS — Z880 Allergy status to penicillin: Secondary | ICD-10-CM | POA: Insufficient documentation

## 2015-07-08 DIAGNOSIS — Z79899 Other long term (current) drug therapy: Secondary | ICD-10-CM | POA: Insufficient documentation

## 2015-07-08 DIAGNOSIS — R05 Cough: Secondary | ICD-10-CM | POA: Insufficient documentation

## 2015-07-08 DIAGNOSIS — I10 Essential (primary) hypertension: Secondary | ICD-10-CM | POA: Insufficient documentation

## 2015-07-08 DIAGNOSIS — R059 Cough, unspecified: Secondary | ICD-10-CM

## 2015-07-08 MED ORDER — BENZONATATE 100 MG PO CAPS
100.0000 mg | ORAL_CAPSULE | Freq: Three times a day (TID) | ORAL | Status: DC | PRN
Start: 2015-07-08 — End: 2015-07-24

## 2015-07-08 MED ORDER — GUAIFENESIN 100 MG/5ML PO SOLN
5.0000 mL | Freq: Once | ORAL | Status: AC
  Administered 2015-07-08: 100 mg via ORAL
  Filled 2015-07-08: qty 5

## 2015-07-08 MED ORDER — GUAIFENESIN 100 MG/5ML PO LIQD: 100.0000 mg | ORAL | Status: DC | PRN

## 2015-07-08 MED FILL — BENZONATATE 100 MG CAPSULE: 100 | 6 days supply | Qty: 20 | Fill #0

## 2015-07-08 MED FILL — Q-TUSSIN 100 MG/5 ML SOLN: 100 | 4 days supply | Qty: 240 | Fill #0

## 2015-07-08 NOTE — Discharge Instructions (Signed)

## 2015-07-08 NOTE — ED Notes (Signed)
C/o congestion. Green sputum with cough. Onset 2 days ago. No fever. Fatigued. No other sx

## 2015-07-08 NOTE — ED Provider Notes (Signed)
CSN: [id number]     Arrival date & time 07/08/15  1002 History   First MD Initiated Contact with Patient 07/08/15 1012     Chief Complaint  Patient presents with  . Cough     (Consider location/radiation/quality/duration/timing/severity/associated sxs/prior Treatment) HPI   Patient's 45 year old male presenting with cough. Patient here because his wife is here with morning sickness. He reports he's had a cough for the last day and a half. He reports positive sputum. He states that prior smoker. He said no shortness of breath. He states the cough keeps him up at night occasionally. Patient had no sinus pain. No fevers. No nausea vomiting diarrhea.  Past Medical History  Diagnosis Date  . Hypertension    Past Surgical History  Procedure Laterality Date  . Arm surgery      torn bicept   Family History  Problem Relation Age of Onset  . Hypertension Mother   . Hypertension Father   . Cancer Other    Social History  Substance Use Topics  . Smoking status: Former Smoker -- 0.00 packs/day    Quit date: 05/11/2015  . Smokeless tobacco: None  . Alcohol Use: 0.0 oz/week    0 Standard drinks or equivalent per week     Comment: occ    Review of Systems  Constitutional: Negative for activity change.  HENT: Negative for congestion and ear pain.   Respiratory: Positive for cough. Negative for shortness of breath.   Cardiovascular: Negative for chest pain.  Gastrointestinal: Negative for abdominal pain.  Neurological: Negative for headaches.  Psychiatric/Behavioral: Negative for agitation.      Allergies  Penicillins and Sulfa antibiotics  Home Medications   Prior to Admission medications   Medication Sig Start Date End Date Taking? Authorizing Provider  hydrochlorothiazide (HYDRODIURIL) 25 MG tablet Take 0.5 tablets (12.5 mg total) by mouth daily. 05/18/15  Yes Carmin Muskrat, MD   BP 145/93 mmHg  Pulse 70  Temp(Src) 97.9 F (36.6 C) (Oral)  Resp 18  Ht 5\' 5"  (1.651  m)  Wt 190 lb (86.183 kg)  BMI 31.62 kg/m2  SpO2 97% Physical Exam  Constitutional: He is oriented to person, place, and time. He appears well-nourished.  HENT:  Head: Normocephalic and atraumatic.  Right Ear: External ear normal.  Left Ear: External ear normal.  Nose: Nose normal.  Mouth/Throat: No oropharyngeal exudate.  Eyes: Conjunctivae and EOM are normal. Pupils are equal, round, and reactive to light. Right eye exhibits no discharge. Left eye exhibits no discharge.  Cardiovascular: Normal rate.   Pulmonary/Chest: Effort normal. No respiratory distress. He has no wheezes. He has no rales.  Neurological: He is oriented to person, place, and time.  Skin: Skin is warm and dry. He is not diaphoretic.  Psychiatric: He has a normal mood and affect. His behavior is normal.    ED Course  Procedures (including critical care time) Labs Review Labs Reviewed - No data to display  Imaging Review No results found. I have personally reviewed and evaluated these images and lab results as part of my medical decision-making.   EKG Interpretation None      MDM   Final diagnoses:  None    Patient is a pleasant 45 year old male presenting with cough for 1 day. Patient had no fever, nausea vomiting diarrhea. Patient able to take adequate by mouth. Patient reports mild sputum production. We will get chest x-ray to ensure patient does not have pneumonia. Otherwise we'll treat as viral infection. We'll give symptomatic  care for patient to use at home.  CXR neg. Will give home meds.   Chaniyah Jahr Julio Alm, MD 07/08/15 1535

## 2015-07-24 ENCOUNTER — Encounter (HOSPITAL_BASED_OUTPATIENT_CLINIC_OR_DEPARTMENT_OTHER): Payer: Self-pay | Admitting: *Deleted

## 2015-07-24 DIAGNOSIS — Z87891 Personal history of nicotine dependence: Secondary | ICD-10-CM | POA: Insufficient documentation

## 2015-07-24 DIAGNOSIS — Z880 Allergy status to penicillin: Secondary | ICD-10-CM | POA: Insufficient documentation

## 2015-07-24 DIAGNOSIS — I10 Essential (primary) hypertension: Secondary | ICD-10-CM | POA: Insufficient documentation

## 2015-07-24 DIAGNOSIS — Z79899 Other long term (current) drug therapy: Secondary | ICD-10-CM | POA: Insufficient documentation

## 2015-07-24 NOTE — ED Notes (Signed)
HTN-without medication x 2 weeks.  Reports increased stress, HA.

## 2015-07-25 ENCOUNTER — Emergency Department (HOSPITAL_BASED_OUTPATIENT_CLINIC_OR_DEPARTMENT_OTHER)
Admission: EM | Admit: 2015-07-25 | Discharge: 2015-07-25 | Disposition: A | Discharge status: Home / Self Care | Payer: Self-pay | Attending: Emergency Medicine | Admitting: Emergency Medicine

## 2015-07-25 DIAGNOSIS — I10 Essential (primary) hypertension: Secondary | ICD-10-CM

## 2015-07-25 MED ORDER — HYDROCHLOROTHIAZIDE 25 MG PO TABS
25.0000 mg | ORAL_TABLET | Freq: Once | ORAL | Status: AC
  Administered 2015-07-25: 25 mg via ORAL
  Filled 2015-07-25: qty 1

## 2015-07-25 MED ORDER — HYDROCHLOROTHIAZIDE 25 MG PO TABS: 12.5000 mg | ORAL_TABLET | Freq: Every day | ORAL | Status: DC

## 2015-07-25 NOTE — Discharge Instructions (Signed)
Hypertension Hypertension, commonly called high blood pressure, is when the force of blood pumping through your arteries is too strong. Your arteries are the blood vessels that carry blood from your heart throughout your body. A blood pressure reading consists of a higher number over a lower number, such as 110/72. The higher number (systolic) is the pressure inside your arteries when your heart pumps. The lower number (diastolic) is the pressure inside your arteries when your heart relaxes. Ideally you want your blood pressure below 120/80. Hypertension forces your heart to work harder to pump blood. Your arteries may become narrow or stiff. Having untreated or uncontrolled hypertension can cause heart attack, stroke, kidney disease, and other problems. RISK FACTORS Some risk factors for high blood pressure are controllable. Others are not.  Risk factors you cannot control include:   Race. You may be at higher risk if you are African American.  Age. Risk increases with age.  Gender. Men are at higher risk than women before age 45 years. After age 65, women are at higher risk than men. Risk factors you can control include:  Not getting enough exercise or physical activity.  Being overweight.  Getting too much fat, sugar, calories, or salt in your diet.  Drinking too much alcohol. SIGNS AND SYMPTOMS Hypertension does not usually cause signs or symptoms. Extremely high blood pressure (hypertensive crisis) may cause headache, anxiety, shortness of breath, and nosebleed. DIAGNOSIS To check if you have hypertension, your health care provider will measure your blood pressure while you are seated, with your arm held at the level of your heart. It should be measured at least twice using the same arm. Certain conditions can cause a difference in blood pressure between your right and left arms. A blood pressure reading that is higher than normal on one occasion does not mean that you need treatment. If  it is not clear whether you have high blood pressure, you may be asked to return on a different day to have your blood pressure checked again. Or, you may be asked to monitor your blood pressure at home for 1 or more weeks. TREATMENT Treating high blood pressure includes making lifestyle changes and possibly taking medicine. Living a healthy lifestyle can help lower high blood pressure. You may need to change some of your habits. Lifestyle changes may include:  Following the DASH diet. This diet is high in fruits, vegetables, and whole grains. It is low in salt, red meat, and added sugars.  Keep your sodium intake below 2,300 mg per day.  Getting at least 30-45 minutes of aerobic exercise at least 4 times per week.  Losing weight if necessary.  Not smoking.  Limiting alcoholic beverages.  Learning ways to reduce stress. Your health care provider may prescribe medicine if lifestyle changes are not enough to get your blood pressure under control, and if one of the following is true:  You are 18-59 years of age and your systolic blood pressure is above 140.  You are 60 years of age or older, and your systolic blood pressure is above 150.  Your diastolic blood pressure is above 90.  You have diabetes, and your systolic blood pressure is over 140 or your diastolic blood pressure is over 90.  You have kidney disease and your blood pressure is above 140/90.  You have heart disease and your blood pressure is above 140/90. Your personal target blood pressure may vary depending on your medical conditions, your age, and other factors. HOME CARE INSTRUCTIONS    Have your blood pressure rechecked as directed by your health care provider.   Take medicines only as directed by your health care provider. Follow the directions carefully. Blood pressure medicines must be taken as prescribed. The medicine does not work as well when you skip doses. Skipping doses also puts you at risk for  problems.  Do not smoke.   Monitor your blood pressure at home as directed by your health care provider. SEEK MEDICAL CARE IF:   You think you are having a reaction to medicines taken.  You have recurrent headaches or feel dizzy.  You have swelling in your ankles.  You have trouble with your vision. SEEK IMMEDIATE MEDICAL CARE IF:  You develop a severe headache or confusion.  You have unusual weakness, numbness, or feel faint.  You have severe chest or abdominal pain.  You vomit repeatedly.  You have trouble breathing. MAKE SURE YOU:   Understand these instructions.  Will watch your condition.  Will get help right away if you are not doing well or get worse.   This information is not intended to replace advice given to you by your health care provider. Make sure you discuss any questions you have with your health care provider.   Document Released: 05/14/2005 Document Revised: 09/28/2014 Document Reviewed: 03/06/2013 Elsevier Interactive Patient Education 2016 Elsevier Inc.  

## 2015-07-25 NOTE — ED Notes (Signed)
Reports h/o HTN, (denies CP or sob, also denies nvd, fever, dizziness, cough or sore throat). Admits to some blurred vision, general weakness, "feel beat down", mild HA and tired. Has run out of BP med.

## 2015-07-25 NOTE — ED Notes (Signed)
EDP at [facility]. Pt seen by Dr. Florina Ou prior to RN assessment, see MD notes, pending orders.

## 2015-07-25 NOTE — ED Provider Notes (Signed)
CSN: [id number]     Arrival date & time 07/24/15  2319 History   First MD Initiated Contact with Patient 07/25/15 0125     Chief Complaint  Patient presents with  . Hypertension     (Consider location/radiation/quality/duration/timing/severity/associated sxs/prior Treatment) HPI  This is a 45 year old male with a history of hypertension. He has been out of his hydrochlorothiazide for 2 weeks. Since then he has been having intermittent dull headaches in the back of his head of moderate severity. They have sometimes responded to over-the-counter analgesics. He reports being under increased stress and feeling more fatigued than usual. He is not having chest pain or shortness of breath. He has had no focal neurologic deficits.  Past Medical History  Diagnosis Date  . Hypertension    Past Surgical History  Procedure Laterality Date  . Arm surgery      torn bicept   Family History  Problem Relation Age of Onset  . Hypertension Mother   . Hypertension Father   . Cancer Other    Social History  Substance Use Topics  . Smoking status: Former Smoker -- 0.00 packs/day    Quit date: 05/11/2015  . Smokeless tobacco: None  . Alcohol Use: 0.0 oz/week    0 Standard drinks or equivalent per week     Comment: occ    Review of Systems  All other systems reviewed and are negative.   Allergies  Penicillins and Sulfa antibiotics  Home Medications   Prior to Admission medications   Medication Sig Start Date End Date Taking? Authorizing Provider  hydrochlorothiazide (HYDRODIURIL) 25 MG tablet Take 0.5 tablets (12.5 mg total) by mouth daily. 05/18/15   Carmin Muskrat, MD   BP 142/104 mmHg  Temp(Src) 98.1 F (36.7 C) (Oral)  Resp 18  Ht 5\' 9"  (1.753 m)  Wt 190 lb (86.183 kg)  BMI 28.05 kg/m2  SpO2 98%   Physical Exam  General: Well-developed, well-nourished male in no acute distress; appearance consistent with age of record HENT: normocephalic; atraumatic; pharynx normal Eyes:  pupils equal, round and reactive to light; extraocular muscles intact Neck: supple Heart: regular rate and rhythm Lungs: clear to auscultation bilaterally Abdomen: soft; nondistended; nontender; no masses or hepatosplenomegaly; bowel sounds present Extremities: No deformity; full range of motion; pulses normal Neurologic: Awake, alert and oriented; motor function intact in all extremities and symmetric; no facial droop Skin: Warm and dry Psychiatric: Normal mood and affect    ED Course  Procedures (including critical care time)   MDM  Patient does not wish for any diagnostic studies to be done. He only requests a refill of his hydrochlorothiazide.      Shanon Rosser, MD 07/25/15 [phone]

## 2015-07-26 ENCOUNTER — Emergency Department (HOSPITAL_BASED_OUTPATIENT_CLINIC_OR_DEPARTMENT_OTHER)
Admission: EM | Admit: 2015-07-26 | Discharge: 2015-07-26 | Disposition: A | Discharge status: Home / Self Care | Payer: Self-pay | Attending: Physician Assistant | Admitting: Physician Assistant

## 2015-07-26 ENCOUNTER — Encounter (HOSPITAL_BASED_OUTPATIENT_CLINIC_OR_DEPARTMENT_OTHER): Payer: Self-pay | Admitting: *Deleted

## 2015-07-26 DIAGNOSIS — Z87891 Personal history of nicotine dependence: Secondary | ICD-10-CM | POA: Insufficient documentation

## 2015-07-26 DIAGNOSIS — Z880 Allergy status to penicillin: Secondary | ICD-10-CM | POA: Insufficient documentation

## 2015-07-26 DIAGNOSIS — I10 Essential (primary) hypertension: Secondary | ICD-10-CM | POA: Insufficient documentation

## 2015-07-26 DIAGNOSIS — Z0000 Encounter for general adult medical examination without abnormal findings: Secondary | ICD-10-CM

## 2015-07-26 DIAGNOSIS — R5383 Other fatigue: Secondary | ICD-10-CM | POA: Insufficient documentation

## 2015-07-26 DIAGNOSIS — Z008 Encounter for other general examination: Secondary | ICD-10-CM | POA: Insufficient documentation

## 2015-07-26 LAB — MONONUCLEOSIS SCREEN: MONO SCREEN: NEGATIVE

## 2015-07-26 LAB — CBC WITH DIFFERENTIAL/PLATELET
BASOS PCT: 1 %
Basophils Absolute: 0 10*3/uL (ref 0.0–0.1)
EOS ABS: 0.1 10*3/uL (ref 0.0–0.7)
Eosinophils Relative: 3 %
HEMATOCRIT: 41.9 % (ref 39.0–52.0)
Hemoglobin: 14.7 g/dL (ref 13.0–17.0)
LYMPHS ABS: 1.9 10*3/uL (ref 0.7–4.0)
Lymphocytes Relative: 46 %
MCH: 30.2 pg (ref 26.0–34.0)
MCHC: 35.1 g/dL (ref 30.0–36.0)
MCV: 86 fL (ref 78.0–100.0)
MONO ABS: 0.3 10*3/uL (ref 0.1–1.0)
MONOS PCT: 8 %
Neutro Abs: 1.7 10*3/uL (ref 1.7–7.7)
Neutrophils Relative %: 42 %
Platelets: 281 10*3/uL (ref 150–400)
RBC: 4.87 MIL/uL (ref 4.22–5.81)
RDW: 12.5 % (ref 11.5–15.5)
WBC: 4 10*3/uL (ref 4.0–10.5)

## 2015-07-26 LAB — COMPREHENSIVE METABOLIC PANEL
ALBUMIN: 4.6 g/dL (ref 3.5–5.0)
ALK PHOS: 109 U/L (ref 38–126)
ALT: 26 U/L (ref 17–63)
ANION GAP: 10 (ref 5–15)
AST: 23 U/L (ref 15–41)
BILIRUBIN TOTAL: 0.5 mg/dL (ref 0.3–1.2)
BUN: 16 mg/dL (ref 6–20)
CALCIUM: 9 mg/dL (ref 8.9–10.3)
CO2: 24 mmol/L (ref 22–32)
Chloride: 103 mmol/L (ref 101–111)
Creatinine, Ser: 1.16 mg/dL (ref 0.61–1.24)
GFR calc non Af Amer: >60 mL/min (ref >60)
GLUCOSE: 99 mg/dL (ref 65–99)
POTASSIUM: 3.8 mmol/L (ref 3.5–5.1)
Sodium: 137 mmol/L (ref 135–145)
TOTAL PROTEIN: 7.4 g/dL (ref 6.5–8.1)

## 2015-07-26 NOTE — Discharge Instructions (Signed)
You have normal labs, please return with concerns.     Emergency Department Resource Guide 1) Find a Doctor and Pay Out of Pocket Although you won't have to find out who is covered by your insurance plan, it is a good idea to ask around and get recommendations. You will then need to call the office and see if the doctor you have chosen will accept you as a new patient and what types of options they offer for patients who are self-pay. Some doctors offer discounts or will set up payment plans for their patients who do not have insurance, but you will need to ask so you aren't surprised when you get to your appointment.  2) Contact Your Local Health Department Not all health departments have doctors that can see patients for sick visits, but many do, so it is worth a call to see if yours does. If you don't know where your local health department is, you can check in your phone book. The CDC also has a tool to help you locate your state's health department, and many state websites also have listings of all of their local health departments.  3) Find a [facility] If your illness is not likely to be very severe or complicated, you may want to try a walk in clinic. These are popping up all over the country in pharmacies, drugstores, and shopping centers. They're usually staffed by nurse practitioners or physician assistants that have been trained to treat common illnesses and complaints. They're usually fairly quick and inexpensive. However, if you have serious medical issues or chronic medical problems, these are probably not your best option.  No Primary Care Doctor: - Call Health Connect at  [phone] - they can help you locate a primary care doctor that  accepts your insurance, provides certain services, etc. - Physician Referral Service- [phone]  Chronic Pain Problems: Organization         Address  Phone   Notes  [facility]  [phone] Patients need to be  referred by their primary care doctor.   Medication Assistance: Organization         Address  Phone   Notes  [facility] Medication [facility] [address] [place], [place] [address] [phone] --Must be a resident of [facility] -- Must have NO insurance coverage whatsoever (no Medicaid/ Medicare, etc.) -- The pt. MUST have a primary care doctor that directs their care regularly and follows them in the community   MedAssist  [phone]   [employer]  [phone]    Agencies that provide inexpensive medical care: Organization         Address  Phone   Notes  [place]  [phone]   [facility]    [phone]   [facility] [place], [place] [address] [phone]   [place] [address] [place] [phone]   Planned Parenthood    [phone]   [facility]    [phone]   [place] and [place] [address], [place] Phone:  [phone], Fax:  [phone] Hours of Operation:  9 am - 6 pm, M-F.  Also accepts Medicaid/Medicare and self-pay.  [facility] for [place], [address], [place] Phone: [phone], Fax: [phone]. Hours of Operation:  8:30 am - 5:30 pm,  M-F.  Also accepts Medicaid and self-pay.  [facility] [place] [address] [place] Phone: [phone]   [place], [place], [place] [phone], Ext. [phone] Mondays & Thursdays: 7-9 AM.  First 15 patients are seen on a first come, first serve basis.    [place] Providers:  Organization         Address  Phone   Notes  [facility] [address],  [phone] Also accepts self-pay patients.  [facility] [id number] [place], [place]  [phone]   [place], [address], [place] [phone]   [facility] [address], [place] [phone]   Lucianne Lei [address], [place]   [phone] Only accepts [place] Access [place] patients after they have their name applied to their card.   Self-Pay (no insurance) in [facility]:  Organization         Address  Phone   Notes  Sickle Cell Patients, [facility] [place] [phone]   [facility] [place] [phone]   [facility] [place]  [place], [place], [place] [phone]   [facility]/Dr. Osei-Bonsu  [address] [place] or [address], [place] [phone] Phone number for both [place] and [place] locations is the same.  [facility] [address], [place] [phone]   [facility] [address], [place] or [address] [phone]   [facility] [address] [place] [phone], phone; [phone], fax Sees patients 1st and 3rd Saturday of every month.  Must not qualify for public or private insurance (i.e. Medicaid, Medicare, [place] Health Choice, Veterans' Benefits)  Household income should be no more than 200% of the poverty level The clinic cannot treat you if you are pregnant or think you are pregnant  Sexually transmitted diseases are not treated at the clinic.    Dental Care: Organization         Address  Phone  Notes  [facility] Department of [facility] [place] [phone] Accepts children up to age 46 who are enrolled in [place] or [place]; pregnant women with a Medicaid card; and children who have applied for Medicaid or [place] Health Choice, but were declined, whose parents can  pay a reduced fee at time of service.  [facility] Department of [facility]  [address], [place] [phone] Accepts children up to age 4 who are enrolled in [place] or [place]; pregnant women with a Medicaid card; and children who have applied for Medicaid or [place] Health Choice, but were declined, whose parents can pay a reduced fee at time of service.  [place] Adult Dental Access PROGRAM  [place] [phone] Patients are seen by appointment only. Walk-ins are not accepted. [place] will see patients 87 years of age and older. Monday - Tuesday (8am-5pm) Most Wednesdays (8:30-5pm) $30 per visit, cash only  [facility] Adult Dental Access PROGRAM  [address], [facility] [phone] Patients are seen by appointment only. Walk-ins are not accepted.  [place] will see patients 30 years of age and older. One Wednesday Evening (Monthly: Volunteer Based).  $30 per visit, cash only  [place]  [phone] for adults; Children under age 68, call Graduate Pediatric Dentistry at [phone]. Children aged 3-14, please call [phone] to request a pediatric application.  Dental services are provided in all areas of dental care including fillings, crowns and bridges, complete and partial dentures, implants, gum treatment, root canals, and extractions. Preventive care is also provided. Treatment is provided to both adults and children. Patients are selected via a lottery and there is often a waiting list.   [facility] [address] [place]  [phone] [email or web address]   [facility] [address] [place], [place] [phone], Ext. [phone] Second and Fourth Thursday of each month, opens at 6:30 AM; Clinic ends at 9 AM.  Patients are seen on a first-come first-served basis, and a limited number are seen during each clinic.   [facility]  [address] [place], [place] [phone]   Eligibility Requirements You must have lived in [place], [place], or [place] counties for at least the last three months.   You cannot be eligible for state or federal sponsored [employer], including [employer], [place], or [employer].   You generally cannot be eligible for healthcare insurance through your employer.    How to apply: Eligibility screenings are held every Tuesday and Wednesday afternoon from 1:00 pm until 4:00 pm. You do not need an appointment for the interview!  [facility] [address] [place], [place]   [place]  [place] Department  [place]  [phone]    Behavioral Health Resources in the Community: Intensive Outpatient Programs Organization         Address  Phone  Notes  [place]an McCloud. [address], [place], [place] [phone]   [facility]patient [address] [place], [place]   ADS: Alcohol & Drug Svcs [address] [place], [place]   [place] [address]  [place], [place] or [phone]   Substance Abuse Resources Organization         Address  Phone  Notes  Alcohol and Drug Services  [phone]   [place]  [phone]   [facility] [place]   Chinita Pester  [phone]   Residential & Outpatient Substance Abuse Program  [phone]   Psychological Services Organization         Address  Phone  Notes  [facility] [place]  [place]  [phone]   [place] [address] [place] or [phone]    Mobile Crisis Teams Organization         Address  Phone  Notes  [facility], Mobile Crisis Care Unit  [phone]    [facility] Services  [address] [place], [place]   [place] Levels [address] [place] [phone]    Self-Help/Support Groups Organization         Address  Phone             Notes  [place]. of [place] - variety of support groups  [place] Call for more information  Narcotics Anonymous (NA), Caring Services  [place], [place]  2 meetings at this location   Residential Facilities manager         Address  Phone  Notes  [facility] [place],    [place]  [phone]   [facility]  [address] [place] [id number], [place], [place]   [place], [place] [phone] Admissions: 8am-3pm M-F  Incentives Substance [place] [address]    [place], [place] [phone]   [facility] [address] [place], [place], [place]   [facility] [address],  [place], [place]   [facility] Programs - Intensive Outpatient [address] Kristeen [address], [place], [place]   [facility] ([place] [address].,  [place], [place] [phone] or [phone]   [facility] Services (RTS) [address] [place], [place] Accepts Medicaid  [facility] [place] [address]  [place] 1-7197919303 Substance Abuse/Addiction Treatment   [facility] Organization         Address  Phone  Notes  [facility]  [phone]   Domenic Schwab, PhD [address] [place], [place]   [phone] or [phone]   [facility] [place], [place] [phone]   [facility] [address] [place], [place] [phone] Insurance/Medicaid/sponsorship through [facility] and Families [address] [place]                                     [place], [place] [phone] [place] [address], [place] [phone]    Dr. Adele Schilder  [phone]   [facility] Dept. 1) [address] [place] 2) [place] 3)  [place] [address], [place] [phone]  [phone]   [place] [phone] or [phone] (After Hours)

## 2015-07-26 NOTE — ED Notes (Signed)
Pt reports having unprotected sex one month ago and has felt "tired" since then, requests std testing. Also reports left ankle injury one year ago, cont with pain.

## 2015-07-26 NOTE — ED Notes (Signed)
Patient preparing for discharge. 

## 2015-07-26 NOTE — ED Provider Notes (Signed)
CSN: [id number]     Arrival date & time 07/26/15  1018 History   First MD Initiated Contact with Patient 07/26/15 1041     Chief Complaint  Patient presents with  . Ankle Pain     (Consider location/radiation/quality/duration/timing/severity/associated sxs/prior Treatment) HPI   Patient is a healthy 45 year old male with history of hypertension presenting today with feelings of fatigue. Patient reports he's feeling more fatigued than usual for the last several weeks. He reports he doesn't want to go to the gym as much as usual. He is concerned that he might have something wrong and would like blood work done today.  No fevers. No sore throat. No cough. No Nausea vomiting. No easy bruising.   Patient has no strong family history of any kind of malignancy at a young age.     Past Medical History  Diagnosis Date  . Hypertension    Past Surgical History  Procedure Laterality Date  . Arm surgery      torn bicept   Family History  Problem Relation Age of Onset  . Hypertension Mother   . Hypertension Father   . Cancer Other    Social History  Substance Use Topics  . Smoking status: Former Smoker -- 0.00 packs/day    Quit date: 05/11/2015  . Smokeless tobacco: None  . Alcohol Use: 0.0 oz/week    0 Standard drinks or equivalent per week     Comment: occ    Review of Systems  Constitutional: Positive for fatigue. Negative for fever and activity change.  HENT: Negative for congestion, sneezing and sore throat.   Eyes: Negative for discharge and redness.  Respiratory: Negative for cough and shortness of breath.   Cardiovascular: Negative for chest pain.  Gastrointestinal: Negative for abdominal pain.  Genitourinary: Negative for dysuria and urgency.  Musculoskeletal: Negative for arthralgias.  Allergic/Immunologic: Negative for immunocompromised state.  Neurological: Negative for seizures and speech difficulty.  Psychiatric/Behavioral: Negative for behavioral problems,  confusion, decreased concentration and agitation.  All other systems reviewed and are negative.     Allergies  Penicillins and Sulfa antibiotics  Home Medications   Prior to Admission medications   Medication Sig Start Date End Date Taking? Authorizing Provider  hydrochlorothiazide (HYDRODIURIL) 25 MG tablet Take 0.5 tablets (12.5 mg total) by mouth daily. 07/25/15   John Molpus, MD   BP 130/85 mmHg  Pulse 74  Temp(Src) 98.4 F (36.9 C) (Oral)  Resp 16  Ht 5\' 9"  (1.753 m)  Wt 190 lb (86.183 kg)  BMI 28.05 kg/m2  SpO2 100% Physical Exam  Constitutional: He is oriented to person, place, and time. He appears well-nourished.  HENT:  Head: Normocephalic.  Mouth/Throat: Oropharynx is clear and moist.  Eyes: Conjunctivae are normal.  Neck: No tracheal deviation present.  Cardiovascular: Normal rate.   Pulmonary/Chest: Effort normal. No stridor. No respiratory distress.  Abdominal: Soft. There is no tenderness. There is no guarding.  Musculoskeletal: Normal range of motion. He exhibits no edema.  Neurological: He is oriented to person, place, and time. No cranial nerve deficit.  Skin: Skin is warm and dry. No rash noted. He is not diaphoretic.  Psychiatric: He has a normal mood and affect. His behavior is normal.  Nursing note and vitals reviewed.   ED Course  Procedures (including critical care time) Labs Review Labs Reviewed  CBC WITH DIFFERENTIAL/PLATELET  COMPREHENSIVE METABOLIC PANEL  MONONUCLEOSIS SCREEN    Imaging Review No results found. I have personally reviewed and evaluated these images and  lab results as part of my medical decision-making.   EKG Interpretation None      MDM   Final diagnoses:  Normal physical exam    Patient is a 45 year old male presenting with fatigue. Patient has no alarming red flags. Patient has no primary care physician. We will run baseline labs today. Anticipate them being normal given the patient is a healthy 45 year old  male with a normal vital signs normal physical exam.  We'll give reassurance and resources for follow-up with PCP.  12:22 PM Nomral labs, physical exam and vitals.   Cathaleen Korol Julio Alm, MD 07/26/15 1222

## 2015-07-26 NOTE — ED Notes (Signed)
Reports fatigue and low energy x 2 weeks

## 2015-08-09 ENCOUNTER — Emergency Department (HOSPITAL_BASED_OUTPATIENT_CLINIC_OR_DEPARTMENT_OTHER)
Admission: EM | Admit: 2015-08-09 | Discharge: 2015-08-09 | Disposition: A | Discharge status: Home / Self Care | Payer: Self-pay | Attending: Emergency Medicine | Admitting: Emergency Medicine

## 2015-08-09 ENCOUNTER — Encounter (HOSPITAL_BASED_OUTPATIENT_CLINIC_OR_DEPARTMENT_OTHER): Payer: Self-pay | Admitting: *Deleted

## 2015-08-09 ENCOUNTER — Emergency Department (HOSPITAL_COMMUNITY)
Admission: EM | Admit: 2015-08-09 | Discharge: 2015-08-09 | Disposition: A | Discharge status: Home / Self Care | Payer: Self-pay | Attending: Emergency Medicine | Admitting: Emergency Medicine

## 2015-08-09 ENCOUNTER — Encounter (HOSPITAL_COMMUNITY): Payer: Self-pay

## 2015-08-09 DIAGNOSIS — Z79899 Other long term (current) drug therapy: Secondary | ICD-10-CM | POA: Insufficient documentation

## 2015-08-09 DIAGNOSIS — J020 Streptococcal pharyngitis: Secondary | ICD-10-CM | POA: Insufficient documentation

## 2015-08-09 DIAGNOSIS — Z87891 Personal history of nicotine dependence: Secondary | ICD-10-CM | POA: Insufficient documentation

## 2015-08-09 DIAGNOSIS — I10 Essential (primary) hypertension: Secondary | ICD-10-CM | POA: Insufficient documentation

## 2015-08-09 DIAGNOSIS — J029 Acute pharyngitis, unspecified: Secondary | ICD-10-CM | POA: Insufficient documentation

## 2015-08-09 DIAGNOSIS — Z880 Allergy status to penicillin: Secondary | ICD-10-CM | POA: Insufficient documentation

## 2015-08-09 LAB — RAPID STREP SCREEN (MED CTR MEBANE ONLY): STREPTOCOCCUS, GROUP A SCREEN (DIRECT): POSITIVE — AB

## 2015-08-09 MED ORDER — CLINDAMYCIN HCL 150 MG PO CAPS: 300.0000 mg | ORAL_CAPSULE | Freq: Three times a day (TID) | ORAL | Status: DC

## 2015-08-09 MED FILL — CLINDAMYCIN HCL 150 MG CAP: 150 | 10 days supply | Qty: 60 | Fill #0

## 2015-08-09 NOTE — ED Notes (Signed)
Pt called to treatment area twice in ten minutes

## 2015-08-09 NOTE — ED Notes (Signed)
Pt called to treatment room-no answer 

## 2015-08-09 NOTE — ED Notes (Signed)
Patient c/o sore throat, chills , and body aches since yesterday.

## 2015-08-09 NOTE — ED Notes (Signed)
States he went to [facility] and left AMA due to long wait.

## 2015-08-09 NOTE — ED Notes (Signed)
Fever chills, sore throat, and aching all over.

## 2015-08-09 NOTE — ED Provider Notes (Signed)
CSN: [id number]     Arrival date & time 08/09/15  1051 History   First MD Initiated Contact with Patient 08/09/15 1112     Chief Complaint  Patient presents with  . Influenza     (Consider location/radiation/quality/duration/timing/severity/associated sxs/prior Treatment) Patient is a 45 y.o. male presenting with pharyngitis. The history is provided by the patient.  Sore Throat This is a new problem. The current episode started yesterday. The problem occurs constantly. The problem has been gradually worsening. Associated symptoms include chills, coughing, a sore throat and swollen glands. Pertinent negatives include no abdominal pain, fever, headaches, myalgias, nausea, neck pain, rash or vomiting. Associated symptoms comments: No difficulty swallowing. The symptoms are aggravated by swallowing. He has tried nothing for the symptoms.    Past Medical History  Diagnosis Date  . Hypertension    Past Surgical History  Procedure Laterality Date  . Arm surgery      torn bicept   Family History  Problem Relation Age of Onset  . Hypertension Mother   . Hypertension Father   . Cancer Other    Social History  Substance Use Topics  . Smoking status: Former Smoker -- 0.00 packs/day    Quit date: 05/11/2015  . Smokeless tobacco: None  . Alcohol Use: 0.0 oz/week    0 Standard drinks or equivalent per week     Comment: occ    Review of Systems  Constitutional: Positive for chills. Negative for fever.  HENT: Positive for sore throat.   Respiratory: Positive for cough.   Gastrointestinal: Negative for nausea, vomiting and abdominal pain.  Musculoskeletal: Negative for myalgias and neck pain.  Skin: Negative for rash.  Neurological: Negative for headaches.  All other systems reviewed and are negative.     Allergies  Penicillins and Sulfa antibiotics  Home Medications   Prior to Admission medications   Medication Sig Start Date End Date Taking? Authorizing Provider   hydrochlorothiazide (HYDRODIURIL) 25 MG tablet Take 0.5 tablets (12.5 mg total) by mouth daily. 07/25/15   John Molpus, MD   BP 148/89 mmHg  Pulse 93  Temp(Src) 98.5 F (36.9 C) (Oral)  Resp 16  Ht 5\' 9"  (1.753 m)  Wt 85.73 kg  BMI 27.90 kg/m2  SpO2 100% Physical Exam  Constitutional: He appears well-developed and well-nourished.  HENT:  Head: Normocephalic and atraumatic.  Right Ear: Tympanic membrane and ear canal normal.  Left Ear: Tympanic membrane and ear canal normal.  Nose: Nose normal.  Mouth/Throat: Uvula is midline. No trismus in the jaw. Oropharyngeal exudate, posterior oropharyngeal edema and posterior oropharyngeal erythema present.  Normal voice phonation No drooling, handling secretions well  Neck: Normal range of motion. Neck supple.  Cardiovascular: Normal rate, regular rhythm and normal heart sounds.   Pulmonary/Chest: Effort normal and breath sounds normal.  Lymphadenopathy:    He has no cervical adenopathy.  Neurological: He is alert.  Skin: Skin is warm and dry.  Psychiatric: He has a normal mood and affect.  Nursing note and vitals reviewed.   ED Course  Procedures (including critical care time) Labs Review Labs Reviewed  RAPID STREP SCREEN (NOT AT [facility]) - Abnormal; Notable for the following:    Streptococcus, Group A Screen (Direct) POSITIVE (*)    All other components within normal limits    Imaging Review No results found. I have personally reviewed and evaluated these images and lab results as part of my medical decision-making.   EKG Interpretation None      MDM  Final diagnoses:  None   Patient presents today with sore throat, chills, and cough.  Rapid strep is positive.  Patient treated with oral Clindamycin due to anaphylactic allergy to PCN.  No signs of PTA or Retropharyngeal Abscess.  Patient stable for discharge.  Return precautions given.    Hyman Bible, PA-C 08/09/15 1445  Veryl Speak, MD 08/09/15 [phone]

## 2015-08-09 NOTE — ED Notes (Signed)
Bed: WA29 Expected date:  Expected time:  Means of arrival:  Comments: 

## 2015-09-13 ENCOUNTER — Emergency Department (HOSPITAL_BASED_OUTPATIENT_CLINIC_OR_DEPARTMENT_OTHER)
Admission: EM | Admit: 2015-09-13 | Discharge: 2015-09-13 | Disposition: A | Discharge status: Home / Self Care | Payer: Self-pay | Attending: Emergency Medicine | Admitting: Emergency Medicine

## 2015-09-13 ENCOUNTER — Encounter (HOSPITAL_BASED_OUTPATIENT_CLINIC_OR_DEPARTMENT_OTHER): Payer: Self-pay | Admitting: *Deleted

## 2015-09-13 DIAGNOSIS — IMO0002 Reserved for concepts with insufficient information to code with codable children: Secondary | ICD-10-CM

## 2015-09-13 DIAGNOSIS — Z792 Long term (current) use of antibiotics: Secondary | ICD-10-CM | POA: Insufficient documentation

## 2015-09-13 DIAGNOSIS — Z87891 Personal history of nicotine dependence: Secondary | ICD-10-CM | POA: Insufficient documentation

## 2015-09-13 DIAGNOSIS — I10 Essential (primary) hypertension: Secondary | ICD-10-CM | POA: Insufficient documentation

## 2015-09-13 DIAGNOSIS — Z79899 Other long term (current) drug therapy: Secondary | ICD-10-CM | POA: Insufficient documentation

## 2015-09-13 DIAGNOSIS — R229 Localized swelling, mass and lump, unspecified: Secondary | ICD-10-CM

## 2015-09-13 DIAGNOSIS — Z880 Allergy status to penicillin: Secondary | ICD-10-CM | POA: Insufficient documentation

## 2015-09-13 DIAGNOSIS — N63 Unspecified lump in breast: Secondary | ICD-10-CM | POA: Insufficient documentation

## 2015-09-13 MED ORDER — HYDROCHLOROTHIAZIDE 25 MG PO TABS: 12.5000 mg | ORAL_TABLET | Freq: Every day | ORAL | Status: DC

## 2015-09-13 MED FILL — HYDROCHLOROTHIAZIDE 25 MG T: 25 | 60 days supply | Qty: 30 | Fill #0

## 2015-09-13 NOTE — ED Notes (Signed)
Patient c/o lump on left side of chest for the past month, no pain

## 2015-09-13 NOTE — Discharge Instructions (Signed)
Call the [place] [phone] to arrange an ultrasound to further evaluate the lump in your chest.

## 2015-09-13 NOTE — ED Provider Notes (Signed)
CSN: [id number]     Arrival date & time 09/13/15  [id number] History   First MD Initiated Contact with Patient 09/13/15 0825     Chief Complaint  Patient presents with  . Breast Mass   Pt is here today because he noticed a lump under his left breast 3 weeks ago.  The pt is taking some new vitamins with testosterone in it.  The pt is concerned because it is not going away.  The pt denies any pain or redness.  (Consider location/radiation/quality/duration/timing/severity/associated sxs/prior Treatment) The history is provided by the patient. No language interpreter was used.    Past Medical History  Diagnosis Date  . Hypertension    Past Surgical History  Procedure Laterality Date  . Arm surgery      torn bicept   Family History  Problem Relation Age of Onset  . Hypertension Mother   . Hypertension Father   . Cancer Other    Social History  Substance Use Topics  . Smoking status: Former Smoker -- 0.00 packs/day    Quit date: 05/11/2015  . Smokeless tobacco: None  . Alcohol Use: 0.0 oz/week    0 Standard drinks or equivalent per week     Comment: occ    Review of Systems  All other systems reviewed and are negative.     Allergies  Penicillins and Sulfa antibiotics  Home Medications   Prior to Admission medications   Medication Sig Start Date End Date Taking? Authorizing Provider  clindamycin (CLEOCIN) 150 MG capsule Take 2 capsules (300 mg total) by mouth 3 (three) times daily. 08/09/15   Heather Laisure, PA-C  hydrochlorothiazide (HYDRODIURIL) 25 MG tablet Take 0.5 tablets (12.5 mg total) by mouth daily. 07/25/15   John Molpus, MD   BP 155/92 mmHg  Pulse 62  Temp(Src) 98.6 F (37 C) (Oral)  Resp 18  Ht 5\' 9"  (1.753 m)  Wt 189 lb (85.73 kg)  BMI 27.90 kg/m2  SpO2 100% Physical Exam  Constitutional: He is oriented to person, place, and time. He appears well-developed and well-nourished.  HENT:  Head: Normocephalic and atraumatic.  Nose: Nose normal.    Mouth/Throat: Oropharynx is clear and moist.  Eyes: Conjunctivae and EOM are normal. Pupils are equal, round, and reactive to light.  Neck: Normal range of motion. Neck supple.  Cardiovascular: Normal rate, regular rhythm, normal heart sounds and intact distal pulses.   Pulmonary/Chest: Effort normal and breath sounds normal.    Abdominal: Soft. Bowel sounds are normal.  Musculoskeletal: Normal range of motion.  Neurological: He is alert and oriented to person, place, and time. He has normal reflexes.  Skin: Skin is warm and dry.  Psychiatric: He has a normal mood and affect.  Nursing note and vitals reviewed.   ED Course  Procedures (including critical care time) Labs Review Labs Reviewed - No data to display  Imaging Review No results found. I have personally reviewed and evaluated these images and lab results as part of my medical decision-making.   EKG Interpretation None      MDM  I suspect this is related to the hormones that pt has been taking, but in case it is something worse, I ordered an outpatient left breast US to more fully evaluate.   Final diagnoses:  Mass       Isla Pence, MD 09/13/15 [phone]

## 2015-10-03 ENCOUNTER — Emergency Department (HOSPITAL_BASED_OUTPATIENT_CLINIC_OR_DEPARTMENT_OTHER)
Admission: EM | Admit: 2015-10-03 | Discharge: 2015-10-03 | Disposition: A | Discharge status: Home / Self Care | Payer: Self-pay | Attending: Emergency Medicine | Admitting: Emergency Medicine

## 2015-10-03 ENCOUNTER — Encounter (HOSPITAL_BASED_OUTPATIENT_CLINIC_OR_DEPARTMENT_OTHER): Payer: Self-pay | Admitting: *Deleted

## 2015-10-03 DIAGNOSIS — Z87891 Personal history of nicotine dependence: Secondary | ICD-10-CM | POA: Insufficient documentation

## 2015-10-03 DIAGNOSIS — R197 Diarrhea, unspecified: Secondary | ICD-10-CM | POA: Insufficient documentation

## 2015-10-03 DIAGNOSIS — I10 Essential (primary) hypertension: Secondary | ICD-10-CM | POA: Insufficient documentation

## 2015-10-03 DIAGNOSIS — Z79899 Other long term (current) drug therapy: Secondary | ICD-10-CM | POA: Insufficient documentation

## 2015-10-03 MED ORDER — DICYCLOMINE HCL 20 MG PO TABS
20.0000 mg | ORAL_TABLET | Freq: Two times a day (BID) | ORAL | Status: DC
Start: 2015-10-03 — End: 2015-11-02

## 2015-10-03 NOTE — Discharge Instructions (Signed)
Probiotic for the next 2 weeks  If no better, try dicylcomine - if no better see your doctor  ER if you get worse or see blood in your stool or have severe abdominal pain

## 2015-10-03 NOTE — ED Provider Notes (Signed)
CSN: [id number]     Arrival date & time 10/03/15  0818 History   First MD Initiated Contact with Patient 10/03/15 [phone]     Chief Complaint  Patient presents with  . Diarrhea     (Consider location/radiation/quality/duration/timing/severity/associated sxs/prior Treatment) HPI Comments: The patient is a 45 year old male, he has a history of hypertension but no other significant past medical history.  He reports that after taking a colon cleanse type medication for a couple of days he started to have some very functional diarrhea which has been ongoing for the last 3 weeks. Some days he is normal, some days he has multiple loose stools, there is never blood. He denies travel, recent new medications except for a testosterone"-containing medication and denies any sick contacts or recent antibiotic use. The patient does report no abdominal pain, no penile discharge, no nausea, no fevers  Patient is a 45 y.o. male presenting with diarrhea. The history is provided by the patient.  Diarrhea   Past Medical History  Diagnosis Date  . Hypertension    Past Surgical History  Procedure Laterality Date  . Arm surgery      torn bicept   Family History  Problem Relation Age of Onset  . Hypertension Mother   . Hypertension Father   . Cancer Other    Social History  Substance Use Topics  . Smoking status: Former Smoker -- 0.00 packs/day    Quit date: 05/11/2015  . Smokeless tobacco: None  . Alcohol Use: 0.0 oz/week    0 Standard drinks or equivalent per week     Comment: occ    Review of Systems  Gastrointestinal: Positive for diarrhea.  All other systems reviewed and are negative.     Allergies  Penicillins and Sulfa antibiotics  Home Medications   Prior to Admission medications   Medication Sig Start Date End Date Taking? Authorizing Provider  hydrochlorothiazide (HYDRODIURIL) 25 MG tablet Take 0.5 tablets (12.5 mg total) by mouth daily. 09/13/15  Yes Isla Pence, MD   dicyclomine (BENTYL) 20 MG tablet Take 1 tablet (20 mg total) by mouth 2 (two) times daily. 10/03/15   Noemi Chapel, MD   BP 152/95 mmHg  Pulse 64  Temp(Src) 98 F (36.7 C) (Oral)  Resp 18  Ht 5\' 9"  (1.753 m)  Wt 190 lb (86.183 kg)  BMI 28.05 kg/m2  SpO2 100% Physical Exam  Constitutional: He appears well-developed and well-nourished. No distress.  HENT:  Head: Normocephalic and atraumatic.  Mouth/Throat: Oropharynx is clear and moist. No oropharyngeal exudate.  Eyes: Conjunctivae and EOM are normal. Pupils are equal, round, and reactive to light. Right eye exhibits no discharge. Left eye exhibits no discharge. No scleral icterus.  Neck: Normal range of motion. Neck supple. No JVD present. No thyromegaly present.  Cardiovascular: Normal rate, regular rhythm, normal heart sounds and intact distal pulses.  Exam reveals no gallop and no friction rub.   No murmur heard. Pulmonary/Chest: Effort normal and breath sounds normal. No respiratory distress. He has no wheezes. He has no rales.  Abdominal: Soft. Bowel sounds are normal. He exhibits no distension and no mass. There is no tenderness.  Musculoskeletal: Normal range of motion. He exhibits no edema or tenderness.  Lymphadenopathy:    He has no cervical adenopathy.  Neurological: He is alert. Coordination normal.  Skin: Skin is warm and dry. No rash noted. No erythema.  Psychiatric: He has a normal mood and affect. His behavior is normal.  Nursing note and vitals reviewed.  ED Course  Procedures (including critical care time) Labs Review Labs Reviewed - No data to display  Imaging Review No results found. I have personally reviewed and evaluated these images and lab results as part of my medical decision-making.    MDM   Final diagnoses:  Diarrhea, unspecified type    Normal exam, normal genitourinary exam, no urethral discharge, no blood at the meatus, normal circumcised penis. He has no abdominal discomfort or  tenderness, vital signs are unremarkable except for mild hypertension, it sounds as though he has some functional diarrhea, I have encouraged him to use a probiotic for 2 weeks then to use dicyclomine as needed if no improvement and follow up with a family doctor. He expressed his understanding and is in total agreement with the plan.  Meds given in ED:  Medications - No data to display  New Prescriptions   DICYCLOMINE (BENTYL) 20 MG TABLET    Take 1 tablet (20 mg total) by mouth 2 (two) times daily.        Noemi Chapel, MD 10/03/15 [phone]

## 2015-10-03 NOTE — ED Notes (Signed)
Pt has multiple vague complaints, states he has headache, "lump" to his chest, some lower abd "pulling", states all have been going on for years. When asked which is bothering him MOST, pt states "the lump I have on my chest and I'm worried about having unprotected sex a few months ago...".  Pt educated on importance of using condoms and risks of unprotected intercourse, pt verbalizes understanding, denies any std related c/o at this time.

## 2015-10-03 NOTE — ED Notes (Signed)
MD at bedside. 

## 2015-10-03 NOTE — ED Notes (Signed)
C/o diarrhea intermittant x 2 weeks. Decreased energy. No n/v. Also states he has ha lump over left breast he wants looked at.

## 2015-10-04 ENCOUNTER — Ambulatory Visit: Payer: Self-pay | Admitting: Family Medicine

## 2015-10-15 ENCOUNTER — Emergency Department
Admission: EM | Admit: 2015-10-15 | Discharge: 2015-10-15 | Disposition: A | Discharge status: Home / Self Care | Payer: Self-pay | Attending: Student | Admitting: Student

## 2015-10-15 ENCOUNTER — Emergency Department: Payer: Self-pay

## 2015-10-15 ENCOUNTER — Encounter: Payer: Self-pay | Admitting: Emergency Medicine

## 2015-10-15 DIAGNOSIS — R079 Chest pain, unspecified: Secondary | ICD-10-CM | POA: Insufficient documentation

## 2015-10-15 DIAGNOSIS — F17210 Nicotine dependence, cigarettes, uncomplicated: Secondary | ICD-10-CM | POA: Insufficient documentation

## 2015-10-15 DIAGNOSIS — Z5321 Procedure and treatment not carried out due to patient leaving prior to being seen by health care provider: Secondary | ICD-10-CM | POA: Insufficient documentation

## 2015-10-15 DIAGNOSIS — I10 Essential (primary) hypertension: Secondary | ICD-10-CM | POA: Insufficient documentation

## 2015-10-15 LAB — CBC
HCT: 40.6 % (ref 40.0–52.0)
Hemoglobin: 14 g/dL (ref 13.0–18.0)
MCH: 30.5 pg (ref 26.0–34.0)
MCHC: 34.5 g/dL (ref 32.0–36.0)
MCV: 88.5 fL (ref 80.0–100.0)
PLATELETS: 226 10*3/uL (ref 150–440)
RBC: 4.59 MIL/uL (ref 4.40–5.90)
RDW: 13.2 % (ref 11.5–14.5)
WBC: 5 10*3/uL (ref 3.8–10.6)

## 2015-10-15 LAB — BASIC METABOLIC PANEL
Anion gap: 9 (ref 5–15)
BUN: 20 mg/dL (ref 6–20)
CALCIUM: 9 mg/dL (ref 8.9–10.3)
CO2: 22 mmol/L (ref 22–32)
CREATININE: 1.35 mg/dL — AB (ref 0.61–1.24)
Chloride: 106 mmol/L (ref 101–111)
GFR calc Af Amer: >60 mL/min (ref >60)
GLUCOSE: 94 mg/dL (ref 65–99)
Potassium: 3.7 mmol/L (ref 3.5–5.1)
SODIUM: 137 mmol/L (ref 135–145)

## 2015-10-15 LAB — TROPONIN I: Troponin I: <0.03 ng/mL (ref <0.031)

## 2015-10-15 NOTE — ED Notes (Signed)
Pt called to speak to this nurse for his results; said he had a family emergency and needed to leave; was just wanting "some closure"; explained to pt that I was unable to give results and that he'd have to return, check back in and wait to see provider for results; pt verbalized understanding

## 2015-10-15 NOTE — ED Notes (Addendum)
Pt c/o chest tightness intermittent for the last 2 days, more constant this evening; lightheaded yesterday; pain to left chest that radiates across to right chest; had a similar episode about a year ago but did not seek medical attention; pt says he has been outside all day today in the heat, exercising vigorously and felt like his heart was beating irregularly; pt says he hasn't taken his HTN medication for several days, "trying to moderate through exercise"; BP in triage 158/98

## 2015-10-15 NOTE — ED Notes (Signed)
Pt called at this time, spoke to this RN. Pt stated he left ED to deal with family emergency and would not be returning to ED here. Pt was encouraged to seek medical.

## 2015-10-25 ENCOUNTER — Emergency Department (HOSPITAL_BASED_OUTPATIENT_CLINIC_OR_DEPARTMENT_OTHER)
Admission: EM | Admit: 2015-10-25 | Discharge: 2015-10-25 | Disposition: A | Discharge status: Home / Self Care | Payer: Self-pay | Attending: Emergency Medicine | Admitting: Emergency Medicine

## 2015-10-25 ENCOUNTER — Encounter (HOSPITAL_BASED_OUTPATIENT_CLINIC_OR_DEPARTMENT_OTHER): Payer: Self-pay

## 2015-10-25 DIAGNOSIS — I10 Essential (primary) hypertension: Secondary | ICD-10-CM | POA: Insufficient documentation

## 2015-10-25 DIAGNOSIS — M25512 Pain in left shoulder: Secondary | ICD-10-CM | POA: Insufficient documentation

## 2015-10-25 DIAGNOSIS — F17210 Nicotine dependence, cigarettes, uncomplicated: Secondary | ICD-10-CM | POA: Insufficient documentation

## 2015-10-25 NOTE — ED Provider Notes (Signed)
CSN: [id number]     Arrival date & time 10/25/15  0940 History   First MD Initiated Contact with Patient 10/25/15 1026     Chief Complaint  Patient presents with  . Back Pain  . Shoulder Pain     HPI   45 YOM presents today with complaints of shoulder pain. Patient reports the last 3 weeks she's had intermittent left shoulder and neck pain. Patient reports that symptoms are not present TIME, and not associated with particular movements. Patient notes that from time to time he has a sharp pain in his left shoulder with an altered sensation in his left deltoid. Patient denies any decreased range of motion, decreased strength, any neurological deficits. Patient notes minor tenderness to palpation of the left scapular region, denies any fever, chills, nausea, vomiting. Patient reports full active range of motion, denies any history of trauma, infectious etiology, IV drug use. Patient notes that he previously abused cocaine, no longer using that. Patient has not tried any medications prior to arrival. He reports that he is physically active lifting weights, doing numerous overhead activities, also works in [employer].    Past Medical History  Diagnosis Date  . Hypertension    Past Surgical History  Procedure Laterality Date  . Arm surgery      torn bicept   Family History  Problem Relation Age of Onset  . Hypertension Mother   . Hypertension Father   . Cancer Other    Social History  Substance Use Topics  . Smoking status: Current Some Day Smoker -- 0.00 packs/day    Types: Cigarettes    Last Attempt to Quit: 05/11/2015  . Smokeless tobacco: None  . Alcohol Use: 0.0 oz/week    0 Standard drinks or equivalent per week     Comment: occ    Review of Systems  All other systems reviewed and are negative.   Allergies  Penicillins and Sulfa antibiotics  Home Medications   Prior to Admission medications   Medication Sig Start Date End Date Taking? Authorizing Provider   dicyclomine (BENTYL) 20 MG tablet Take 1 tablet (20 mg total) by mouth 2 (two) times daily. 10/03/15   Noemi Chapel, MD  hydrochlorothiazide (HYDRODIURIL) 25 MG tablet Take 0.5 tablets (12.5 mg total) by mouth daily. 09/13/15   Isla Pence, MD   BP 162/101 mmHg  Pulse 71  Temp(Src) 98.2 F (36.8 C) (Oral)  Resp 18  Ht 5\' 9"  (1.753 m)  Wt 86.183 kg  BMI 28.05 kg/m2  SpO2 99% Physical Exam  Constitutional: He is oriented to person, place, and time. He appears well-developed and well-nourished.  HENT:  Head: Normocephalic and atraumatic.  Eyes: Conjunctivae are normal. Pupils are equal, round, and reactive to light. Right eye exhibits no discharge. Left eye exhibits no discharge. No scleral icterus.  Neck: Normal range of motion. No JVD present. No tracheal deviation present.  Pulmonary/Chest: Effort normal. No stridor.  Musculoskeletal:  Left shoulder nontender to palpation, full active range of motion in all directions. No crepitus noted, sensation intact, grip strength 5 out of 5, radial pulses 2+, Refill less than 3 seconds. Left scapular region and left lateral cervical musculature minor tenderness to palpation, full active range of motion of the neck, no pain with axial loading of the cervical spine. No redness, swelling, warmth to touch. No signs of infection  Neurological: He is alert and oriented to person, place, and time. Coordination normal.  Psychiatric: He has a normal mood and affect. His  behavior is normal. Judgment and thought content normal.  Nursing note and vitals reviewed.   ED Course  Procedures (including critical care time) Labs Review Labs Reviewed - No data to display  Imaging Review No results found. I have personally reviewed and evaluated these images and lab results as part of my medical decision-making.   EKG Interpretation None      MDM   Final diagnoses:  Left shoulder pain    Labs:  Imaging:  Consults:  Therapeutics:  Discharge  Meds:   Assessment/Plan: 45 year old male presents today with left shoulder pain this is likely musculoskeletal in nature. Patient has no neurological deficits, no infectious etiology. This is intermittent, likely related to overuse injury. Patient will be referred to sports medicine for follow-up and further evaluation. Return precautions given.         Okey Regal, PA-C 10/25/15 [place], MD 10/26/15 [phone]

## 2015-10-25 NOTE — Discharge Instructions (Signed)

## 2015-10-25 NOTE — ED Notes (Signed)
Patient here with ongoing thoracic, lumbar back pain and left shoulder/forearm pain x 3 weeks. Patient states that he hass been seen x 2 for same with labs, etc. And experiencing ongoing discomfort. Does work-out and lifting and unsure if related

## 2015-10-25 NOTE — ED Notes (Signed)
PA at bedside.

## 2015-11-02 ENCOUNTER — Emergency Department (HOSPITAL_BASED_OUTPATIENT_CLINIC_OR_DEPARTMENT_OTHER)
Admission: EM | Admit: 2015-11-02 | Discharge: 2015-11-02 | Disposition: A | Discharge status: Home / Self Care | Payer: Self-pay | Attending: Emergency Medicine | Admitting: Emergency Medicine

## 2015-11-02 ENCOUNTER — Encounter (HOSPITAL_BASED_OUTPATIENT_CLINIC_OR_DEPARTMENT_OTHER): Payer: Self-pay

## 2015-11-02 DIAGNOSIS — Y999 Unspecified external cause status: Secondary | ICD-10-CM | POA: Insufficient documentation

## 2015-11-02 DIAGNOSIS — Y929 Unspecified place or not applicable: Secondary | ICD-10-CM | POA: Insufficient documentation

## 2015-11-02 DIAGNOSIS — I10 Essential (primary) hypertension: Secondary | ICD-10-CM | POA: Insufficient documentation

## 2015-11-02 DIAGNOSIS — S80862A Insect bite (nonvenomous), left lower leg, initial encounter: Secondary | ICD-10-CM | POA: Insufficient documentation

## 2015-11-02 DIAGNOSIS — W57XXXA Bitten or stung by nonvenomous insect and other nonvenomous arthropods, initial encounter: Secondary | ICD-10-CM | POA: Insufficient documentation

## 2015-11-02 DIAGNOSIS — F17210 Nicotine dependence, cigarettes, uncomplicated: Secondary | ICD-10-CM | POA: Insufficient documentation

## 2015-11-02 DIAGNOSIS — Y939 Activity, unspecified: Secondary | ICD-10-CM | POA: Insufficient documentation

## 2015-11-02 NOTE — ED Notes (Signed)
Patient complains of left leg itching around possible insect bite. Small red area noted

## 2015-11-02 NOTE — Discharge Instructions (Signed)
There does not appear to be an emergent cause for your symptoms at this time. He may take Benadryl for itching. Follow-up with your doctor as needed. Return to ED for new or worsening symptoms.

## 2015-11-02 NOTE — ED Provider Notes (Signed)
CSN: [id number]     Arrival date & time 11/02/15  1708 History   First MD Initiated Contact with Patient 11/02/15 1716     Chief Complaint  Patient presents with  . Insect Bite     (Consider location/radiation/quality/duration/timing/severity/associated sxs/prior Treatment) HPI Ronald [name] is a 45 y.o. male here for evaluation of itchy lesion to his left calf onset this afternoon. 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 He reports putting a topical cream on it, which resolved the itching. He came in for evaluation because he wasn't sure what might have bit him. He denies any fevers, chills, numbness or weakness, nausea or vomiting, abdominal pain, difficulty swallowing, shortness of breath or other complaints. No other modifying factors.  Past Medical History  Diagnosis Date  . Hypertension    Past Surgical History  Procedure Laterality Date  . Arm surgery      torn bicept   Family History  Problem Relation Age of Onset  . Hypertension Mother   . Hypertension Father   . Cancer Other    Social History  Substance Use Topics  . Smoking status: Current Some Day Smoker -- 0.00 packs/day    Types: Cigarettes    Last Attempt to Quit: 05/11/2015  . Smokeless tobacco: None  . Alcohol Use: 0.0 oz/week    0 Standard drinks or equivalent per week     Comment: occ    Review of Systems A 10 point review of systems was completed and was negative except for pertinent positives and negatives as mentioned in the history of present illness     Allergies  Penicillins and Sulfa antibiotics  Home Medications   Prior to Admission medications   Medication Sig Start Date End Date Taking? Authorizing Provider  hydrochlorothiazide (HYDRODIURIL) 25 MG tablet Take 0.5 tablets (12.5 mg total) by mouth daily. 09/13/15   Isla Pence, MD   BP 153/94 mmHg  Pulse 83  Temp(Src) 98.2  F (36.8 C)  Resp 20  Ht 5\' 9"  (1.753 m)  Wt 87.544 kg  BMI 28.49 kg/m2  SpO2 100% Physical Exam  Constitutional:  Awake, alert, nontoxic appearance.  HENT:  Head: Atraumatic.  Eyes: Right eye exhibits no discharge. Left eye exhibits no discharge.  Neck: Neck supple.  Pulmonary/Chest: Effort normal. He exhibits no tenderness.  Abdominal: Soft. There is no tenderness. There is no rebound.  Musculoskeletal: He exhibits no tenderness.  Baseline ROM, no obvious new focal weakness.  Neurological:  Mental status and motor strength appears baseline for patient and situation.  Skin: No rash noted.  Very small, circumferential area of erythema to left lateral mid calf, approximately 0.5 cm in diameter. Maculopapular.  Psychiatric: He has a normal mood and affect.  Nursing note and vitals reviewed.   ED Course  Procedures (including critical care time) Labs Review Labs Reviewed - No data to display  Imaging Review No results found. I have personally reviewed and evaluated these images and lab results as part of my medical decision-making.   EKG Interpretation None      MDM  Patient presents for evaluation of pruritic lesion. Consistent with mosquito bite. No evidence of secondary infection or other emergent pathology. Symptoms resolved prior to arrival with topical lotion. Encourage further symptomatic support at home and follow-up with PCP. Final diagnoses:  Insect bite       [name], PA-C 11/02/15 1731  Harvel Quale, MD 11/07/15 [phone]

## 2015-11-07 ENCOUNTER — Emergency Department: Payer: Self-pay

## 2015-11-07 ENCOUNTER — Emergency Department
Admission: EM | Admit: 2015-11-07 | Discharge: 2015-11-07 | Disposition: A | Discharge status: Home / Self Care | Payer: Self-pay | Attending: Emergency Medicine | Admitting: Emergency Medicine

## 2015-11-07 ENCOUNTER — Other Ambulatory Visit: Payer: Self-pay

## 2015-11-07 ENCOUNTER — Encounter: Payer: Self-pay | Admitting: Emergency Medicine

## 2015-11-07 DIAGNOSIS — R079 Chest pain, unspecified: Secondary | ICD-10-CM

## 2015-11-07 DIAGNOSIS — R0789 Other chest pain: Secondary | ICD-10-CM | POA: Insufficient documentation

## 2015-11-07 DIAGNOSIS — I10 Essential (primary) hypertension: Secondary | ICD-10-CM | POA: Insufficient documentation

## 2015-11-07 DIAGNOSIS — F17210 Nicotine dependence, cigarettes, uncomplicated: Secondary | ICD-10-CM | POA: Insufficient documentation

## 2015-11-07 LAB — BASIC METABOLIC PANEL
ANION GAP: 8 (ref 5–15)
BUN: 17 mg/dL (ref 6–20)
CALCIUM: 9 mg/dL (ref 8.9–10.3)
CHLORIDE: 106 mmol/L (ref 101–111)
CO2: 23 mmol/L (ref 22–32)
CREATININE: 1.28 mg/dL — AB (ref 0.61–1.24)
GFR calc non Af Amer: >60 mL/min (ref >60)
Glucose, Bld: 102 mg/dL — ABNORMAL HIGH (ref 65–99)
POTASSIUM: 3.4 mmol/L — AB (ref 3.5–5.1)
SODIUM: 137 mmol/L (ref 135–145)

## 2015-11-07 LAB — RAPID HIV SCREEN (HIV 1/2 AB+AG)
HIV 1/2 Antibodies: NONREACTIVE
HIV-1 P24 ANTIGEN - HIV24: NONREACTIVE

## 2015-11-07 LAB — CBC
HCT: 40.1 % (ref 40.0–52.0)
Hemoglobin: 13.8 g/dL (ref 13.0–18.0)
MCH: 30.1 pg (ref 26.0–34.0)
MCHC: 34.4 g/dL (ref 32.0–36.0)
MCV: 87.4 fL (ref 80.0–100.0)
PLATELETS: 219 10*3/uL (ref 150–440)
RBC: 4.59 MIL/uL (ref 4.40–5.90)
RDW: 13.4 % (ref 11.5–14.5)
WBC: 4.1 10*3/uL (ref 3.8–10.6)

## 2015-11-07 LAB — TROPONIN I

## 2015-11-07 MED ORDER — ASPIRIN EC 81 MG PO TBEC
81.0000 mg | DELAYED_RELEASE_TABLET | Freq: Once | ORAL | Status: AC
  Administered 2015-11-07: 81 mg via ORAL
  Filled 2015-11-07: qty 1

## 2015-11-07 MED ORDER — LORAZEPAM 1 MG PO TABS: 1.0000 mg | ORAL_TABLET | Freq: Three times a day (TID) | ORAL | Status: DC | PRN

## 2015-11-07 MED ORDER — HYDROCHLOROTHIAZIDE 25 MG PO TABS: 25.0000 mg | ORAL_TABLET | Freq: Every day | ORAL | Status: DC

## 2015-11-07 NOTE — ED Notes (Signed)
Patient presents to the ED with sudden onset of sharp chest pain about 30 minutes pta.  Patient reports breaking out into a sweat and feeling short of breath.  Patient reports history of hypertension.  Denies cardiac history at this time.  Patient is alert and oriented x 3.  No obvious distress at this time.

## 2015-11-07 NOTE — ED Notes (Signed)
Pt presents with left sided chest pain while at work this am carrying a two by four wooden board. Pt states he got sick on his stomach and broke out in a sweat. Pt states his wife is pregnant and his son was up all night and pt states he is under a lot of stress right now.

## 2015-11-07 NOTE — ED Provider Notes (Signed)
Time Seen: Approximately 0 940  I have reviewed the triage notes  Chief Complaint: Chest Pain   History of Present Illness: Ronald [name] is a 45 y.o. male *who presents with episode of chest discomfort that started earlier today. He states he started to feel improved. He states he was carrying 2 wooden boards and started getting nauseated and broke out into a sweat. He states he feels very anxious and has a lot of stress at home with his family at this time. He has minimal cardiac risk factors other than not tobacco usage and also has had a previous recent history of cocaine and alcohol consumption. She did not use any cocaine today prior to this episode. Patient states his pain is been gradually improving since this started approximately 8:30 this morning. Denies any fever or chills productive cough or wheezing.   Past Medical History  Diagnosis Date  . Hypertension     Patient Active Problem List   Diagnosis Date Noted  . Lower leg injury 01/19/2015    Past Surgical History  Procedure Laterality Date  . Arm surgery      torn bicept    Past Surgical History  Procedure Laterality Date  . Arm surgery      torn bicept    Current Outpatient Rx  Name  Route  Sig  Dispense  Refill  . hydrochlorothiazide (HYDRODIURIL) 25 MG tablet   Oral   Take 1 tablet (25 mg total) by mouth daily.   0.5 tablet   1   . LORazepam (ATIVAN) 1 MG tablet   Oral   Take 1 tablet (1 mg total) by mouth every 8 (eight) hours as needed for anxiety.   30 tablet   0     Allergies:  Penicillins and Sulfa antibiotics  Family History: Family History  Problem Relation Age of Onset  . Hypertension Mother   . Hypertension Father   . Cancer Other     Social History: Social History  Substance Use Topics  . Smoking status: Current Some Day Smoker -- 0.00 packs/day    Types: Cigarettes    Last Attempt to Quit: 05/11/2015  . Smokeless tobacco: None  . Alcohol Use: 0.0 oz/week    0 Standard  drinks or equivalent per week     Comment: occ     Review of Systems:   10 point review of systems was performed and was otherwise negative:  Constitutional: No fever Eyes: No visual disturbances ENT: No sore throat, ear pain Cardiac: Chest pain did not radiate to the arm or jaw or back region. Respiratory: Mild shortness of breath shortness of breath, no wheezing, or stridor Abdomen: No abdominal pain, no vomiting, No diarrhea Endocrine: No weight loss, No night sweats Extremities: No peripheral edema, cyanosis Skin: No rashes, easy bruising Neurologic: No focal weakness, trouble with speech or swollowing Urologic: No dysuria, Hematuria, or urinary frequency Patient states he also had some unprotected sex with a new sexual partner and was concerned about the possibility of HIV and requested HIV testing  Physical Exam:  ED Triage Vitals  Enc Vitals Group     BP 11/07/15 0827 156/97 mmHg     Pulse Rate 11/07/15 0827 72     Resp 11/07/15 0827 18     Temp 11/07/15 0827 98.6 F (37 C)     Temp src --      SpO2 11/07/15 0827 98 %     Weight 11/07/15 0827 191 lb (86.637 kg)  Height 11/07/15 0827 5\' 9"  (1.753 m)     Head Cir --      Peak Flow --      Pain Score 11/07/15 0828 6     Pain Loc --      Pain Edu? --      Excl. in [place]? --     General: Awake , Alert , and Oriented times 3; GCS 15 Head: Normal cephalic , atraumatic Eyes: Pupils equal , round, reactive to light Nose/Throat: No nasal drainage, patent upper airway without erythema or exudate.  Neck: Supple, Full range of motion, No anterior adenopathy or palpable thyroid masses Lungs: Clear to ascultation without wheezes , rhonchi, or rales Heart: Regular rate, regular rhythm without murmurs , gallops , or rubs Abdomen: Soft, non tender without rebound, guarding , or rigidity; bowel sounds positive and symmetric in all 4 quadrants. No organomegaly .        Extremities: 2 plus symmetric pulses. No edema, clubbing or  cyanosis Neurologic: normal ambulation, Motor symmetric without deficits, sensory intact Skin: warm, dry, no rashes   Labs:   All laboratory work was reviewed including any pertinent negatives or positives listed below:  Labs Reviewed  BASIC METABOLIC PANEL - Abnormal; Notable for the following:    Potassium 3.4 (*)    Glucose, Bld 102 (*)    Creatinine, Ser 1.28 (*)    All other components within normal limits  CBC  TROPONIN I  RAPID HIV SCREEN (HIV 1/2 AB+AG)    EKG: * EKG was reviewed and shows some nonspecific T-wave inversions located mostly in the lateral leads Normal sinus rhythm No acute ischemic changes Regular rate   Radiology: *   EXAM: CHEST 2 VIEW  COMPARISON: Chest radiograph dated 07/08/2015  FINDINGS: The heart size and mediastinal contours are within normal limits. Both lungs are clear. The visualized skeletal structures are unremarkable.  IMPRESSION: No active cardiopulmonary disease.   Electronically Signed   I personally reviewed the radiologic studies    ED Course:  Patient's stay here was uneventful and he was advised at this point his EKG doesn't show any specific changes and his first troponin level was negative. The patient and I plan to perform a second troponin level on him but he is requesting to leave at this time. I advised him that he definitely requires further outpatient follow-up was referred to cardiology unassigned. He was advised take an aspirin a day. Patient has a history of cocaine and alcohol usage and these items in conjunction can cause a significant cardiomyopathy, etc. Patient appears to be of understanding and still wishes to depart from the emergency department. He was given a prescription for Ativan for some underlying anxiety. He is not suicidal, homicidal, or having hallucinations but I felt he would benefit with benzodiazepine therapy for anxiety.*    Assessment:  Acute unspecified chest pain   Final  Clinical Impression:   Final diagnoses:  Chest pain, unspecified chest pain type     Plan:  Patient was discharged with an incomplete workup. He was advised to return here if he wishes to continue with his chest pain evaluation which would be another troponin level or has any other new concerns. He is aware of his workup is not complete and still wishes to part from the emergency department.            Daymon Larsen, MD 11/07/15 [phone]

## 2015-11-07 NOTE — Discharge Instructions (Signed)
Nonspecific Chest Pain °It is often hard to find the cause of chest pain. There is always a chance that your pain could be related to something serious, such as a heart attack or a blood clot in your lungs. Chest pain can also be caused by conditions that are not life-threatening. If you have chest pain, it is very important to follow up with your doctor. ° °HOME CARE °· If you were prescribed an antibiotic medicine, finish it all even if you start to feel better. °· Avoid any activities that cause chest pain. °· Do not use any tobacco products, including cigarettes, chewing tobacco, or electronic cigarettes. If you need help quitting, ask your doctor. °· Do not drink alcohol. °· Take medicines only as told by your doctor. °· Keep all follow-up visits as told by your doctor. This is important. This includes any further testing if your chest pain does not go away. °· Your doctor may tell you to keep your head raised (elevated) while you sleep. °· Make lifestyle changes as told by your doctor. These may include: °¨ Getting regular exercise. Ask your doctor to suggest some activities that are safe for you. °¨ Eating a heart-healthy diet. Your doctor or a diet specialist (dietitian) can help you to learn healthy eating options. °¨ Maintaining a healthy weight. °¨ Managing diabetes, if necessary. °¨ Reducing stress. °GET HELP IF: °· Your chest pain does not go away, even after treatment. °· You have a rash with blisters on your chest. °· You have a fever. °GET HELP RIGHT AWAY IF: °· Your chest pain is worse. °· You have an increasing cough, or you cough up blood. °· You have severe belly (abdominal) pain. °· You feel extremely weak. °· You pass out (faint). °· You have chills. °· You have sudden, unexplained chest discomfort. °· You have sudden, unexplained discomfort in your arms, back, neck, or jaw. °· You have shortness of breath at any time. °· You suddenly start to sweat, or your skin gets clammy. °· You feel  nauseous. °· You vomit. °· You suddenly feel light-headed or dizzy. °· Your heart begins to beat quickly, or it feels like it is skipping beats. °These symptoms may be an emergency. Do not wait to see if the symptoms will go away. Get medical help right away. Call your local emergency services (911 in the [place] Do not drive yourself to the hospital. °  °This information is not intended to replace advice given to you by your health care provider. Make sure you discuss any questions you have with your health care provider. °  °Document Released: 10/31/2007 Document Revised: 06/04/2014 Document Reviewed: 12/18/2013 °Elsevier Interactive Patient Education ©2016 [employer] ° °Please return immediately if condition worsens. Please contact her primary physician or the physician you were given for referral. If you have any specialist physicians involved in her treatment and plan please also contact them. Thank you for using [facility]. ° °

## 2015-11-07 NOTE — Care Management Note (Signed)
Case Management Note  Patient Details  Name: Ronald [name] MRN: [id number] Date of Birth: Oct 31, 1970  Subjective/Objective:   Poke to pt. At bedside. He does not remember of he was referred to a clinic or not, to establish a PCP. I have given him the info for the community clinic in [facility]. They offer sliding scale care. The patient states he has no more questions at this point.                  Action/Plan:   Expected Discharge Date:                  Expected Discharge Plan:     In-House Referral:     Discharge planning Services     Post Acute Care Choice:    Choice offered to:     DME Arranged:    DME Agency:     HH Arranged:    [place] Agency:     Status of Service:     Medicare Important Message Given:    Date Medicare IM Given:    Medicare IM give by:    Date Additional Medicare IM Given:    Additional Medicare Important Message give by:     If discussed at [place] of Stay Meetings, dates discussed:    Additional Comments:  Beau Fanny, RN 11/07/2015, 8:48 AM

## 2015-12-15 ENCOUNTER — Observation Stay (HOSPITAL_BASED_OUTPATIENT_CLINIC_OR_DEPARTMENT_OTHER)
Admission: EM | Admit: 2015-12-15 | Discharge: 2015-12-16 | Disposition: A | Discharge status: Home / Self Care | Payer: Self-pay | Attending: Cardiology | Admitting: Cardiology

## 2015-12-15 ENCOUNTER — Emergency Department (HOSPITAL_BASED_OUTPATIENT_CLINIC_OR_DEPARTMENT_OTHER): Payer: Self-pay

## 2015-12-15 ENCOUNTER — Encounter (HOSPITAL_BASED_OUTPATIENT_CLINIC_OR_DEPARTMENT_OTHER): Payer: Self-pay

## 2015-12-15 DIAGNOSIS — Z882 Allergy status to sulfonamides status: Secondary | ICD-10-CM | POA: Insufficient documentation

## 2015-12-15 DIAGNOSIS — I10 Essential (primary) hypertension: Secondary | ICD-10-CM | POA: Insufficient documentation

## 2015-12-15 DIAGNOSIS — R0602 Shortness of breath: Secondary | ICD-10-CM | POA: Insufficient documentation

## 2015-12-15 DIAGNOSIS — R0789 Other chest pain: Principal | ICD-10-CM | POA: Insufficient documentation

## 2015-12-15 DIAGNOSIS — R079 Chest pain, unspecified: Secondary | ICD-10-CM

## 2015-12-15 DIAGNOSIS — I700 Atherosclerosis of aorta: Secondary | ICD-10-CM | POA: Insufficient documentation

## 2015-12-15 DIAGNOSIS — Z87891 Personal history of nicotine dependence: Secondary | ICD-10-CM | POA: Insufficient documentation

## 2015-12-15 DIAGNOSIS — Z880 Allergy status to penicillin: Secondary | ICD-10-CM | POA: Insufficient documentation

## 2015-12-15 DIAGNOSIS — Z7982 Long term (current) use of aspirin: Secondary | ICD-10-CM | POA: Insufficient documentation

## 2015-12-15 LAB — CBC WITH DIFFERENTIAL/PLATELET
BASOS ABS: 0.1 10*3/uL (ref 0.0–0.1)
Basophils Relative: 1 %
Eosinophils Absolute: 0.1 10*3/uL (ref 0.0–0.7)
Eosinophils Relative: 2 %
HEMATOCRIT: 41.7 % (ref 39.0–52.0)
Hemoglobin: 14.8 g/dL (ref 13.0–17.0)
LYMPHS PCT: 43 %
Lymphs Abs: 1.9 10*3/uL (ref 0.7–4.0)
MCH: 31.2 pg (ref 26.0–34.0)
MCHC: 35.5 g/dL (ref 30.0–36.0)
MCV: 88 fL (ref 78.0–100.0)
Monocytes Absolute: 0.4 10*3/uL (ref 0.1–1.0)
Monocytes Relative: 9 %
NEUTROS ABS: 2 10*3/uL (ref 1.7–7.7)
Neutrophils Relative %: 45 %
PLATELETS: 250 10*3/uL (ref 150–400)
RBC: 4.74 MIL/uL (ref 4.22–5.81)
RDW: 12.4 % (ref 11.5–15.5)
WBC: 4.5 10*3/uL (ref 4.0–10.5)

## 2015-12-15 LAB — BASIC METABOLIC PANEL
ANION GAP: 8 (ref 5–15)
BUN: 16 mg/dL (ref 6–20)
CO2: 28 mmol/L (ref 22–32)
Calcium: 9.7 mg/dL (ref 8.9–10.3)
Chloride: 101 mmol/L (ref 101–111)
Creatinine, Ser: 1.25 mg/dL — ABNORMAL HIGH (ref 0.61–1.24)
Glucose, Bld: 95 mg/dL (ref 65–99)
POTASSIUM: 3.5 mmol/L (ref 3.5–5.1)
SODIUM: 137 mmol/L (ref 135–145)

## 2015-12-15 LAB — TROPONIN I: Troponin I: <0.03 ng/mL (ref <0.03)

## 2015-12-15 MED ORDER — ASPIRIN EC 81 MG PO TBEC
81.0000 mg | DELAYED_RELEASE_TABLET | Freq: Every day | ORAL | Status: DC
  Administered 2015-12-16: 81 mg via ORAL
  Filled 2015-12-15: qty 1

## 2015-12-15 MED ORDER — HYDROCHLOROTHIAZIDE 25 MG PO TABS
25.0000 mg | ORAL_TABLET | Freq: Every day | ORAL | Status: DC
  Administered 2015-12-15 – 2015-12-16 (×2): 25 mg via ORAL
  Filled 2015-12-15 (×2): qty 1

## 2015-12-15 MED ORDER — NITROGLYCERIN 2 % TD OINT
1.0000 [in_us] | TOPICAL_OINTMENT | Freq: Once | TRANSDERMAL | Status: AC
  Administered 2015-12-15: 1 [in_us] via TOPICAL
  Filled 2015-12-15: qty 1

## 2015-12-15 MED ORDER — NITROGLYCERIN 0.4 MG SL SUBL: 0.4000 mg | SUBLINGUAL_TABLET | SUBLINGUAL | Status: DC | PRN

## 2015-12-15 MED ORDER — ACETAMINOPHEN 325 MG PO TABS
650.0000 mg | ORAL_TABLET | ORAL | Status: DC | PRN
Start: 2015-12-15 — End: 2015-12-16
  Administered 2015-12-15: 650 mg via ORAL
  Filled 2015-12-15: qty 2

## 2015-12-15 MED ORDER — ONDANSETRON HCL 4 MG/2ML IJ SOLN: 4.0000 mg | Freq: Four times a day (QID) | INTRAMUSCULAR | Status: DC | PRN

## 2015-12-15 MED ORDER — ASPIRIN 81 MG PO CHEW
324.0000 mg | CHEWABLE_TABLET | Freq: Once | ORAL | Status: AC
  Administered 2015-12-15: 324 mg via ORAL
  Filled 2015-12-15: qty 4

## 2015-12-15 MED ORDER — ENOXAPARIN SODIUM 40 MG/0.4ML ~~LOC~~ SOLN: 40.0000 mg | SUBCUTANEOUS | Status: DC

## 2015-12-15 NOTE — ED Notes (Signed)
Pt on heart monitor 

## 2015-12-15 NOTE — ED Notes (Signed)
C/o SOB and chest tightness-intermittent x 2-3 days-also c/o "knot right calf" x 1 week-NAD-steady gait

## 2015-12-15 NOTE — ED Provider Notes (Signed)
CSN: [id number]     Arrival date & time 12/15/15  1428 History   First MD Initiated Contact with Patient 12/15/15 1502     Chief Complaint  Patient presents with  . Shortness of Breath     (Consider location/radiation/quality/duration/timing/severity/associated sxs/prior Treatment) HPI Complains of anterior chest tightness, he by shortness of breath intermittently over the past week. Symptoms are "between sets" when I exercise and has notices had increased dyspnea with exertion over the past week. He reports that he had a "knot" in his right calf after running across [employer] 1 week ago. Not is no longer there. No other associated symptoms. No treatment prior to coming here. Chest Heaviness is now mild. Past Medical History  Diagnosis Date  . Hypertension    Past Surgical History  Procedure Laterality Date  . Arm surgery      torn bicept   Family History  Problem Relation Age of Onset  . Hypertension Mother   . Hypertension Father   . Cancer Other    Social History  Substance Use Topics  . Smoking status: Former Smoker -- 0.00 packs/day    Types: Cigarettes    Quit date: 05/11/2015  . Smokeless tobacco: None  . Alcohol Use: No  Ex-smoker quit 2 months ago. Smoked for 10 years Last used cocaine approximately a year ago. Used MDMA 3-4 months ago. No drug use since. No history of IV drug use  Review of Systems  Constitutional: Negative.   HENT: Negative.   Respiratory: Positive for shortness of breath.   Cardiovascular: Positive for chest pain.  Gastrointestinal: Negative.   Musculoskeletal: Negative.   Skin: Negative.   Neurological: Negative.   Psychiatric/Behavioral: Negative.   All other systems reviewed and are negative.     Allergies  Penicillins and Sulfa antibiotics  Home Medications   Prior to Admission medications   Medication Sig Start Date End Date Taking? Authorizing Provider  LORazepam (ATIVAN) 1 MG tablet Take 1 tablet (1 mg total) by mouth  every 8 (eight) hours as needed for anxiety. 11/07/15 11/06/16  Daymon Larsen, MD   BP 133/96 mmHg  Pulse 67  Temp(Src) 98.2 F (36.8 C) (Oral)  Resp 16  Ht 5\' 9"  (1.753 m)  Wt 190 lb (86.183 kg)  BMI 28.05 kg/m2  SpO2 100% Physical Exam  Constitutional: He appears well-developed and well-nourished. No distress.  HENT:  Head: Normocephalic and atraumatic.  Eyes: Conjunctivae are normal. Pupils are equal, round, and reactive to light.  Neck: Neck supple. No tracheal deviation present. No thyromegaly present.  Cardiovascular: Normal rate and regular rhythm.   No murmur heard. Pulmonary/Chest: Effort normal and breath sounds normal.  Abdominal: Soft. Bowel sounds are normal. He exhibits no distension. There is no tenderness.  Musculoskeletal: Normal range of motion. He exhibits no edema or tenderness.  Neurological: He is alert. Coordination normal.  Skin: Skin is warm and dry. No rash noted.  Psychiatric: He has a normal mood and affect.  Nursing note and vitals reviewed.   ED Course  Procedures (including critical care time) Labs Review Labs Reviewed - No data to display  Imaging Review No results found. I have personally reviewed and evaluated these images and lab results as part of my medical decision-making.   EKG Interpretation   Date/Time:  Thursday December 15 2015 15:02:08 EDT Ventricular Rate:  68 PR Interval:    QRS Duration: 102 QT Interval:  413 QTC Calculation: 440 R Axis:   76 Text Interpretation:  Sinus rhythm Consider left atrial enlargement  Abnormal R-wave progression, early transition Abnormal T, consider  ischemia, anterior leads Confirmed by Winfred Leeds  MD, Kron Everton [phone]) on  12/15/2015 3:22:16 PM     3:25 PM patient asymptomatic, without treatment. Results for orders placed or performed during the hospital encounter of [id number]  Basic metabolic panel  Result Value Ref Range   Sodium 137 135 - 145 mmol/L   Potassium 3.5 3.5 - 5.1 mmol/L   Chloride  101 101 - 111 mmol/L   CO2 28 22 - 32 mmol/L   Glucose, Bld 95 65 - 99 mg/dL   BUN 16 6 - 20 mg/dL   Creatinine, Ser 1.25 (H) 0.61 - 1.24 mg/dL   Calcium 9.7 8.9 - 10.3 mg/dL   GFR calc non Af Amer >60 >60 mL/min   GFR calc Af Amer >60 >60 mL/min   Anion gap 8 5 - 15  CBC with Differential/Platelet  Result Value Ref Range   WBC 4.5 4.0 - 10.5 K/uL   RBC 4.74 4.22 - 5.81 MIL/uL   Hemoglobin 14.8 13.0 - 17.0 g/dL   HCT 41.7 39.0 - 52.0 %   MCV 88.0 78.0 - 100.0 fL   MCH 31.2 26.0 - 34.0 pg   MCHC 35.5 30.0 - 36.0 g/dL   RDW 12.4 11.5 - 15.5 %   Platelets 250 150 - 400 K/uL   Neutrophils Relative % 45 %   Neutro Abs 2.0 1.7 - 7.7 K/uL   Lymphocytes Relative 43 %   Lymphs Abs 1.9 0.7 - 4.0 K/uL   Monocytes Relative 9 %   Monocytes Absolute 0.4 0.1 - 1.0 K/uL   Eosinophils Relative 2 %   Eosinophils Absolute 0.1 0.0 - 0.7 K/uL   Basophils Relative 1 %   Basophils Absolute 0.1 0.0 - 0.1 K/uL  Troponin I  Result Value Ref Range   Troponin I <0.03 <0.03 ng/mL   Dg Chest Port 1 View  12/15/2015  CLINICAL DATA:  Shortness of breath and chest tightness for 3 days EXAM: PORTABLE CHEST 1 VIEW COMPARISON:  November 07, 2015 FINDINGS: Lungs are clear. Heart size and pulmonary vascularity are normal. No adenopathy. Atherosclerotic calcification is noted in the aortic arch. No bone lesions. No pneumothorax. IMPRESSION: Aortic atherosclerosis.  No edema or consolidation. Electronically Signed   By: Lowella Grip III M.D.   On: 12/15/2015 15:53   Chest x-ray viewed by me Results for orders placed or performed during the hospital encounter of [id number]  Basic metabolic panel  Result Value Ref Range   Sodium 137 135 - 145 mmol/L   Potassium 3.5 3.5 - 5.1 mmol/L   Chloride 101 101 - 111 mmol/L   CO2 28 22 - 32 mmol/L   Glucose, Bld 95 65 - 99 mg/dL   BUN 16 6 - 20 mg/dL   Creatinine, Ser 1.25 (H) 0.61 - 1.24 mg/dL   Calcium 9.7 8.9 - 10.3 mg/dL   GFR calc non Af Amer >60 >60 mL/min   GFR  calc Af Amer >60 >60 mL/min   Anion gap 8 5 - 15  CBC with Differential/Platelet  Result Value Ref Range   WBC 4.5 4.0 - 10.5 K/uL   RBC 4.74 4.22 - 5.81 MIL/uL   Hemoglobin 14.8 13.0 - 17.0 g/dL   HCT 41.7 39.0 - 52.0 %   MCV 88.0 78.0 - 100.0 fL   MCH 31.2 26.0 - 34.0 pg   MCHC 35.5 30.0 - 36.0 g/dL  RDW 12.4 11.5 - 15.5 %   Platelets 250 150 - 400 K/uL   Neutrophils Relative % 45 %   Neutro Abs 2.0 1.7 - 7.7 K/uL   Lymphocytes Relative 43 %   Lymphs Abs 1.9 0.7 - 4.0 K/uL   Monocytes Relative 9 %   Monocytes Absolute 0.4 0.1 - 1.0 K/uL   Eosinophils Relative 2 %   Eosinophils Absolute 0.1 0.0 - 0.7 K/uL   Basophils Relative 1 %   Basophils Absolute 0.1 0.0 - 0.1 K/uL  Troponin I  Result Value Ref Range   Troponin I <0.03 <0.03 ng/mL   Dg Chest Port 1 View  12/15/2015  CLINICAL DATA:  Shortness of breath and chest tightness for 3 days EXAM: PORTABLE CHEST 1 VIEW COMPARISON:  November 07, 2015 FINDINGS: Lungs are clear. Heart size and pulmonary vascularity are normal. No adenopathy. Atherosclerotic calcification is noted in the aortic arch. No bone lesions. No pneumothorax. IMPRESSION: Aortic atherosclerosis.  No edema or consolidation. Electronically Signed   By: Lowella Grip III M.D.   On: 12/15/2015 15:53    5:10 PM patient remains  asymptomatic after treatment with aspirin and nitroglycerin paste  MDM  Dr. Percival Spanish from cardiology service consulted and accepts patient in transfer to telemetry at [facility]. Final diagnoses:  None  Diagnosis chest pain at rest      Orlie Dakin, MD 12/15/15 [phone]

## 2015-12-15 NOTE — H&P (Signed)
CARDIOLOGY ADMISSION NOTE  Patient ID: Garlon Kerbo MRN: [id number] DOB/AGE: Sep 15, 1970 45 y.o.  Admit date: 12/15/2015 Primary Physician   No PCP Per Patient Primary Cardiologist   None Chief Complaint    Chest pain.   HPI:  The patient has no past cardiac history.  He presented to [facility] with chest pain.  He gives a somewhat vague history of having some chest pain while working out.  However, he did a have weight lifting and aerobic work out.  He did this on an empty stomach.  He is taking multiple supplements.  He describes the pain as coming and going.  It seems to be mid chest.  There was no radiation.  It was mild.  There were no associated symptoms.  He has not had this discomfort before.  He can be very physically active without bringing on this discomfort.  His pain went away before he came to the ED.    In the ED, enzymes were negative.   EKG showed anterolateral T wave inversion that was unchanged from previous EKGs.     Past Medical History  Diagnosis Date  . Hypertension     Past Surgical History  Procedure Laterality Date  . Arm surgery      torn bicept    Allergies  Allergen Reactions  . Penicillins Anaphylaxis    Has patient had a PCN reaction causing immediate rash, facial/tongue/throat swelling, SOB or lightheadedness with hypotension: yes Has patient had a PCN reaction causing severe rash involving mucus membranes or skin necrosis: unknown Has patient had a PCN reaction that required hospitalization yes Has patient had a PCN reaction occurring within the last 10 years: no If all of the above answers are "NO", then may proceed with Cephalosporin use. \  . Sulfa Antibiotics Anaphylaxis   No current facility-administered medications on file prior to encounter.   Current Outpatient Prescriptions on File Prior to Encounter  Medication Sig Dispense Refill  . LORazepam (ATIVAN) 1 MG tablet Take 1 tablet (1 mg total) by mouth every 8 (eight) hours as  needed for anxiety. 30 tablet 0  . [DISCONTINUED] lisinopril-hydrochlorothiazide (PRINZIDE,ZESTORETIC) 10-12.5 MG per tablet Take 1 tablet by mouth daily. 30 tablet 0   Social History   Social History  . Marital Status: Married    Spouse Name: N/A  . Number of Children: N/A  . Years of Education: N/A   Occupational History  . Not on file.   Social History Main Topics  . Smoking status: Former Smoker -- 0.00 packs/day    Types: Cigarettes    Quit date: 05/11/2015  . Smokeless tobacco: Not on file  . Alcohol Use: No  . Drug Use: No  . Sexual Activity: Not on file   Other Topics Concern  . Not on file   Social History Narrative    Family History  Problem Relation Age of Onset  . Hypertension Mother   . Hypertension Father   . Cancer Other      ROS:  As stated in the HPI and negative for all other systems.  Physical Exam: Blood pressure 127/71, pulse 66, temperature 97.8 F (36.6 C), temperature source Oral, resp. rate 18, height 5\' 9"  (1.753 m), weight 190 lb (86.183 kg), SpO2 100 %.  GENERAL:  Well appearing HEENT:  Pupils equal round and reactive, fundi not visualized, oral mucosa unremarkable NECK:  No jugular venous distention, waveform within normal limits, carotid upstroke brisk and symmetric, no bruits, no thyromegaly  LYMPHATICS:  No cervical, inguinal adenopathy LUNGS:  Clear to auscultation bilaterally BACK:  No CVA tenderness CHEST:  Unremarkable HEART:  PMI not displaced or sustained,S1 and S2 within normal limits, no S3, no S4, no clicks, no rubs, no murmurs ABD:  Flat, positive bowel sounds normal in frequency in pitch, no bruits, no rebound, no guarding, no midline pulsatile mass, no hepatomegaly, no splenomegaly EXT:  2 plus pulses throughout, no edema, no cyanosis no clubbing SKIN:  No rashes no nodules NEURO:  Cranial nerves II through XII grossly intact, motor grossly intact throughout PSYCH:  Cognitively intact, oriented to person place and  time  Labs: Lab Results  Component Value Date   BUN 16 12/15/2015   Lab Results  Component Value Date   CREATININE 1.25* 12/15/2015   Lab Results  Component Value Date   NA 137 12/15/2015   K 3.5 12/15/2015   CL 101 12/15/2015   CO2 28 12/15/2015   Lab Results  Component Value Date   TROPONINI <0.03 12/15/2015   Lab Results  Component Value Date   WBC 4.5 12/15/2015   HGB 14.8 12/15/2015   HCT 41.7 12/15/2015   MCV 88.0 12/15/2015   PLT 250 12/15/2015   No results found for: CHOL, HDL, LDLCALC, LDLDIRECT, TRIG, CHOLHDL Lab Results  Component Value Date   ALT 26 07/26/2015   AST 23 07/26/2015   ALKPHOS 109 07/26/2015   BILITOT 0.5 07/26/2015     Radiology:  CXR:  Lungs are clear. Heart size and pulmonary vascularity are normal. No adenopathy. Atherosclerotic calcification is noted in the aortic arch. No bone lesions. No pneumothorax.  EKG:  NSR, rate 68, axis WNL intervals WNL, anterolateral T wave inversion consistent with repolarization changes.  Mild criteria for LVH.   ASSESSMENT AND PLAN:    CHEST PAIN:  Atypical.  EKG likely represents early repolarization changes.  His pain is very atypical.  Cycle enzymes.  If negative then no further cardiac testing is indicated other than below.  HTN:  BP is controlled here.  However, he reports that his BP is elevated at home and he says he "just" ran out of his meds.  His EKG suggests hypertensive heart disease.  I will order an echo.  I talked to him at length about the importance of BP control.     SignedMinus Breeding 12/15/2015, 6:40 PM

## 2015-12-15 NOTE — ED Notes (Signed)
MD at bedside. 

## 2015-12-15 NOTE — ED Notes (Signed)
Talked with pt about possibility of drawing blood and needing an IV. State he wants to wait to be seen by MD before being stuck.

## 2015-12-16 ENCOUNTER — Observation Stay (HOSPITAL_BASED_OUTPATIENT_CLINIC_OR_DEPARTMENT_OTHER): Payer: Self-pay

## 2015-12-16 DIAGNOSIS — R079 Chest pain, unspecified: Secondary | ICD-10-CM

## 2015-12-16 LAB — ECHOCARDIOGRAM COMPLETE
HEIGHTINCHES: 69 in
WEIGHTICAEL: 3033.53 [oz_av]

## 2015-12-16 LAB — BASIC METABOLIC PANEL
ANION GAP: 8 (ref 5–15)
BUN: 15 mg/dL (ref 6–20)
CALCIUM: 9.5 mg/dL (ref 8.9–10.3)
CO2: 25 mmol/L (ref 22–32)
Chloride: 104 mmol/L (ref 101–111)
Creatinine, Ser: 1.24 mg/dL (ref 0.61–1.24)
Glucose, Bld: 93 mg/dL (ref 65–99)
Potassium: 3.5 mmol/L (ref 3.5–5.1)
Sodium: 137 mmol/L (ref 135–145)

## 2015-12-16 LAB — TROPONIN I

## 2015-12-16 MED ORDER — HYDROCHLOROTHIAZIDE 25 MG PO TABS: 25.0000 mg | ORAL_TABLET | Freq: Every day | ORAL | Status: DC

## 2015-12-16 NOTE — Progress Notes (Signed)
Patient given discharge instructions medication list and prescription sent to personal pharmacy, all questions were answered IV and tele dcd, will discharge home as ordered. Jacquita Mulhearn, Bettina Gavia RN

## 2015-12-16 NOTE — Progress Notes (Signed)
   Patient Name: Ronald [name] Date of Encounter: 12/16/2015   SUBJECTIVE  Feeling well. No chest pain, sob or palpitations.   CURRENT MEDS . aspirin EC  81 mg Oral Daily  . enoxaparin (LOVENOX) injection  40 mg Subcutaneous Q24H  . hydrochlorothiazide  25 mg Oral Daily    OBJECTIVE  Filed Vitals:   12/15/15 1703 12/15/15 1807 12/15/15 2044 12/16/15 0613  BP: 134/87 127/71 151/85 113/67  Pulse: 65 66 67 51  Temp: 98.1 F (36.7 C) 97.8 F (36.6 C) 97.4 F (36.3 C) 97.6 F (36.4 C)  TempSrc: Oral Oral Oral Oral  Resp: 18  19 18   Height:      Weight:    189 lb 9.5 oz (86 kg)  SpO2: 100% 100% 99% 99%   No intake or output data in the 24 hours ending 12/16/15 0925 Filed Weights   12/15/15 1457 12/16/15 0613  Weight: 190 lb (86.183 kg) 189 lb 9.5 oz (86 kg)    PHYSICAL EXAM  General: Pleasant, NAD. Neuro: Alert and oriented X 3. Moves all extremities spontaneously. Psych: Normal affect. HEENT:  Normal  Neck: Supple without bruits or JVD. Lungs:  Resp regular and unlabored, CTA. Heart: RRR no s3, s4, or murmurs. Abdomen: Soft, non-tender, non-distended, BS + x 4.  Extremities: No clubbing, cyanosis or edema. DP/PT/Radials 2+ and equal bilaterally.  Accessory Clinical Findings  CBC  Recent Labs  12/15/15 1535  WBC 4.5  NEUTROABS 2.0  HGB 14.8  HCT 41.7  MCV 88.0  PLT [id number]   Basic Metabolic Panel  Recent Labs  12/15/15 1535 12/16/15 0654  NA 137 137  K 3.5 3.5  CL 101 104  CO2 28 25  GLUCOSE 95 93  BUN 16 15  CREATININE 1.25* 1.24  CALCIUM 9.7 9.5   Liver Function Tests No results for input(s): AST, ALT, ALKPHOS, BILITOT, PROT, ALBUMIN in the last 72 hours. No results for input(s): LIPASE, AMYLASE in the last 72 hours. Cardiac Enzymes  Recent Labs  12/15/15 2023 12/16/15 0157 12/16/15 0654  TROPONINI <0.03 <0.03 <0.03    TELE  Sinus rhythm at rate mostly in 50s. PACs  Radiology/Studies  Dg Chest Port 1 View  12/15/2015  CLINICAL  DATA:  Shortness of breath and chest tightness for 3 days EXAM: PORTABLE CHEST 1 VIEW COMPARISON:  November 07, 2015 FINDINGS: Lungs are clear. Heart size and pulmonary vascularity are normal. No adenopathy. Atherosclerotic calcification is noted in the aortic arch. No bone lesions. No pneumothorax. IMPRESSION: Aortic atherosclerosis.  No edema or consolidation. Electronically Signed   By: Lowella Grip III M.D.   On: 12/15/2015 15:53    ASSESSMENT AND PLAN   1. Chest pain  - Atypical. The patient is ruled out. Troponin x 3 negative. EKG unchanged. No further work up needed.   2. HTN - Stable and controlled here. Continue HCTZ 25mg . His EKG suggests hypertensive heart disease. Pending echo. He wants to go home. Advice to ambulate. Discharge later today, pending echo or can be done as outpatient.   Jarrett Soho PA-C Pager [phone]

## 2015-12-16 NOTE — Discharge Summary (Signed)
Discharge Summary    Patient ID: Ronald [name],  MRN: [id number], DOB/AGE: [birth year] 45 y.o.  Admit date: 12/15/2015 Discharge date: 12/16/2015  Primary Care Provider: No PCP Per Patient Primary Cardiologist: New (Dr. Percival Spanish)   Discharge Diagnoses    Active Problems:   Chest pain   Hypertension    Allergies Allergies  Allergen Reactions  . Penicillins Anaphylaxis    Has patient had a PCN reaction causing immediate rash, facial/tongue/throat swelling, SOB or lightheadedness with hypotension: yes Has patient had a PCN reaction causing severe rash involving mucus membranes or skin necrosis: unknown Has patient had a PCN reaction that required hospitalization yes Has patient had a PCN reaction occurring within the last 10 years: no If all of the above answers are "NO", then may proceed with Cephalosporin use. \  . Sulfa Antibiotics Anaphylaxis    Diagnostic Studies/Procedures    Echo 12/16/15 LV EF: 55% - 60%  ------------------------------------------------------------------- Indications: Chest pain 786.51.  ------------------------------------------------------------------- History: Risk factors: Former tobacco use. Hypertension.  ------------------------------------------------------------------- Study Conclusions  - Left ventricle: The cavity size was normal. Wall thickness was  normal. Systolic function was normal. The estimated ejection  fraction was in the range of 55% to 60%. Wall motion was normal;  there were no regional wall motion abnormalities. - Right atrium: The atrium was mildly dilated.  Impressions:  - Normal LV systolic function; probable mild diastolic dysfunction;  mild RAE.   History of Present Illness     The patient has no past cardiac history. He presented 12/16/15 to [facility] with chest pain. He gives a somewhat vague history of having some chest pain while working out. However, he did a have weight  lifting and aerobic work out. He did this on an empty stomach. He is taking multiple supplements. He describes the pain as coming and going. It seems to be mid chest. There was no radiation. It was mild. There were no associated symptoms. He has not had this discomfort before. He can be very physically active without bringing on this discomfort. His pain went away before he came to the ED.He reports that his BP is elevated at home and he says he "just" ran out of his meds.   In the ED, enzymes were negative. EKG showed anterolateral T wave inversion that was unchanged from previous EKGs.   Hospital Course     Consultants: None  The patient was admitted for observation. His blood pressure was controlled during admission and started home HCTZ 25mg  which he was run out. His EKG suggests hypertensive heart disease.His chest pain was atypical. No reoccurrence during admission. The patient was ruled out. Troponin x 3 negative. EKG unchanged. Echo showed normal LV systolic function; probable mild diastolic dysfunction; mild RAE. Ambulated without dyspnea or chest pain   The patient has been seen by Dr. Percival Spanish  today and deemed ready for discharge home. All follow-up appointments have been scheduled. Discharge medications are listed below.   The patient does not have established PCP. Given [facility] script #30 RF 6. He will follow up with cardiology once until he establish care with PCP.   Discharge Vitals Blood pressure 106/37, pulse 55, temperature 97.6 F (36.4 C), temperature source Oral, resp. rate 18, height 5\' 9"  (1.753 m), weight 189 lb 9.5 oz (86 kg), SpO2 99 %.  Filed Weights   12/15/15 1457 12/16/15 0613  Weight: 190 lb (86.183 kg) 189 lb 9.5 oz (86 kg)    Labs & Radiologic  Studies     CBC  Recent Labs  12/15/15 1535  WBC 4.5  NEUTROABS 2.0  HGB 14.8  HCT 41.7  MCV 88.0  PLT [id number]   Basic Metabolic Panel  Recent Labs  12/15/15 1535 12/16/15 0654  NA 137 137    K 3.5 3.5  CL 101 104  CO2 28 25  GLUCOSE 95 93  BUN 16 15  CREATININE 1.25* 1.24  CALCIUM 9.7 9.5   Liver Function Tests No results for input(s): AST, ALT, ALKPHOS, BILITOT, PROT, ALBUMIN in the last 72 hours. No results for input(s): LIPASE, AMYLASE in the last 72 hours. Cardiac Enzymes  Recent Labs  12/15/15 2023 12/16/15 0157 12/16/15 0654  TROPONINI <0.03 <0.03 <0.03   BNP Invalid input(s): POCBNP D-Dimer No results for input(s): DDIMER in the last 72 hours. Hemoglobin A1C No results for input(s): HGBA1C in the last 72 hours. Fasting Lipid Panel No results for input(s): CHOL, HDL, LDLCALC, TRIG, CHOLHDL, LDLDIRECT in the last 72 hours. Thyroid Function Tests No results for input(s): TSH, T4TOTAL, T3FREE, THYROIDAB in the last 72 hours.  Invalid input(s): FREET3  Dg Chest Port 1 View  12/15/2015  CLINICAL DATA:  Shortness of breath and chest tightness for 3 days EXAM: PORTABLE CHEST 1 VIEW COMPARISON:  November 07, 2015 FINDINGS: Lungs are clear. Heart size and pulmonary vascularity are normal. No adenopathy. Atherosclerotic calcification is noted in the aortic arch. No bone lesions. No pneumothorax. IMPRESSION: Aortic atherosclerosis.  No edema or consolidation. Electronically Signed   By: Lowella Grip III M.D.   On: 12/15/2015 15:53    Disposition   Pt is being discharged home today in good condition.  Follow-up Plans & Appointments    Follow-up Information    Follow up with Minus Breeding, MD. Go on 01/20/2016.   Specialty:  Cardiology   Why:  @8 :00 am for post    Contact information:   [address] [place] [address] [phone]      Discharge Instructions    Diet - low sodium heart healthy    Complete by:  As directed      Increase activity slowly    Complete by:  As directed            Discharge Medications   Discharge Medication List as of 12/16/2015 12:21 PM    START taking these medications   Details   hydrochlorothiazide (HYDRODIURIL) 25 MG tablet Take 1 tablet (25 mg total) by mouth daily. Need to establish care with PCP for further refill, Starting 12/16/2015, Until Discontinued, Normal      STOP taking these medications     LORazepam (ATIVAN) 1 MG tablet          Outstanding Labs/Studies   None  Duration of Discharge Encounter   Greater than 30 minutes including physician time.  Signed, Bhagat,Bhavinkumar PA-C 12/16/2015, 12:38 PM

## 2015-12-16 NOTE — Progress Notes (Signed)
Echocardiogram 2D Echocardiogram has been performed.  Tresa Res 12/16/2015, 10:32 AM

## 2016-01-17 ENCOUNTER — Emergency Department (HOSPITAL_BASED_OUTPATIENT_CLINIC_OR_DEPARTMENT_OTHER)
Admission: EM | Admit: 2016-01-17 | Discharge: 2016-01-17 | Disposition: A | Discharge status: Home / Self Care | Payer: Self-pay | Attending: Emergency Medicine | Admitting: Emergency Medicine

## 2016-01-17 ENCOUNTER — Encounter (HOSPITAL_BASED_OUTPATIENT_CLINIC_OR_DEPARTMENT_OTHER): Payer: Self-pay

## 2016-01-17 DIAGNOSIS — I10 Essential (primary) hypertension: Secondary | ICD-10-CM | POA: Insufficient documentation

## 2016-01-17 DIAGNOSIS — Z87891 Personal history of nicotine dependence: Secondary | ICD-10-CM | POA: Insufficient documentation

## 2016-01-17 DIAGNOSIS — Z0000 Encounter for general adult medical examination without abnormal findings: Secondary | ICD-10-CM | POA: Insufficient documentation

## 2016-01-17 DIAGNOSIS — Z139 Encounter for screening, unspecified: Secondary | ICD-10-CM

## 2016-01-17 NOTE — ED Notes (Signed)
Pt has no deformities to ankle, has full rom, cms intact at this time.  Pt is wanting a work note from ankle injury last year to go back to former employer.

## 2016-01-17 NOTE — ED Triage Notes (Signed)
Patient presents to ED in need for work note stating his ankle is normal after a sprain that occurred one year ago. Patient denies any pain at this time.

## 2016-01-17 NOTE — ED Notes (Signed)
Pt made aware to return if symptoms worsen or if any life threatening symptoms occur.   

## 2016-01-17 NOTE — ED Provider Notes (Signed)
[facility]PT [facility] Provider Note   CSN: [id number] Arrival date & time: 01/17/16  1318     History   Chief Complaint Chief Complaint  Patient presents with  . Letter for School/Work    HPI Ronald [name] is a 45 y.o. male.  Patient presents to the emergency department for medical screening exam to return to work. 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 States that he has now come back to work, but his employer still has him listed as being on restrictions from his ankle sprain 1 year ago. He asks for a note clearing him to return to work. He denies any ankle pain now. States that he runs every single day, and is very active without any pain. He has no complaints.   The history is provided by the patient. No language interpreter was used.    Past Medical History:  Diagnosis Date  . Hypertension     Patient Active Problem List   Diagnosis Date Noted  . Chest pain 12/15/2015  . Lower leg injury 01/19/2015    Past Surgical History:  Procedure Laterality Date  . arm surgery     torn bicept    OB History    No data available       Home Medications    Prior to Admission medications   Medication Sig Start Date End Date Taking? Authorizing Provider  hydrochlorothiazide (HYDRODIURIL) 25 MG tablet Take 1 tablet (25 mg total) by mouth daily. Need to establish care with PCP for further refill 12/16/15   Leanor Kail, PA    Family History Family History  Problem Relation Age of Onset  . Hypertension Mother   . Hypertension Father   . Cancer Other     Social History Social History  Substance Use Topics  . Smoking status: Former Smoker    Packs/day: 0.00    Types: Cigarettes    Quit date: 05/11/2015  . Smokeless tobacco: Not on file  . Alcohol use No     Allergies   Penicillins and Sulfa antibiotics   Review of Systems Review of  Systems  All other systems reviewed and are negative.    Physical Exam Updated Vital Signs BP 139/84 (BP Location: Left Arm)   Pulse 72   Temp 98.1 F (36.7 C) (Oral)   Resp 17   Ht 5\' 9"  (1.753 m)   Wt 86.2 kg   SpO2 98%   BMI 28.06 kg/m   Physical Exam  Constitutional: He is oriented to person, place, and time. He appears well-developed and well-nourished.  HENT:  Head: Normocephalic and atraumatic.  Eyes: Conjunctivae and EOM are normal.  Neck: Normal range of motion.  Cardiovascular: Normal rate.   Pulmonary/Chest: Effort normal.  Abdominal: He exhibits no distension.  Musculoskeletal: Normal range of motion.  Normal range of motion and strength throughout extremities, ambulates without difficulty  Neurological: He is alert and oriented to person, place, and time.  Skin: Skin is dry.  Psychiatric: He has a normal mood and affect. His behavior is normal. Judgment and thought content normal.  Nursing note and vitals reviewed.    ED Treatments / Results  Labs (all labs ordered are listed, but only abnormal results are displayed) Labs Reviewed - No data to display  EKG  EKG Interpretation None       Radiology No results found.  Procedures Procedures (  including critical care time)  Medications Ordered in ED Medications - No data to display   Initial Impression / Assessment and Plan / ED Course  I have reviewed the triage vital signs and the nursing notes.  Pertinent labs & imaging results that were available during my care of the patient were reviewed by me and considered in my medical decision making (see chart for details).  Clinical Course    No abnormality about the ankle, ambulates without difficulty. Clear to return to work.  Final Clinical Impressions(s) / ED Diagnoses   Final diagnoses:  Encounter for medical screening examination    New Prescriptions New Prescriptions   No medications on file     Montine Circle, PA-C 01/17/16  [place], MD 01/18/16 [phone]

## 2016-01-18 NOTE — Progress Notes (Deleted)
Cardiology Office Note   Date:  01/18/2016   ID:  Ronald [name], DOB [birth year], MRN [id number]  PCP:  No PCP Per Patient  Cardiologist:   Minus Breeding, MD   No chief complaint on file.     History of Present Illness: Ronald [name] is a 45 y.o. male who presents for follow up after a recent observation visit for evaluation of chest pain.  He ruled out.  Of note his echo was normal except for some mild LVH.  ***  During that hospitalization he had elevated BPs.      Past Medical History:  Diagnosis Date  . Hypertension     Past Surgical History:  Procedure Laterality Date  . arm surgery     torn bicept     Current Outpatient Prescriptions  Medication Sig Dispense Refill  . hydrochlorothiazide (HYDRODIURIL) 25 MG tablet Take 1 tablet (25 mg total) by mouth daily. Need to establish care with PCP for further refill 30 tablet 6   No current facility-administered medications for this visit.     Allergies:   Penicillins and Sulfa antibiotics    ROS:  Please see the history of present illness.   Otherwise, review of systems are positive for {NONE DEFAULTED:18576::"none"}.   All other systems are reviewed and negative.    PHYSICAL EXAM: VS:  There were no vitals taken for this visit. , BMI There is no height or weight on file to calculate BMI. GENERAL:  Well appearing HEENT:  Pupils equal round and reactive, fundi not visualized, oral mucosa unremarkable NECK:  No jugular venous distention, waveform within normal limits, carotid upstroke brisk and symmetric, no bruits, no thyromegaly LYMPHATICS:  No cervical, inguinal adenopathy LUNGS:  Clear to auscultation bilaterally BACK:  No CVA tenderness CHEST:  Unremarkable HEART:  PMI not displaced or sustained,S1 and S2 within normal limits, no S3, no S4, no clicks, no rubs, *** murmurs ABD:  Flat, positive bowel sounds normal in frequency in pitch, no bruits, no rebound, no guarding, no midline pulsatile mass, no hepatomegaly,  no splenomegaly EXT:  2 plus pulses throughout, no edema, no cyanosis no clubbing SKIN:  No rashes no nodules NEURO:  Cranial nerves II through XII grossly intact, motor grossly intact throughout PSYCH:  Cognitively intact, oriented to person place and time    EKG:  EKG {ACTION; IS/IS [id number] ordered today. The ekg ordered today demonstrates ***   Recent Labs: 07/26/2015: ALT 26 12/15/2015: Hemoglobin 14.8; Platelets 250 12/16/2015: BUN 15; Creatinine, Ser 1.24; Potassium 3.5; Sodium 137    Lipid Panel No results found for: CHOL, TRIG, HDL, CHOLHDL, VLDL, LDLCALC, LDLDIRECT    Wt Readings from Last 3 Encounters:  01/17/16 190 lb (86.2 kg)  12/16/15 189 lb 9.5 oz (86 kg)  11/07/15 191 lb (86.6 kg)      Other studies Reviewed: Additional studies/ records that were reviewed today include: ***. Review of the above records demonstrates:  Please see elsewhere in the note.  ***   ASSESSMENT AND PLAN:  CHEST PAIN:  HTN:    Current medicines are reviewed at length with the patient today.  The patient {ACTIONS; HAS/DOES NOT HAVE:19233} concerns regarding medicines.  The following changes have been made:  {PLAN; NO CHANGE:13088:s}  Labs/ tests ordered today include: *** No orders of the defined types were placed in this encounter.    Disposition:   FU with ***    Signed, Minus Breeding, MD  01/18/2016 2:40 PM    [facility]

## 2016-01-20 ENCOUNTER — Ambulatory Visit: Payer: Self-pay | Admitting: Cardiology

## 2016-01-23 ENCOUNTER — Encounter: Payer: Self-pay | Admitting: *Deleted

## 2016-01-23 NOTE — Progress Notes (Signed)
Letter mailed

## 2016-02-08 ENCOUNTER — Encounter (HOSPITAL_BASED_OUTPATIENT_CLINIC_OR_DEPARTMENT_OTHER): Payer: Self-pay | Admitting: Emergency Medicine

## 2016-02-08 ENCOUNTER — Emergency Department (HOSPITAL_BASED_OUTPATIENT_CLINIC_OR_DEPARTMENT_OTHER)
Admission: EM | Admit: 2016-02-08 | Discharge: 2016-02-08 | Disposition: A | Discharge status: Home / Self Care | Payer: Self-pay | Attending: Emergency Medicine | Admitting: Emergency Medicine

## 2016-02-08 DIAGNOSIS — Z87891 Personal history of nicotine dependence: Secondary | ICD-10-CM | POA: Insufficient documentation

## 2016-02-08 DIAGNOSIS — I10 Essential (primary) hypertension: Secondary | ICD-10-CM | POA: Insufficient documentation

## 2016-02-08 DIAGNOSIS — J029 Acute pharyngitis, unspecified: Secondary | ICD-10-CM | POA: Insufficient documentation

## 2016-02-08 LAB — RAPID STREP SCREEN (MED CTR MEBANE ONLY): STREPTOCOCCUS, GROUP A SCREEN (DIRECT): NEGATIVE

## 2016-02-08 MED ORDER — IBUPROFEN 800 MG PO TABS
800.0000 mg | ORAL_TABLET | Freq: Once | ORAL | Status: AC
  Administered 2016-02-08: 800 mg via ORAL
  Filled 2016-02-08: qty 1

## 2016-02-08 MED ORDER — ACETAMINOPHEN 325 MG PO TABS
650.0000 mg | ORAL_TABLET | Freq: Once | ORAL | Status: AC | PRN
  Administered 2016-02-08: 650 mg via ORAL
  Filled 2016-02-08: qty 2

## 2016-02-08 MED ORDER — HYDROCHLOROTHIAZIDE 25 MG PO TABS: 25.0000 mg | ORAL_TABLET | Freq: Every day | ORAL | 0 refills | Status: DC

## 2016-02-08 MED FILL — HYDROCHLOROTHIAZIDE 25 MG T: 25 | 30 days supply | Qty: 30 | Fill #0

## 2016-02-08 NOTE — ED Triage Notes (Signed)
Sore throat x 2 days, body aches. Son tx for strep. He states the dr gave him keflex to take and he has taken 2 doses but if feeling worse.

## 2016-02-08 NOTE — ED Provider Notes (Addendum)
[facility] Provider Note   CSN: [id number] Arrival date & time: 02/08/16  1022     History   Chief Complaint Chief Complaint  Patient presents with  . Sore Throat    HPI Dalonta Biernacki is a 45 y.o. male.  HPI  45 year old male presenting with sore throat and fever. Patient's son was diagnosed with strep 2 nights ago. At that time he states he is feeling to mild scratchiness in his throat and nose he was prescribed Keflex that night. Felt the same yesterday and took his Keflex yesterday. This morning he has felt worse with nausea, aches, sore throat and painful swallowing. No trouble breathing. No headaches. Had a cough this morning is now gone. Started having hot and cold chills and developed a fever.  Past Medical History:  Diagnosis Date  . Hypertension     Patient Active Problem List   Diagnosis Date Noted  . Chest pain 12/15/2015  . Lower leg injury 01/19/2015    Past Surgical History:  Procedure Laterality Date  . arm surgery     torn bicept    OB History    No data available       Home Medications    Prior to Admission medications   Medication Sig Start Date End Date Taking? Authorizing Provider  hydrochlorothiazide (HYDRODIURIL) 25 MG tablet Take 1 tablet (25 mg total) by mouth daily. Need to establish care with PCP for further refill 12/16/15  Yes Leanor Kail, PA    Family History Family History  Problem Relation Age of Onset  . Hypertension Mother   . Hypertension Father   . Cancer Other     Social History Social History  Substance Use Topics  . Smoking status: Former Smoker    Packs/day: 0.00    Types: Cigarettes    Quit date: 05/11/2015  . Smokeless tobacco: Never Used  . Alcohol use No     Allergies   Penicillins and Sulfa antibiotics   Review of Systems Review of Systems  Constitutional: Positive for chills and fever.  HENT: Positive for ear pain and sore throat. Negative for voice change.   Respiratory:  Positive for cough. Negative for shortness of breath.   Gastrointestinal: Positive for nausea.  Musculoskeletal: Positive for arthralgias.  All other systems reviewed and are negative.    Physical Exam Updated Vital Signs BP 128/74 (BP Location: Right Arm)   Pulse 84   Temp 101.1 F (38.4 C) (Oral)   Resp 20   Ht 5\' 9"  (1.753 m)   Wt 189 lb (85.7 kg)   SpO2 95%   BMI 27.91 kg/m   Physical Exam  Constitutional: He is oriented to person, place, and time. He appears well-developed and well-nourished.  HENT:  Head: Normocephalic and atraumatic.  Right Ear: Tympanic membrane and external ear normal.  Left Ear: Tympanic membrane and external ear normal.  Nose: Nose normal.  Mouth/Throat: Mucous membranes are not dry. No trismus in the jaw. No uvula swelling. Posterior oropharyngeal erythema (mild) present. No oropharyngeal exudate or tonsillar abscesses. No tonsillar exudate.  Eyes: Right eye exhibits no discharge. Left eye exhibits no discharge.  Neck: Full passive range of motion without pain. Neck supple. No muscular tenderness present. No neck rigidity.  Cardiovascular: Normal rate, regular rhythm and normal heart sounds.   Pulmonary/Chest: Effort normal and breath sounds normal.  Abdominal: Soft. There is no tenderness.  Musculoskeletal: He exhibits no edema.  Neurological: He is alert and oriented to person, place, and  time.  Skin: Skin is warm and dry. He is not diaphoretic.  Nursing note and vitals reviewed.    ED Treatments / Results  Labs (all labs ordered are listed, but only abnormal results are displayed) Labs Reviewed  RAPID STREP SCREEN (NOT AT [facility])  CULTURE, GROUP A STREP [facility])    EKG  EKG Interpretation None       Radiology No results found.  Procedures Procedures (including critical care time)  Medications Ordered in ED Medications  acetaminophen (TYLENOL) tablet 650 mg (650 mg Oral Given 02/08/16 1042)     Initial Impression /  Assessment and Plan / ED Course  I have reviewed the triage vital signs and the nursing notes.  Pertinent labs & imaging results that were available during my care of the patient were reviewed by me and considered in my medical decision making (see chart for details).  Clinical Course    Patient appears to have a viral illness. Given his strep is negative, I would not continue antibiotics or change antibiotics. His oropharyngeal and overall exam is unimpressive. Highly doubt pneumonia. Discussed return precautions as well as symptomatically at home.  Patient also asking for a refill of his BP meds.  Final Clinical Impressions(s) / ED Diagnoses   Final diagnoses:  Viral pharyngitis    New Prescriptions New Prescriptions   No medications on file     Sherwood Gambler, MD 02/08/16 [place], MD 02/08/16 1214

## 2016-02-08 NOTE — ED Notes (Signed)
MD at bedside. 

## 2016-02-10 ENCOUNTER — Encounter (HOSPITAL_BASED_OUTPATIENT_CLINIC_OR_DEPARTMENT_OTHER): Payer: Self-pay | Admitting: Emergency Medicine

## 2016-02-10 ENCOUNTER — Emergency Department (HOSPITAL_BASED_OUTPATIENT_CLINIC_OR_DEPARTMENT_OTHER)
Admission: EM | Admit: 2016-02-10 | Discharge: 2016-02-10 | Disposition: A | Discharge status: Home / Self Care | Payer: Self-pay | Attending: Emergency Medicine | Admitting: Emergency Medicine

## 2016-02-10 ENCOUNTER — Emergency Department (HOSPITAL_BASED_OUTPATIENT_CLINIC_OR_DEPARTMENT_OTHER): Payer: Self-pay

## 2016-02-10 DIAGNOSIS — I10 Essential (primary) hypertension: Secondary | ICD-10-CM | POA: Insufficient documentation

## 2016-02-10 DIAGNOSIS — J069 Acute upper respiratory infection, unspecified: Secondary | ICD-10-CM | POA: Insufficient documentation

## 2016-02-10 DIAGNOSIS — Z79899 Other long term (current) drug therapy: Secondary | ICD-10-CM | POA: Insufficient documentation

## 2016-02-10 DIAGNOSIS — Z87891 Personal history of nicotine dependence: Secondary | ICD-10-CM | POA: Insufficient documentation

## 2016-02-10 LAB — CULTURE, GROUP A STREP (THRC)

## 2016-02-10 NOTE — ED Triage Notes (Addendum)
Patient reports sore throat, states it feels "bumpy".  States coughing up "bloody phlegm", reports fever, chills.  States he has had antibiotics because son was diagnosed with strep throat and his doctor treated the family for strep.  Reports he has been taking cephalexin x 2 days.

## 2016-02-10 NOTE — ED Provider Notes (Signed)
[facility] Provider Note   CSN: [id number] Arrival date & time: 02/10/16  0825     History   Chief Complaint Chief Complaint  Patient presents with  . Sore Throat    HPI Ronald [name] is a 45 y.o. male with a past medical history of hypertension and recent exposure to strep throat currently on Keflex who presents with worsening cough. Patient reports that for the last several days he has had sore throat, chills, fever yesterday, and worsening cough. Patient reports that his cough today his become productive of a yellowish sputum. Patient denies any chest pain, shortness of breath, nausea, vomiting, conservation, diarrhea or dysuria. Patient denies any other complaints. Patient denies any voice change but does report pain with swallowing. Patient denies any neck pain or neck rigidity. Patient denies any neurological complaints.   The history is provided by the patient and medical records. No language interpreter was used.  Sore Throat  This is a new problem. The current episode started more than 2 days ago. The problem occurs constantly. The problem has not changed since onset.Pertinent negatives include no chest pain, no abdominal pain, no headaches and no shortness of breath. The symptoms are aggravated by swallowing. Nothing relieves the symptoms. The treatment provided no relief.  Cough  This is a new problem. The current episode started 2 days ago. The problem occurs constantly. The problem has not changed since onset.The cough is productive of sputum. The maximum temperature recorded prior to his arrival was 103 to 104 F (per pt, had fever of 103 yesterday. ). Associated symptoms include chills and sore throat. Pertinent negatives include no chest pain, no headaches, no shortness of breath and no wheezing. Treatments tried: tried keflex from pcp. His past medical history is significant for bronchitis.    Past Medical History:  Diagnosis Date  . Hypertension     Patient  Active Problem List   Diagnosis Date Noted  . Chest pain 12/15/2015  . Lower leg injury 01/19/2015    Past Surgical History:  Procedure Laterality Date  . arm surgery     torn bicept    OB History    No data available       Home Medications    Prior to Admission medications   Medication Sig Start Date End Date Taking? Authorizing Provider  Cephalexin (KEFLEX PO) Take by mouth.   Yes Historical Provider, MD  hydrochlorothiazide (HYDRODIURIL) 25 MG tablet Take 1 tablet (25 mg total) by mouth daily. Need to establish care with PCP for further refill 02/08/16   Sherwood Gambler, MD    Family History Family History  Problem Relation Age of Onset  . Hypertension Mother   . Hypertension Father   . Cancer Other     Social History Social History  Substance Use Topics  . Smoking status: Former Smoker    Packs/day: 0.00    Types: Cigarettes    Quit date: 05/11/2015  . Smokeless tobacco: Never Used  . Alcohol use No     Allergies   Penicillins and Sulfa antibiotics   Review of Systems Review of Systems  Constitutional: Positive for chills, diaphoresis and fever. Negative for fatigue.  HENT: Positive for congestion and sore throat. Negative for facial swelling, trouble swallowing and voice change.   Respiratory: Positive for cough. Negative for choking, chest tightness, shortness of breath, wheezing and stridor.   Cardiovascular: Negative for chest pain.  Gastrointestinal: Negative for abdominal pain, constipation, diarrhea, nausea and vomiting.  Genitourinary: Negative  for dysuria and flank pain.  Musculoskeletal: Negative for back pain, neck pain and neck stiffness.  Skin: Negative for wound.  Neurological: Negative for weakness, light-headedness and headaches.  Psychiatric/Behavioral: Negative for agitation and confusion.  All other systems reviewed and are negative.    Physical Exam Updated Vital Signs BP 155/92 (BP Location: Right Arm)   Pulse 64   Temp 98.1  F (36.7 C) (Oral)   Ht 5\' 9"  (1.753 m)   Wt 189 lb (85.7 kg)   SpO2 99%   BMI 27.91 kg/m   Physical Exam  Constitutional: He appears well-developed and well-nourished.  HENT:  Head: Normocephalic and atraumatic.  Mouth/Throat: Posterior oropharyngeal erythema present.  Eyes: Conjunctivae and EOM are normal. Pupils are equal, round, and reactive to light.  Neck: Normal range of motion. Neck supple.  Cardiovascular: Normal rate, regular rhythm and normal heart sounds.   No murmur heard. Pulmonary/Chest: Effort normal and breath sounds normal. No stridor. No respiratory distress. He has no wheezes. He has no rales. He exhibits no tenderness.  Abdominal: Soft. There is no tenderness.  Musculoskeletal: He exhibits no edema or tenderness.  Lymphadenopathy:    He has no cervical adenopathy.  Neurological: He is alert. He exhibits normal muscle tone.  Skin: Skin is warm and dry.  Psychiatric: He has a normal mood and affect. His behavior is normal.  Nursing note and vitals reviewed.    ED Treatments / Results  Labs (all labs ordered are listed, but only abnormal results are displayed) Labs Reviewed - No data to display  EKG  EKG Interpretation None       Radiology Dg Chest 1 View  Result Date: 02/10/2016 CLINICAL DATA:  Cough, nodule versus nipple shadow EXAM: CHEST 1 VIEW COMPARISON:  02/10/2016 FINDINGS: The heart size and mediastinal contours are within normal limits. Both lungs are clear. The visualized skeletal structures are unremarkable. IMPRESSION: No active disease. Electronically Signed   By: Kathreen Devoid   On: 02/10/2016 09:44   Dg Chest 2 View  Result Date: 02/10/2016 CLINICAL DATA:  Coughing congestion.  Fever and chills. EXAM: CHEST  2 VIEW COMPARISON:  12/15/2015 FINDINGS: Lungs are hyperexpanded. Right lung is clear. 1 cm nodular density projects over the left lung base. The cardiopericardial silhouette is within normal limits for size. The visualized bony  structures of the thorax are intact. IMPRESSION: 1 cm nodular density overlying left lung base may represent a nipple shadow. Repeat frontal radiograph with nipple markers recommended to confirm. Electronically Signed   By: Misty Stanley M.D.   On: 02/10/2016 09:11    Procedures Procedures (including critical care time)  Medications Ordered in ED Medications - No data to display   Initial Impression / Assessment and Plan / ED Course  I have reviewed the triage vital signs and the nursing notes.  Pertinent labs & imaging results that were available during my care of the patient were reviewed by me and considered in my medical decision making (see chart for details).  Clinical Course    Jayzon Vroom is a 45 y.o. male who is currently being treated for strep throat who presents for congestion, sore throat, and cough. On review of patient's chart, strep testing and culture are negative from several days ago. On exam, patient had some erythema in his posterior oropharynx however, lungs were clear. Patient did not have significant lymphadenopathy and had full range of motion of the neck. Do not feel there is a retropharyngeal abscess, PTA, or meningitis.  Given patient's productive cough, reported fever of 103 yesterday, his incision was made to obtain chest x-ray to rule out pneumonia. Initial x-rays showed possible nodule versus nipple shadow however, repeat imaging showed lungs that were clear with no abnormality.  Patient was reassured that there did not appear to be pneumonia and his symptoms are likely secondary to a viral URI. Patient was informed of the culture results showing no strep throat. Given patient's reassuring exam and history, do not feel laboratory testing is needed at this time.  Patient understood conservative management strategies of fluids and rest and that it might take several more days to clear his likely viral URI. Patient understood return precautions for new or worsening  symptoms. Patient had no other questions or concerns and patient discharged in good condition with instructions to follow-up with PCP.    Final Clinical Impressions(s) / ED Diagnoses   Final diagnoses:  URI (upper respiratory infection)    New Prescriptions Discharge Medication List as of 02/10/2016  9:54 AM      Clinical Impression: 1. URI (upper respiratory infection)     Disposition: Discharge  Condition: Good  I have discussed the results, Dx and Tx plan with the pt(& family if present). He/she/they expressed understanding and agree(s) with the plan. Discharge instructions discussed at great length. Strict return precautions discussed and pt &/or family have verbalized understanding of the instructions. No further questions at time of discharge.    Discharge Medication List as of 02/10/2016  9:54 AM      Follow Up: [facility] [place] [phone] Schedule an appointment as soon as possible for a visit  If symptoms worsen, please return to the nearest ED.     Gwenyth Allegra Bonne Whack, MD 02/10/16 1537

## 2016-02-13 ENCOUNTER — Emergency Department (HOSPITAL_BASED_OUTPATIENT_CLINIC_OR_DEPARTMENT_OTHER): Payer: Self-pay

## 2016-02-13 ENCOUNTER — Emergency Department (HOSPITAL_BASED_OUTPATIENT_CLINIC_OR_DEPARTMENT_OTHER)
Admission: EM | Admit: 2016-02-13 | Discharge: 2016-02-13 | Disposition: A | Discharge status: Home / Self Care | Payer: Self-pay | Attending: Emergency Medicine | Admitting: Emergency Medicine

## 2016-02-13 ENCOUNTER — Encounter (HOSPITAL_BASED_OUTPATIENT_CLINIC_OR_DEPARTMENT_OTHER): Payer: Self-pay | Admitting: Emergency Medicine

## 2016-02-13 DIAGNOSIS — I10 Essential (primary) hypertension: Secondary | ICD-10-CM | POA: Insufficient documentation

## 2016-02-13 DIAGNOSIS — J069 Acute upper respiratory infection, unspecified: Secondary | ICD-10-CM | POA: Insufficient documentation

## 2016-02-13 DIAGNOSIS — B9789 Other viral agents as the cause of diseases classified elsewhere: Secondary | ICD-10-CM

## 2016-02-13 DIAGNOSIS — Z87891 Personal history of nicotine dependence: Secondary | ICD-10-CM | POA: Insufficient documentation

## 2016-02-13 MED ORDER — BENZONATATE 100 MG PO CAPS
100.0000 mg | ORAL_CAPSULE | Freq: Once | ORAL | Status: AC
  Administered 2016-02-13: 100 mg via ORAL
  Filled 2016-02-13: qty 1

## 2016-02-13 MED ORDER — ALBUTEROL SULFATE HFA 108 (90 BASE) MCG/ACT IN AERS
2.0000 | INHALATION_SPRAY | Freq: Once | RESPIRATORY_TRACT | Status: AC
Start: 2016-02-13 — End: 2016-02-13
  Administered 2016-02-13: 2 via RESPIRATORY_TRACT
  Filled 2016-02-13: qty 6.7

## 2016-02-13 MED ORDER — GUAIFENESIN 100 MG/5ML PO SYRP: 100.0000 mg | ORAL_SOLUTION | ORAL | 0 refills | Status: DC | PRN

## 2016-02-13 MED ORDER — BENZONATATE 100 MG PO CAPS: 100.0000 mg | ORAL_CAPSULE | Freq: Three times a day (TID) | ORAL | 0 refills | Status: DC | PRN

## 2016-02-13 MED FILL — ROBAFEN 100 MG/5 ML SYRUP: 100 | 2 days supply | Qty: 118 | Fill #0

## 2016-02-13 MED FILL — BENZONATATE 100 MG CAPSULE: 100 | 7 days supply | Qty: 21 | Fill #0

## 2016-02-13 NOTE — ED Notes (Signed)
Pt made aware to return if symptoms worsen or if any life threatening symptoms occur.   

## 2016-02-13 NOTE — ED Provider Notes (Signed)
[facility] Provider Note   CSN: [id number] Arrival date & time: 02/13/16  [id number]     History   Chief Complaint Chief Complaint  Patient presents with  . Cough    HPI Ronald [name] is a 45 y.o. male.  HPI  45 year old male with a cough for 1 week. He was seen here about one week ago after his son tested positive for strep. When I saw him then he was are done Keflex and his strep screen was negative. He states after he left therapy started to open a cough with brown sputum. Fevers he thinks have gone but on and off he feels hot and diaphoretic. He is having shortness of breath, bilateral lower rib pain, and anterior and posterior neck pain whenever he coughs. His throat is still hurting. He was seen here again 3 days ago and had a negative x-ray. He states that every year once per year he gets bronchitis. He states this feels similar. Usually he is given [employer] and his cough gets better. He is still on the Keflex with no change in symptoms. He has not tried any cough suppressants.  Past Medical History:  Diagnosis Date  . Hypertension     Patient Active Problem List   Diagnosis Date Noted  . Chest pain 12/15/2015  . Lower leg injury 01/19/2015    Past Surgical History:  Procedure Laterality Date  . arm surgery     torn bicept    OB History    No data available       Home Medications    Prior to Admission medications   Medication Sig Start Date End Date Taking? Authorizing Provider  benzonatate (TESSALON) 100 MG capsule Take 1 capsule (100 mg total) by mouth 3 (three) times daily as needed for cough. 02/13/16   Sherwood Gambler, MD  Cephalexin (KEFLEX PO) Take by mouth.    Historical Provider, MD  guaifenesin (ROBITUSSIN) 100 MG/5ML syrup Take 5-10 mLs (100-200 mg total) by mouth every 4 (four) hours as needed for cough. 02/13/16   Sherwood Gambler, MD  hydrochlorothiazide (HYDRODIURIL) 25 MG tablet Take 1 tablet (25 mg total) by mouth daily. Need to  establish care with PCP for further refill 02/08/16   Sherwood Gambler, MD    Family History Family History  Problem Relation Age of Onset  . Hypertension Mother   . Hypertension Father   . Cancer Other     Social History Social History  Substance Use Topics  . Smoking status: Former Smoker    Packs/day: 0.00    Types: Cigarettes    Quit date: 05/11/2015  . Smokeless tobacco: Never Used  . Alcohol use No     Allergies   Penicillins and Sulfa antibiotics   Review of Systems Review of Systems  Constitutional: Positive for diaphoresis and fever.  HENT: Positive for congestion and sore throat.   Respiratory: Positive for cough and shortness of breath.   Cardiovascular: Positive for chest pain.  Gastrointestinal: Negative for vomiting.  Musculoskeletal: Positive for neck pain.  All other systems reviewed and are negative.    Physical Exam Updated Vital Signs BP 123/81 (BP Location: Left Arm)   Pulse 78   Temp 97.8 F (36.6 C) (Oral)   Resp 18   Ht 5\' 9"  (1.753 m)   Wt 189 lb (85.7 kg)   SpO2 98%   BMI 27.91 kg/m   Physical Exam  Constitutional: He is oriented to person, place, and time. He appears well-developed  and well-nourished.  HENT:  Head: Normocephalic and atraumatic.  Right Ear: External ear normal.  Left Ear: External ear normal.  Nose: Nose normal.  Mouth/Throat: Oropharynx is clear and moist. No oropharyngeal exudate.  Eyes: Right eye exhibits no discharge. Left eye exhibits no discharge.  Neck: Normal range of motion. Neck supple.  No neck swelling or tenderness on exam  Cardiovascular: Normal rate, regular rhythm and normal heart sounds.   Pulmonary/Chest: Effort normal. No accessory muscle usage. No tachypnea. No respiratory distress. He has wheezes (faint, expiratory) in the right lower field and the left upper field. He exhibits tenderness (mild).    Abdominal: Soft. There is no tenderness.  Musculoskeletal: He exhibits no edema.    Neurological: He is alert and oriented to person, place, and time.  Skin: Skin is warm and dry.  Nursing note and vitals reviewed.    ED Treatments / Results  Labs (all labs ordered are listed, but only abnormal results are displayed) Labs Reviewed - No data to display  EKG  EKG Interpretation None       Radiology Dg Chest 2 View  Result Date: 02/13/2016 CLINICAL DATA:  Cough, fever and congestion for 2 weeks. EXAM: CHEST  2 VIEW COMPARISON:  PA and lateral chest 02/10/2016 and 05/18/2015. FINDINGS: The lungs are clear. Heart size is normal. There is no pneumothorax or pleural effusion. No focal bony abnormality. IMPRESSION: Negative chest. Electronically Signed   By: Inge Rise M.D.   On: 02/13/2016 07:55    Procedures Procedures (including critical care time)  Medications Ordered in ED Medications  albuterol (PROVENTIL HFA;VENTOLIN HFA) 108 (90 Base) MCG/ACT inhaler 2 puff (2 puffs Inhalation Given 02/13/16 0742)  benzonatate (TESSALON) capsule 100 mg (100 mg Oral Given 02/13/16 0740)     Initial Impression / Assessment and Plan / ED Course  I have reviewed the triage vital signs and the nursing notes.  Pertinent labs & imaging results that were available during my care of the patient were reviewed by me and considered in my medical decision making (see chart for details).  Clinical Course  Comment By Time  Will give albuterol inhaler here and to take home. He is requesting tessalon which has worked in past. Will repeat CXR in case of false negative or missed PNA a few days ago. Overall appears well. Sherwood Gambler, MD 09/18 0732  X-ray is negative again. My suspicion is still that this is a viral process. He is afebrile and overall well appearing. Plan to treat with cough suppressants. I don't think further antibiotics would be beneficial. Sherwood Gambler, MD 09/18 [phone]    Final Clinical Impressions(s) / ED Diagnoses   Final diagnoses:  Viral URI with cough     New Prescriptions New Prescriptions   BENZONATATE (TESSALON) 100 MG CAPSULE    Take 1 capsule (100 mg total) by mouth 3 (three) times daily as needed for cough.   GUAIFENESIN (ROBITUSSIN) 100 MG/5ML SYRUP    Take 5-10 mLs (100-200 mg total) by mouth every 4 (four) hours as needed for cough.     Sherwood Gambler, MD 02/13/16 [phone]

## 2016-02-13 NOTE — ED Triage Notes (Signed)
Pt reports being seen for this same issue x2.  Pt for two weeks has had cough with brown/bloody sputum.  Pt having rib pain, posterior neck pain, throat pain.  Pt alert and oriented, speaking in complete sentences, in no acute distress at this time.

## 2016-02-14 ENCOUNTER — Emergency Department (HOSPITAL_COMMUNITY)
Admission: EM | Admit: 2016-02-14 | Discharge: 2016-02-14 | Disposition: A | Discharge status: Home / Self Care | Payer: Self-pay | Attending: Emergency Medicine | Admitting: Emergency Medicine

## 2016-02-14 ENCOUNTER — Encounter (HOSPITAL_COMMUNITY): Payer: Self-pay | Admitting: Emergency Medicine

## 2016-02-14 DIAGNOSIS — H109 Unspecified conjunctivitis: Secondary | ICD-10-CM | POA: Insufficient documentation

## 2016-02-14 DIAGNOSIS — Z79899 Other long term (current) drug therapy: Secondary | ICD-10-CM | POA: Insufficient documentation

## 2016-02-14 DIAGNOSIS — Z87891 Personal history of nicotine dependence: Secondary | ICD-10-CM | POA: Insufficient documentation

## 2016-02-14 DIAGNOSIS — I10 Essential (primary) hypertension: Secondary | ICD-10-CM | POA: Insufficient documentation

## 2016-02-14 DIAGNOSIS — J069 Acute upper respiratory infection, unspecified: Secondary | ICD-10-CM | POA: Insufficient documentation

## 2016-02-14 MED ORDER — PSEUDOEPHEDRINE HCL 30 MG PO TABS: 30.0000 mg | ORAL_TABLET | ORAL | 0 refills | Status: DC | PRN

## 2016-02-14 MED ORDER — ERYTHROMYCIN 5 MG/GM OP OINT
TOPICAL_OINTMENT | Freq: Once | OPHTHALMIC | Status: AC
  Administered 2016-02-14: 10:00:00 via OPHTHALMIC
  Filled 2016-02-14: qty 3.5

## 2016-02-14 MED ORDER — FLUTICASONE PROPIONATE 50 MCG/ACT NA SUSP: 1.0000 | Freq: Every day | NASAL | 2 refills | Status: DC

## 2016-02-14 MED ORDER — PREDNISONE 20 MG PO TABS: 40.0000 mg | ORAL_TABLET | Freq: Every day | ORAL | 0 refills | Status: DC

## 2016-02-14 NOTE — ED Notes (Signed)
Discharge instructions, follow up care, and rx x3 reviewed with patient. Patient verbalized understanding. 

## 2016-02-14 NOTE — ED Triage Notes (Addendum)
Pt states cough, sore throat,ear pain, runny nose/congestion, sinus pain x 2 weeks. No fever or SOB. Reports productive cough and "pink eye."

## 2016-02-14 NOTE — ED Provider Notes (Signed)
[facility] DEPT Provider Note   CSN: [id number] Arrival date & time: 02/14/16  0744     History   Chief Complaint Chief Complaint  Patient presents with  . Nasal Congestion    HPI Ronald [name] is a 45 y.o. male.  HPI Ronald [name] is a 45 y.o. male with history of hypertension, presents to emergency department with flulike symptoms. Patient reports cough, nasal congestion, sinus pressure, no redness to the left eye. States symptoms initially started 2 weeks ago. States symptoms started with cough, reports that now symptoms are more in his sinuses and developed new onset of redness to the left eye that started today. Patient has been seen 3times for the same in our ER prior to today. He was initially seen on 02/08/16 for sore throat, 02/10/16 for sore throat, was seen yesterday for cough. Two CXRs negative. States he is currently using inhaler as needed and taken Tessalon pearls for cough. States this morning his sinus pressure and nasal congestion has worsened and he woke up with itching and redness to the left eye. He denies any drainage but states I was matted shut when he woke up. He denies any fever or chills at this time, however, he did have fever 1 week ago when symptoms began. He states "I feel terrible." He has not tried any decongestants. He has not tried Tylenol or Motrin. He was sent home from work because of the [place]. He reports 3 sick children at home with similar symptoms.  Past Medical History:  Diagnosis Date  . Hypertension     Patient Active Problem List   Diagnosis Date Noted  . Chest pain 12/15/2015  . Lower leg injury 01/19/2015    Past Surgical History:  Procedure Laterality Date  . arm surgery     torn bicept       Home Medications    Prior to Admission medications   Medication Sig Start Date End Date Taking? Authorizing Provider  benzonatate (TESSALON) 100 MG capsule Take 1 capsule (100 mg total) by mouth 3 (three) times daily as needed for cough.  02/13/16   Sherwood Gambler, MD  Cephalexin (KEFLEX PO) Take by mouth.    Historical Provider, MD  guaifenesin (ROBITUSSIN) 100 MG/5ML syrup Take 5-10 mLs (100-200 mg total) by mouth every 4 (four) hours as needed for cough. 02/13/16   Sherwood Gambler, MD  hydrochlorothiazide (HYDRODIURIL) 25 MG tablet Take 1 tablet (25 mg total) by mouth daily. Need to establish care with PCP for further refill 02/08/16   Sherwood Gambler, MD    Family History Family History  Problem Relation Age of Onset  . Hypertension Mother   . Hypertension Father   . Cancer Other     Social History Social History  Substance Use Topics  . Smoking status: Former Smoker    Packs/day: 0.00    Types: Cigarettes    Quit date: 05/11/2015  . Smokeless tobacco: Never Used  . Alcohol use No     Allergies   Penicillins and Sulfa antibiotics   Review of Systems Review of Systems  Constitutional: Negative for chills and fever.  HENT: Positive for congestion, postnasal drip, sinus pressure and sore throat. Negative for facial swelling, hearing loss, mouth sores, trouble swallowing and voice change.   Eyes: Positive for discharge, redness and itching. Negative for photophobia and pain.  Respiratory: Positive for cough. Negative for chest tightness and shortness of breath.   Cardiovascular: Negative for chest pain, palpitations and leg swelling.  Gastrointestinal: Negative  for abdominal distention, abdominal pain, diarrhea, nausea and vomiting.  Genitourinary: Negative for dysuria, frequency, hematuria and urgency.  Musculoskeletal: Positive for myalgias. Negative for arthralgias, neck pain and neck stiffness.  Skin: Negative for rash.  Allergic/Immunologic: Negative for immunocompromised state.  Neurological: Positive for headaches. Negative for dizziness, weakness, light-headedness and numbness.  All other systems reviewed and are negative.    Physical Exam Updated Vital Signs BP 136/87 (BP Location: Right Arm)    Pulse 76   Temp 98.2 F (36.8 C) (Oral)   Resp 16   SpO2 98%   Physical Exam  Constitutional: He is oriented to person, place, and time. He appears well-developed and well-nourished. No distress.  HENT:  Head: Normocephalic and atraumatic.  Right Ear: External ear normal.  Left Ear: External ear normal.  Mouth/Throat: Oropharynx is clear and moist.  Clear rhinorrhea, pharynx erythemous, uvula midline  Eyes: EOM are normal. Pupils are equal, round, and reactive to light. Right eye exhibits no discharge. Left eye exhibits no discharge.  Left conjunctival injection.   Neck: Normal range of motion. Neck supple.  No meningeal signs  Cardiovascular: Normal rate, regular rhythm and normal heart sounds.   Pulmonary/Chest: Effort normal and breath sounds normal. No respiratory distress. He has no wheezes. He has no rales.  Abdominal: Soft. Bowel sounds are normal. He exhibits no distension. There is no tenderness. There is no rebound.  Musculoskeletal: He exhibits no edema or tenderness.  Lymphadenopathy:    He has no cervical adenopathy.  Neurological: He is alert and oriented to person, place, and time.  Skin: Skin is warm and dry. No erythema.  Psychiatric: He has a normal mood and affect.  Nursing note and vitals reviewed.    ED Treatments / Results  Labs (all labs ordered are listed, but only abnormal results are displayed) Labs Reviewed - No data to display  EKG  EKG Interpretation None       Radiology Dg Chest 2 View  Result Date: 02/13/2016 CLINICAL DATA:  Cough, fever and congestion for 2 weeks. EXAM: CHEST  2 VIEW COMPARISON:  PA and lateral chest 02/10/2016 and 05/18/2015. FINDINGS: The lungs are clear. Heart size is normal. There is no pneumothorax or pleural effusion. No focal bony abnormality. IMPRESSION: Negative chest. Electronically Signed   By: Inge Rise M.D.   On: 02/13/2016 07:55    Procedures Procedures (including critical care time)  Medications  Ordered in ED Medications - No data to display   Initial Impression / Assessment and Plan / ED Course  I have reviewed the triage vital signs and the nursing notes.  Pertinent labs & imaging results that were available during my care of the patient were reviewed by me and considered in my medical decision making (see chart for details).  Clinical Course    4th visit for same. Pt with what appears to be viral URI. Today afebrile. Non toxic appearing. No meningismus. Normal VS. Sick kids at home with same symptoms. Currently taking tessalon and inhaler. Will add prednisone 5 day burst. Erythromycin ointment for conjunctivitis. Flonase, sudafed, afrin, intranasal saline. Follow up with pcp.   Final Clinical Impressions(s) / ED Diagnoses   Final diagnoses:  URI (upper respiratory infection)  Conjunctivitis of left eye    New Prescriptions New Prescriptions   No medications on file     Jeannett Senior, PA-C 02/14/16 [place], MD 02/14/16 1533

## 2016-02-14 NOTE — Discharge Instructions (Signed)
Erythromycin ointment to left eye every 4 hrs. Flonase daily. Sudafed for congestion. You can also try afrin for congestion sold over the counter, however do not take for more than 3 days. Try intranasal saline every 1-2 hrs. Tylenol or motrin for headache and fever. Follow up with urgent care of family doctor. Return if worsening.

## 2016-04-10 ENCOUNTER — Encounter (HOSPITAL_BASED_OUTPATIENT_CLINIC_OR_DEPARTMENT_OTHER): Payer: Self-pay | Admitting: *Deleted

## 2016-04-10 ENCOUNTER — Emergency Department (HOSPITAL_BASED_OUTPATIENT_CLINIC_OR_DEPARTMENT_OTHER)
Admission: EM | Admit: 2016-04-10 | Discharge: 2016-04-10 | Disposition: A | Discharge status: Home / Self Care | Payer: Self-pay | Attending: Emergency Medicine | Admitting: Emergency Medicine

## 2016-04-10 DIAGNOSIS — F17210 Nicotine dependence, cigarettes, uncomplicated: Secondary | ICD-10-CM | POA: Insufficient documentation

## 2016-04-10 DIAGNOSIS — J019 Acute sinusitis, unspecified: Secondary | ICD-10-CM | POA: Insufficient documentation

## 2016-04-10 DIAGNOSIS — Z79899 Other long term (current) drug therapy: Secondary | ICD-10-CM | POA: Insufficient documentation

## 2016-04-10 DIAGNOSIS — I10 Essential (primary) hypertension: Secondary | ICD-10-CM | POA: Insufficient documentation

## 2016-04-10 DIAGNOSIS — Z7951 Long term (current) use of inhaled steroids: Secondary | ICD-10-CM | POA: Insufficient documentation

## 2016-04-10 MED ORDER — AZITHROMYCIN 250 MG PO TABS: 250.0000 mg | ORAL_TABLET | Freq: Every day | ORAL | 0 refills | Status: DC

## 2016-04-10 MED ORDER — AZITHROMYCIN 250 MG PO TABS
500.0000 mg | ORAL_TABLET | Freq: Once | ORAL | Status: AC
  Administered 2016-04-10: 500 mg via ORAL
  Filled 2016-04-10: qty 2

## 2016-04-10 MED ORDER — HYDROCHLOROTHIAZIDE 25 MG PO TABS: 25.0000 mg | ORAL_TABLET | Freq: Every day | ORAL | 1 refills | Status: DC

## 2016-04-10 MED FILL — AZITHROMYCIN 250 MG TABLET: 250 | 4 days supply | Qty: 4 | Fill #0

## 2016-04-10 NOTE — Discharge Instructions (Signed)
Zithromax as prescribed.  Continue taking over-the-counter medications as needed for symptom relief.  Return to the emergency department if your symptoms significantly worsen or change.

## 2016-04-10 NOTE — ED Provider Notes (Signed)
[facility] Provider Note   CSN: [id number] Arrival date & time: 04/10/16  [id number]     History   Chief Complaint Chief Complaint  Patient presents with  . Cough  . Wound Check    HPI Ronald [name] is a 45 y.o. male.  Patient is a 45 year old male with no significant past medical history. He presents for evaluation of a 10 day history of sinus ingestion and drainage. He reports "being choked" at night when he is trying to sleep. He denies any fevers or chills. He denies any chest pain or difficulty breathing.   The history is provided by the patient.  Cough  This is a new problem. The current episode started more than 1 week ago. The problem occurs constantly. The cough is productive of sputum. There has been no fever. Associated symptoms include ear congestion. Pertinent negatives include no chills and no shortness of breath. He has tried decongestants for the symptoms. The treatment provided no relief.  Wound Check  Pertinent negatives include no shortness of breath.    Past Medical History:  Diagnosis Date  . Hypertension     Patient Active Problem List   Diagnosis Date Noted  . Chest pain 12/15/2015  . Lower leg injury 01/19/2015    Past Surgical History:  Procedure Laterality Date  . arm surgery     torn bicept       Home Medications    Prior to Admission medications   Medication Sig Start Date End Date Taking? Authorizing Provider  fluticasone (FLONASE) 50 MCG/ACT nasal spray Place 1 spray into both nostrils daily. 02/14/16  Yes Tatyana Kirichenko, PA-C  guaifenesin (ROBITUSSIN) 100 MG/5ML syrup Take 5-10 mLs (100-200 mg total) by mouth every 4 (four) hours as needed for cough. 02/13/16  Yes Sherwood Gambler, MD  hydrochlorothiazide (HYDRODIURIL) 25 MG tablet Take 1 tablet (25 mg total) by mouth daily. Need to establish care with PCP for further refill 02/08/16  Yes Sherwood Gambler, MD  benzonatate (TESSALON) 100 MG capsule Take 1 capsule (100 mg total)  by mouth 3 (three) times daily as needed for cough. 02/13/16   Sherwood Gambler, MD  Cephalexin (KEFLEX PO) Take by mouth.    Historical Provider, MD  predniSONE (DELTASONE) 20 MG tablet Take 2 tablets (40 mg total) by mouth daily. 02/14/16   Tatyana Kirichenko, PA-C  pseudoephedrine (SUDAFED) 30 MG tablet Take 1 tablet (30 mg total) by mouth every 4 (four) hours as needed for congestion. 02/14/16   Jeannett Senior, PA-C    Family History Family History  Problem Relation Age of Onset  . Hypertension Mother   . Hypertension Father   . Cancer Other     Social History Social History  Substance Use Topics  . Smoking status: Current Every Day Smoker    Packs/day: 0.00    Types: Cigarettes    Last attempt to quit: 05/11/2015  . Smokeless tobacco: Never Used  . Alcohol use No     Allergies   Penicillins and Sulfa antibiotics   Review of Systems Review of Systems  Constitutional: Negative for chills.  Respiratory: Positive for cough. Negative for shortness of breath.   All other systems reviewed and are negative.    Physical Exam Updated Vital Signs BP (!) 151/111 (BP Location: Left Arm)   Pulse 68   Temp 97.6 F (36.4 C)   Ht 5\' 9"  (1.753 m)   Wt 190 lb (86.2 kg)   SpO2 98%   BMI 28.06 kg/m  Physical Exam  Constitutional: He is oriented to person, place, and time. He appears well-developed and well-nourished. No distress.  HENT:  Head: Normocephalic and atraumatic.  Mouth/Throat: Oropharynx is clear and moist. No oropharyngeal exudate.  TMs clear bilaterally.  Neck: Normal range of motion. Neck supple.  Cardiovascular: Normal rate, regular rhythm and normal heart sounds.  Exam reveals no friction rub.   No murmur heard. Pulmonary/Chest: Effort normal and breath sounds normal. No respiratory distress. He has no wheezes. He has no rales.  Abdominal: Soft. Bowel sounds are normal. He exhibits no distension. There is no tenderness.  Musculoskeletal: Normal range of  motion. He exhibits no edema.  Lymphadenopathy:    He has no cervical adenopathy.  Neurological: He is alert and oriented to person, place, and time. Coordination normal.  Skin: Skin is warm and dry. He is not diaphoretic.  Nursing note and vitals reviewed.    ED Treatments / Results  Labs (all labs ordered are listed, but only abnormal results are displayed) Labs Reviewed - No data to display  EKG  EKG Interpretation None       Radiology No results found.  Procedures Procedures (including critical care time)  Medications Ordered in ED Medications - No data to display   Initial Impression / Assessment and Plan / ED Course  I have reviewed the triage vital signs and the nursing notes.  Pertinent labs & imaging results that were available during my care of the patient were reviewed by me and considered in my medical decision making (see chart for details).  Clinical Course     As patient has been sick for greater than 10 days and symptoms are worsening, I will diagnose him with acute sinusitis and treat with Zithromax. He is also requested a refill of his hydrochlorothiazide which will be provided. Is to follow-up with his primary Dr. if not improving.  Final Clinical Impressions(s) / ED Diagnoses   Final diagnoses:  None    New Prescriptions New Prescriptions   No medications on file     Veryl Speak, MD 04/10/16 [phone]

## 2016-04-10 NOTE — ED Notes (Signed)
VORB from Dr. Stark Jock to change Zithromax prescription to one tablet, daily, dispense 4, no refills. Pam in pharmacy notified

## 2016-04-10 NOTE — ED Triage Notes (Signed)
EDP at bedside. Pt reports productive cough x1.5 wks. Reports nasal congestion/drainage, intermittent decreased hearing in bil ears. Denies fever, sore throat, sob. Also has red, raised area behind R ear.

## 2016-04-11 ENCOUNTER — Encounter (HOSPITAL_BASED_OUTPATIENT_CLINIC_OR_DEPARTMENT_OTHER): Payer: Self-pay | Admitting: *Deleted

## 2016-04-11 ENCOUNTER — Emergency Department (HOSPITAL_BASED_OUTPATIENT_CLINIC_OR_DEPARTMENT_OTHER)
Admission: EM | Admit: 2016-04-11 | Discharge: 2016-04-11 | Disposition: A | Discharge status: Home / Self Care | Payer: Self-pay | Attending: Emergency Medicine | Admitting: Emergency Medicine

## 2016-04-11 DIAGNOSIS — I10 Essential (primary) hypertension: Secondary | ICD-10-CM | POA: Insufficient documentation

## 2016-04-11 DIAGNOSIS — D170 Benign lipomatous neoplasm of skin and subcutaneous tissue of head, face and neck: Secondary | ICD-10-CM | POA: Insufficient documentation

## 2016-04-11 DIAGNOSIS — F17210 Nicotine dependence, cigarettes, uncomplicated: Secondary | ICD-10-CM | POA: Insufficient documentation

## 2016-04-11 NOTE — ED Triage Notes (Signed)
Pt reports a bump behind right ear, concerned as he was seen her yesterday and dx with sinusitis.

## 2016-04-11 NOTE — ED Provider Notes (Signed)
[facility] Provider Note   CSN: [id number] Arrival date & time: 04/11/16  [id number]     History   Chief Complaint Chief Complaint  Patient presents with  . Ear Problem    HPI Ronald [name] is a 45 y.o. male.  45 yo M with a chief complaint of a mass. Patient noticed this yesterday when he he took his earring out. Had not noticed this previously. Denies tenderness. Has been having URI-like symptoms for over a week. Was seen here yesterday and started on antibiotics for possible sinusitis. Patient has noted no significant change from yesterday.   The history is provided by the patient.  Illness  This is a new problem. The current episode started more than 1 week ago. The problem occurs constantly. The problem has not changed since onset.Pertinent negatives include no chest pain, no abdominal pain, no headaches and no shortness of breath. Nothing aggravates the symptoms. Nothing relieves the symptoms. He has tried nothing for the symptoms. The treatment provided no relief.    Past Medical History:  Diagnosis Date  . Hypertension     Patient Active Problem List   Diagnosis Date Noted  . Chest pain 12/15/2015  . Lower leg injury 01/19/2015    Past Surgical History:  Procedure Laterality Date  . arm surgery     torn bicept       Home Medications    Prior to Admission medications   Medication Sig Start Date End Date Taking? Authorizing Provider  azithromycin (ZITHROMAX) 250 MG tablet Take 1 tablet (250 mg total) by mouth daily. Take first 2 tablets together, then 1 every day until finished. 04/10/16   Veryl Speak, MD  benzonatate (TESSALON) 100 MG capsule Take 1 capsule (100 mg total) by mouth 3 (three) times daily as needed for cough. 02/13/16   Sherwood Gambler, MD  Cephalexin (KEFLEX PO) Take by mouth.    Historical Provider, MD  fluticasone (FLONASE) 50 MCG/ACT nasal spray Place 1 spray into both nostrils daily. 02/14/16   Tatyana Kirichenko, PA-C  guaifenesin  (ROBITUSSIN) 100 MG/5ML syrup Take 5-10 mLs (100-200 mg total) by mouth every 4 (four) hours as needed for cough. 02/13/16   Sherwood Gambler, MD  hydrochlorothiazide (HYDRODIURIL) 25 MG tablet Take 1 tablet (25 mg total) by mouth daily. 04/10/16   Veryl Speak, MD  predniSONE (DELTASONE) 20 MG tablet Take 2 tablets (40 mg total) by mouth daily. 02/14/16   Tatyana Kirichenko, PA-C  pseudoephedrine (SUDAFED) 30 MG tablet Take 1 tablet (30 mg total) by mouth every 4 (four) hours as needed for congestion. 02/14/16   Jeannett Senior, PA-C    Family History Family History  Problem Relation Age of Onset  . Hypertension Mother   . Hypertension Father   . Cancer Other     Social History Social History  Substance Use Topics  . Smoking status: Current Every Day Smoker    Packs/day: 0.00    Types: Cigarettes    Last attempt to quit: 05/11/2015  . Smokeless tobacco: Never Used  . Alcohol use No     Allergies   Penicillins and Sulfa antibiotics   Review of Systems Review of Systems  Constitutional: Negative for chills and fever.  HENT: Positive for congestion, facial swelling, sinus pain and sinus pressure.   Eyes: Negative for discharge and visual disturbance.  Respiratory: Positive for choking. Negative for shortness of breath.   Cardiovascular: Negative for chest pain and palpitations.  Gastrointestinal: Negative for abdominal pain, diarrhea and vomiting.  Musculoskeletal: Negative for arthralgias and myalgias.  Skin: Negative for color change and rash.  Neurological: Negative for tremors, syncope and headaches.  Psychiatric/Behavioral: Negative for confusion and dysphoric mood.     Physical Exam Updated Vital Signs BP 144/100 (BP Location: Left Arm)   Pulse 82   Temp 97.4 F (36.3 C) (Oral)   Resp 18   Ht 5\' 9"  (1.753 m)   Wt 190 lb 1.6 oz (86.2 kg)   SpO2 100%   BMI 28.07 kg/m   Physical Exam  Constitutional: He is oriented to person, place, and time. He appears  well-developed and well-nourished.  HENT:  Head: Normocephalic and atraumatic.  Swollen turbinates, posterior nasal drip, no noted sinus ttp, tm normal bilaterally.   The patient has a small firm mobile pea-sized mass to the right mastoid. No noted erythema. Nontender   Eyes: EOM are normal. Pupils are equal, round, and reactive to light.  Neck: Normal range of motion. Neck supple. No JVD present.  Cardiovascular: Normal rate and regular rhythm.  Exam reveals no gallop and no friction rub.   No murmur heard. Pulmonary/Chest: No respiratory distress. He has no wheezes.  Abdominal: He exhibits no distension. There is no rebound and no guarding.  Musculoskeletal: Normal range of motion.  Neurological: He is alert and oriented to person, place, and time.  Skin: No rash noted. No pallor.  Psychiatric: He has a normal mood and affect. His behavior is normal.  Nursing note and vitals reviewed.    ED Treatments / Results  Labs (all labs ordered are listed, but only abnormal results are displayed) Labs Reviewed - No data to display  EKG  EKG Interpretation None       Radiology No results found.  Procedures Procedures (including critical care time)  Medications Ordered in ED Medications - No data to display   Initial Impression / Assessment and Plan / ED Course  I have reviewed the triage vital signs and the nursing notes.  Pertinent labs & imaging results that were available during my care of the patient were reviewed by me and considered in my medical decision making (see chart for details).  Clinical Course     44 yo M With a chief complaint of a mass behind his right ear. This has the consistency of a lipoma. It is not tender and is mobile. Patient has had no B symptoms. I will have him follow-up with his family physician.  8:15 AM:  I have discussed the diagnosis/risks/treatment options with the patient and believe the pt to be eligible for discharge home to follow-up  with PCP. We also discussed returning to the ED immediately if new or worsening sx occur. We discussed the sx which are most concerning (e.g., sudden worsening pain, fever, inability to tolerate by mouth) that necessitate immediate return. Medications administered to the patient during their visit and any new prescriptions provided to the patient are listed below.  Medications given during this visit Medications - No data to display   The patient appears reasonably screen and/or stabilized for discharge and I doubt any other medical condition or other [facility] requiring further screening, evaluation, or treatment in the ED at this time prior to discharge.    Final Clinical Impressions(s) / ED Diagnoses   Final diagnoses:  Lipoma of head    New Prescriptions New Prescriptions   No medications on file     Deno Etienne, DO 04/11/16 0815

## 2016-04-23 MED FILL — HYDROCHLOROTHIAZIDE 25 MG T: 25 | 30 days supply | Qty: 30 | Fill #0

## 2016-05-16 ENCOUNTER — Emergency Department (HOSPITAL_BASED_OUTPATIENT_CLINIC_OR_DEPARTMENT_OTHER)
Admission: EM | Admit: 2016-05-16 | Discharge: 2016-05-16 | Disposition: A | Discharge status: Home / Self Care | Payer: Self-pay | Attending: Emergency Medicine | Admitting: Emergency Medicine

## 2016-05-16 ENCOUNTER — Encounter (HOSPITAL_BASED_OUTPATIENT_CLINIC_OR_DEPARTMENT_OTHER): Payer: Self-pay | Admitting: *Deleted

## 2016-05-16 DIAGNOSIS — H6991 Unspecified Eustachian tube disorder, right ear: Secondary | ICD-10-CM | POA: Insufficient documentation

## 2016-05-16 DIAGNOSIS — I10 Essential (primary) hypertension: Secondary | ICD-10-CM | POA: Insufficient documentation

## 2016-05-16 DIAGNOSIS — H9201 Otalgia, right ear: Secondary | ICD-10-CM

## 2016-05-16 DIAGNOSIS — H6981 Other specified disorders of Eustachian tube, right ear: Secondary | ICD-10-CM

## 2016-05-16 DIAGNOSIS — Z79899 Other long term (current) drug therapy: Secondary | ICD-10-CM | POA: Insufficient documentation

## 2016-05-16 DIAGNOSIS — F17290 Nicotine dependence, other tobacco product, uncomplicated: Secondary | ICD-10-CM | POA: Insufficient documentation

## 2016-05-16 MED ORDER — FLUTICASONE PROPIONATE 50 MCG/ACT NA SUSP: 2.0000 | Freq: Every day | NASAL | 2 refills | Status: DC

## 2016-05-16 MED ORDER — CLINDAMYCIN HCL 150 MG PO CAPS: 300.0000 mg | ORAL_CAPSULE | Freq: Three times a day (TID) | ORAL | 0 refills | Status: DC

## 2016-05-16 MED ORDER — OXYMETAZOLINE HCL 0.05 % NA SOLN: 1.0000 | Freq: Two times a day (BID) | NASAL | 0 refills | Status: DC

## 2016-05-16 MED ORDER — NAPROXEN 500 MG PO TABS: 500.0000 mg | ORAL_TABLET | Freq: Two times a day (BID) | ORAL | 0 refills | Status: DC

## 2016-05-16 MED ORDER — PREDNISONE 20 MG PO TABS
40.0000 mg | ORAL_TABLET | Freq: Every day | ORAL | 0 refills | Status: DC
Start: 2016-05-16 — End: 2016-07-10

## 2016-05-16 NOTE — ED Provider Notes (Signed)
[facility] DEPT [facility] Provider Note   CSN: [id number] Arrival date & time: 05/16/16  [id number]     History   Chief Complaint Chief Complaint  Patient presents with  . Otalgia    HPI Ronald [name] is a 45 y.o. male who presents emergency Department with chief complaint of right ear pain. He states it has been ongoing for about the past 4 days. He has associated needed, jaw pain, right tonsillar lymphadenopathy and pain in the tooth. He states he has never had any problems with his teeth before. His pain is moderate. She denies pain with swallowing, upper respiratory infection symptoms. He did have a recent sinus infection about 2 weeks ago which was treated with antibiotics. He denies headaches, nausea, vomiting, chills, myalgias, fevers.  HPI  Past Medical History:  Diagnosis Date  . Hypertension     Patient Active Problem List   Diagnosis Date Noted  . Chest pain 12/15/2015  . Lower leg injury 01/19/2015    Past Surgical History:  Procedure Laterality Date  . arm surgery     torn bicept       Home Medications    Prior to Admission medications   Medication Sig Start Date End Date Taking? Authorizing Provider  hydrochlorothiazide (HYDRODIURIL) 25 MG tablet Take 1 tablet (25 mg total) by mouth daily. 04/10/16  Yes Veryl Speak, MD  azithromycin (ZITHROMAX) 250 MG tablet Take 1 tablet (250 mg total) by mouth daily. Take first 2 tablets together, then 1 every day until finished. 04/10/16   Veryl Speak, MD  benzonatate (TESSALON) 100 MG capsule Take 1 capsule (100 mg total) by mouth 3 (three) times daily as needed for cough. 02/13/16   Sherwood Gambler, MD  Cephalexin (KEFLEX PO) Take by mouth.    Historical Provider, MD  clindamycin (CLEOCIN) 150 MG capsule Take 2 capsules (300 mg total) by mouth 3 (three) times daily. 05/16/16   Margarita Mail, PA-C  fluticasone (FLONASE) 50 MCG/ACT nasal spray Place 2 sprays into both nostrils daily. 05/16/16   Margarita Mail, PA-C    guaifenesin (ROBITUSSIN) 100 MG/5ML syrup Take 5-10 mLs (100-200 mg total) by mouth every 4 (four) hours as needed for cough. 02/13/16   Sherwood Gambler, MD  naproxen (NAPROSYN) 500 MG tablet Take 1 tablet (500 mg total) by mouth 2 (two) times daily with a meal. 05/16/16   Margarita Mail, PA-C  oxymetazoline (AFRIN NASAL SPRAY) 0.05 % nasal spray Place 1 spray into both nostrils 2 (two) times daily. Take for no longer than 4 days 05/16/16   Margarita Mail, PA-C  predniSONE (DELTASONE) 20 MG tablet Take 2 tablets (40 mg total) by mouth daily. 05/16/16   Margarita Mail, PA-C  pseudoephedrine (SUDAFED) 30 MG tablet Take 1 tablet (30 mg total) by mouth every 4 (four) hours as needed for congestion. 02/14/16   Jeannett Senior, PA-C    Family History Family History  Problem Relation Age of Onset  . Hypertension Mother   . Hypertension Father   . Cancer Other     Social History Social History  Substance Use Topics  . Smoking status: Current Every Day Smoker    Packs/day: 0.00    Types: Cigars    Last attempt to quit: 05/11/2015  . Smokeless tobacco: Never Used  . Alcohol use 0.0 oz/week     Comment: occasional     Allergies   Penicillins and Sulfa antibiotics   Review of Systems Review of Systems Ten systems reviewed and are negative for acute change,  except as noted in the HPI.    Physical Exam Updated Vital Signs BP (!) 162/105 (BP Location: Left Arm)   Pulse 61   Temp 97.8 F (36.6 C) (Oral)   Resp 18   Ht 5\' 9"  (1.753 m)   Wt 88.5 kg   SpO2 100%   BMI 28.80 kg/m   Physical Exam  Constitutional: He appears well-developed and well-nourished. No distress.  HENT:  Head: Normocephalic and atraumatic.  Bilateral TMs normal No signs of dental infection, teeth without overt caries, no tenderness to palpation in the gums or teeth. Right tonsillar lymphadenopathy with tenderness.  Eyes: Conjunctivae and EOM are normal. Pupils are equal, round, and reactive to light. No  scleral icterus.  Neck: Normal range of motion. Neck supple.  Cardiovascular: Normal rate, regular rhythm and normal heart sounds.   Pulmonary/Chest: Effort normal and breath sounds normal. No respiratory distress.  Abdominal: Soft. There is no tenderness.  Musculoskeletal: He exhibits no edema.  Neurological: He is alert.  Skin: Skin is warm and dry. He is not diaphoretic.  Psychiatric: His behavior is normal.  Nursing note and vitals reviewed.    ED Treatments / Results  Labs (all labs ordered are listed, but only abnormal results are displayed) Labs Reviewed - No data to display  EKG  EKG Interpretation None       Radiology No results found.  Procedures Procedures (including critical care time)  Medications Ordered in ED Medications - No data to display   Initial Impression / Assessment and Plan / ED Course  I have reviewed the triage vital signs and the nursing notes.  Pertinent labs & imaging results that were available during my care of the patient were reviewed by me and considered in my medical decision making (see chart for details).  Clinical Course     Patient with persistent right ear pain. He states that he also grinds his teeth at night and thinks that may be contributing to the situation. The patient potentially has eustachian tube dysfunction referring to the ear. We will treat for this. However, he may also have a developing dental infection which has not yet declared itself. As such, the patient will be given a prescription for clindamycin to go for worsening pain.Patient also noted to be hypertensive. I did not place this on the patient's diagnosis list or give him follow-up instructions. Patient called and informed of hypertension here in the emergency department and advised to follow-up with [facility].  Final Clinical Impressions(s) / ED Diagnoses   Final diagnoses:  Otalgia of right ear  Eustachian tube dysfunction,  right    New Prescriptions New Prescriptions   CLINDAMYCIN (CLEOCIN) 150 MG CAPSULE    Take 2 capsules (300 mg total) by mouth 3 (three) times daily.   FLUTICASONE (FLONASE) 50 MCG/ACT NASAL SPRAY    Place 2 sprays into both nostrils daily.   NAPROXEN (NAPROSYN) 500 MG TABLET    Take 1 tablet (500 mg total) by mouth 2 (two) times daily with a meal.   OXYMETAZOLINE (AFRIN NASAL SPRAY) 0.05 % NASAL SPRAY    Place 1 spray into both nostrils 2 (two) times daily. Take for no longer than 4 days   PREDNISONE (DELTASONE) 20 MG TABLET    Take 2 tablets (40 mg total) by mouth daily.     Margarita Mail, PA-C 05/16/16 [place], MD 05/17/16 [phone]

## 2016-05-16 NOTE — ED Triage Notes (Signed)
Pt reports recent sinus infection. C/o right ear pain and swollen node on right side of neck x 1 week

## 2016-05-16 NOTE — ED Notes (Signed)
Pt directed to pharmacy to pick up Rx 

## 2016-05-16 NOTE — Discharge Instructions (Signed)
Get help right away if: You have a severe headache. You have a stiff neck. You have trouble swallowing. You have redness or swelling behind your ear. You have fluid or blood coming from your ear. You have hearing loss. You feel dizzy.

## 2016-05-24 ENCOUNTER — Emergency Department (HOSPITAL_BASED_OUTPATIENT_CLINIC_OR_DEPARTMENT_OTHER)
Admission: EM | Admit: 2016-05-24 | Discharge: 2016-05-24 | Disposition: A | Discharge status: Home / Self Care | Payer: Self-pay | Attending: Dermatology | Admitting: Dermatology

## 2016-05-24 ENCOUNTER — Encounter (HOSPITAL_BASED_OUTPATIENT_CLINIC_OR_DEPARTMENT_OTHER): Payer: Self-pay | Admitting: *Deleted

## 2016-05-24 DIAGNOSIS — R0602 Shortness of breath: Secondary | ICD-10-CM | POA: Insufficient documentation

## 2016-05-24 DIAGNOSIS — F17290 Nicotine dependence, other tobacco product, uncomplicated: Secondary | ICD-10-CM | POA: Insufficient documentation

## 2016-05-24 DIAGNOSIS — Z791 Long term (current) use of non-steroidal anti-inflammatories (NSAID): Secondary | ICD-10-CM | POA: Insufficient documentation

## 2016-05-24 DIAGNOSIS — Z5321 Procedure and treatment not carried out due to patient leaving prior to being seen by health care provider: Secondary | ICD-10-CM | POA: Insufficient documentation

## 2016-05-24 DIAGNOSIS — I10 Essential (primary) hypertension: Secondary | ICD-10-CM | POA: Insufficient documentation

## 2016-05-24 NOTE — ED Triage Notes (Signed)
He ran out of BP meds a couple of days ago. States he is having sob and pain across the back of his neck x 2 days. He his ambulatory, speaking in complete sentences. No distress at triage.

## 2016-06-25 ENCOUNTER — Emergency Department (HOSPITAL_BASED_OUTPATIENT_CLINIC_OR_DEPARTMENT_OTHER)
Admission: EM | Admit: 2016-06-25 | Discharge: 2016-06-25 | Disposition: A | Discharge status: Home / Self Care | Payer: Self-pay | Attending: Emergency Medicine | Admitting: Emergency Medicine

## 2016-06-25 ENCOUNTER — Encounter (HOSPITAL_BASED_OUTPATIENT_CLINIC_OR_DEPARTMENT_OTHER): Payer: Self-pay | Admitting: *Deleted

## 2016-06-25 DIAGNOSIS — I10 Essential (primary) hypertension: Secondary | ICD-10-CM | POA: Insufficient documentation

## 2016-06-25 DIAGNOSIS — F17210 Nicotine dependence, cigarettes, uncomplicated: Secondary | ICD-10-CM | POA: Insufficient documentation

## 2016-06-25 DIAGNOSIS — J310 Chronic rhinitis: Secondary | ICD-10-CM | POA: Insufficient documentation

## 2016-06-25 DIAGNOSIS — J019 Acute sinusitis, unspecified: Secondary | ICD-10-CM | POA: Insufficient documentation

## 2016-06-25 DIAGNOSIS — Z79899 Other long term (current) drug therapy: Secondary | ICD-10-CM | POA: Insufficient documentation

## 2016-06-25 MED ORDER — FLUTICASONE PROPIONATE 50 MCG/ACT NA SUSP: 2.0000 | Freq: Every day | NASAL | 1 refills | Status: DC

## 2016-06-25 MED ORDER — LORATADINE-PSEUDOEPHEDRINE ER 10-240 MG PO TB24: 1.0000 | ORAL_TABLET | Freq: Every day | ORAL | 1 refills | Status: DC

## 2016-06-25 MED FILL — ALLERGY RELIEF-NASAL DECONG: 10-240 | 30 days supply | Qty: 30 | Fill #0

## 2016-06-25 MED FILL — HYDROCHLOROTHIAZIDE 25 MG T: 25 | 30 days supply | Qty: 30 | Fill #0

## 2016-06-25 MED FILL — FLUTICASONE PROP 50 MCG SPR: 50 | 30 days supply | Qty: 16 | Fill #0

## 2016-06-25 NOTE — ED Provider Notes (Signed)
[facility] Provider Note   CSN: [id number] Arrival date & time: 06/25/16  [id number]     History   Chief Complaint Chief Complaint  Patient presents with  . sinus    HPI Ronald [name] is a 46 y.o. male.  HPI Patient has had recurrent congestion and pressure in his face. in the mornings he has drainage in the back of his throat. Sometimes he has facial pain on his cheeks. No fever or chills. No sore throat or cough. This has been recurrent now over the course of about 2 months. He has had 2 courses of antibiotics without improvement. No other associated symptoms. Past Medical History:  Diagnosis Date  . Hypertension     Patient Active Problem List   Diagnosis Date Noted  . Chest pain 12/15/2015  . Lower leg injury 01/19/2015    Past Surgical History:  Procedure Laterality Date  . arm surgery     torn bicept       Home Medications    Prior to Admission medications   Medication Sig Start Date End Date Taking? Authorizing Provider  azithromycin (ZITHROMAX) 250 MG tablet Take 1 tablet (250 mg total) by mouth daily. Take first 2 tablets together, then 1 every day until finished. 04/10/16   Veryl Speak, MD  benzonatate (TESSALON) 100 MG capsule Take 1 capsule (100 mg total) by mouth 3 (three) times daily as needed for cough. 02/13/16   Sherwood Gambler, MD  Cephalexin (KEFLEX PO) Take by mouth.    Historical Provider, MD  clindamycin (CLEOCIN) 150 MG capsule Take 2 capsules (300 mg total) by mouth 3 (three) times daily. 05/16/16   Margarita Mail, PA-C  fluticasone (FLONASE) 50 MCG/ACT nasal spray Place 2 sprays into both nostrils daily. 05/16/16   Margarita Mail, PA-C  fluticasone (FLONASE) 50 MCG/ACT nasal spray Place 2 sprays into both nostrils daily. 06/25/16   Charlesetta Shanks, MD  guaifenesin (ROBITUSSIN) 100 MG/5ML syrup Take 5-10 mLs (100-200 mg total) by mouth every 4 (four) hours as needed for cough. 02/13/16   Sherwood Gambler, MD  hydrochlorothiazide (HYDRODIURIL)  25 MG tablet Take 1 tablet (25 mg total) by mouth daily. 04/10/16   Veryl Speak, MD  loratadine-pseudoephedrine (CLARITIN-D 24 HOUR) 10-240 MG 24 hr tablet Take 1 tablet by mouth daily. 06/25/16   Charlesetta Shanks, MD  naproxen (NAPROSYN) 500 MG tablet Take 1 tablet (500 mg total) by mouth 2 (two) times daily with a meal. 05/16/16   Margarita Mail, PA-C  oxymetazoline (AFRIN NASAL SPRAY) 0.05 % nasal spray Place 1 spray into both nostrils 2 (two) times daily. Take for no longer than 4 days 05/16/16   Margarita Mail, PA-C  predniSONE (DELTASONE) 20 MG tablet Take 2 tablets (40 mg total) by mouth daily. 05/16/16   Margarita Mail, PA-C  pseudoephedrine (SUDAFED) 30 MG tablet Take 1 tablet (30 mg total) by mouth every 4 (four) hours as needed for congestion. 02/14/16   Jeannett Senior, PA-C    Family History Family History  Problem Relation Age of Onset  . Hypertension Mother   . Hypertension Father   . Cancer Other     Social History Social History  Substance Use Topics  . Smoking status: Current Every Day Smoker    Packs/day: 0.00    Types: Cigars    Last attempt to quit: 05/11/2015  . Smokeless tobacco: Never Used  . Alcohol use 0.0 oz/week     Comment: occasional     Allergies   Penicillins and Sulfa  antibiotics   Review of Systems Review of Systems Constitutional: No fever no chills no general malaise Respiratory: No cough chest pain or shortness of breath Physical Exam Updated Vital Signs BP (!) 144/103 (BP Location: Left Arm)   Pulse 62   Temp 97.7 F (36.5 C) (Oral)   Resp 18   Ht 5\' 9"  (1.753 m)   Wt 190 lb (86.2 kg)   SpO2 98%   BMI 28.06 kg/m   Physical Exam  Constitutional: He is oriented to person, place, and time. He appears well-developed and well-nourished. No distress.  HENT:  Head: Normocephalic and atraumatic.  Right Ear: External ear normal.  Left Ear: External ear normal.  Mouth/Throat: Oropharynx is clear and moist.  Bilateral TMs normal.  Nasal mucosa boggy bilaterally but no purulent drainage or discharge. Posterior oropharynx widely patent without postnasal drip. Dentention good condition. No facial swelling  Eyes: Conjunctivae and EOM are normal. Pupils are equal, round, and reactive to light.  Neck: Normal range of motion. Neck supple. No thyromegaly present.  Cardiovascular: Normal rate, regular rhythm, normal heart sounds and intact distal pulses.   Pulmonary/Chest: Effort normal and breath sounds normal.  Musculoskeletal: Normal range of motion.  Lymphadenopathy:    He has no cervical adenopathy.  Neurological: He is alert and oriented to person, place, and time. No cranial nerve deficit. He exhibits normal muscle tone. Coordination normal.  Skin: Skin is warm and dry.  Psychiatric: He has a normal mood and affect.     ED Treatments / Results  Labs (all labs ordered are listed, but only abnormal results are displayed) Labs Reviewed - No data to display  EKG  EKG Interpretation None       Radiology No results found.  Procedures Procedures (including critical care time)  Medications Ordered in ED Medications - No data to display   Initial Impression / Assessment and Plan / ED Course  I have reviewed the triage vital signs and the nursing notes.  Pertinent labs & imaging results that were available during my care of the patient were reviewed by me and considered in my medical decision making (see chart for details).       Final Clinical Impressions(s) / ED Diagnoses   Final diagnoses:  Other chronic rhinitis  Subacute sinusitis, unspecified location   Patient has had recurrent sinus type symptoms for about 2 months now. He has had 2 courses of antibiotics. At this time no signs of facial swelling or fever. Will treat symptomatically with Claritin-D and Flonase. He is counseled to follow up with ENT due to protracted length of symptoms. New Prescriptions New Prescriptions   FLUTICASONE  (FLONASE) 50 MCG/ACT NASAL SPRAY    Place 2 sprays into both nostrils daily.   LORATADINE-PSEUDOEPHEDRINE (CLARITIN-D 24 HOUR) 10-240 MG 24 HR TABLET    Take 1 tablet by mouth daily.     Charlesetta Shanks, MD 06/25/16 [phone]

## 2016-06-25 NOTE — ED Notes (Signed)
Pt requested refill of his HCTZ prior to leaving ED. Discussed with Dr Johnney Killian. RX for Hydrochlorothiazide 25mg  po daily, dispense 30, No refills, called to [facility] at [facility] per VORB from Dr Johnney Killian.

## 2016-06-25 NOTE — ED Triage Notes (Signed)
Pt c/o "sinus" pressure and congestion for past 2 months. Denies any fevers. States he was seen here one month ago but was not able to fill his Rx. Also states he is out of his b/p meds. Has been taking otc benadryl with little relief. Denies sob.

## 2016-07-01 ENCOUNTER — Emergency Department (HOSPITAL_BASED_OUTPATIENT_CLINIC_OR_DEPARTMENT_OTHER)
Admission: EM | Admit: 2016-07-01 | Discharge: 2016-07-01 | Disposition: A | Discharge status: Home / Self Care | Payer: Self-pay | Attending: Emergency Medicine | Admitting: Emergency Medicine

## 2016-07-01 ENCOUNTER — Encounter (HOSPITAL_BASED_OUTPATIENT_CLINIC_OR_DEPARTMENT_OTHER): Payer: Self-pay

## 2016-07-01 DIAGNOSIS — M79661 Pain in right lower leg: Secondary | ICD-10-CM | POA: Insufficient documentation

## 2016-07-01 DIAGNOSIS — I10 Essential (primary) hypertension: Secondary | ICD-10-CM | POA: Insufficient documentation

## 2016-07-01 DIAGNOSIS — Z79899 Other long term (current) drug therapy: Secondary | ICD-10-CM | POA: Insufficient documentation

## 2016-07-01 DIAGNOSIS — F17290 Nicotine dependence, other tobacco product, uncomplicated: Secondary | ICD-10-CM | POA: Insufficient documentation

## 2016-07-01 NOTE — ED Triage Notes (Signed)
Patient reports was jogging and felt a sharp pain in right calf.  Patient has small knot in right calf.

## 2016-07-01 NOTE — ED Provider Notes (Signed)
[facility] Provider Note   CSN: [id number] Arrival date & time: 07/01/16  1931  By signing my name below, I, Gwenlyn Fudge, attest that this documentation has been prepared under the direction and in the presence of Carlisle Cater, PA-C. Electronically Signed: Gwenlyn Fudge, ED Scribe. 07/01/16. 9:29 PM.   History   Chief Complaint Chief Complaint  Patient presents with  . Leg Pain   The history is provided by the patient. No language interpreter was used.   HPI Comments: Ronald [name] is a 46 y.o. male who presents to the Emergency Department complaining of suddent onset, constant, moderate, burning, sharp right calf pain onset while jogging around 7 PM tonight. Pt reports associated small knot in the right calf. He states he has had similar episode in 11/2015 and the knot self resolved with time. Pt denies PMHx of PE/DVT. He denies recent long car or plane trips.   Past Medical History:  Diagnosis Date  . Hypertension     Patient Active Problem List   Diagnosis Date Noted  . Chest pain 12/15/2015  . Lower leg injury 01/19/2015    Past Surgical History:  Procedure Laterality Date  . arm surgery     torn bicept     Home Medications    Prior to Admission medications   Medication Sig Start Date End Date Taking? Authorizing Provider  fluticasone (FLONASE) 50 MCG/ACT nasal spray Place 2 sprays into both nostrils daily. 06/25/16  Yes Charlesetta Shanks, MD  hydrochlorothiazide (HYDRODIURIL) 25 MG tablet Take 1 tablet (25 mg total) by mouth daily. 04/10/16  Yes Veryl Speak, MD  loratadine-pseudoephedrine (CLARITIN-D 24 HOUR) 10-240 MG 24 hr tablet Take 1 tablet by mouth daily. 06/25/16  Yes Charlesetta Shanks, MD  azithromycin (ZITHROMAX) 250 MG tablet Take 1 tablet (250 mg total) by mouth daily. Take first 2 tablets together, then 1 every day until finished. 04/10/16   Veryl Speak, MD  benzonatate (TESSALON) 100 MG capsule Take 1 capsule (100 mg total) by mouth 3 (three) times  daily as needed for cough. 02/13/16   Sherwood Gambler, MD  Cephalexin (KEFLEX PO) Take by mouth.    Historical Provider, MD  clindamycin (CLEOCIN) 150 MG capsule Take 2 capsules (300 mg total) by mouth 3 (three) times daily. 05/16/16   Margarita Mail, PA-C  fluticasone (FLONASE) 50 MCG/ACT nasal spray Place 2 sprays into both nostrils daily. 05/16/16   Margarita Mail, PA-C  guaifenesin (ROBITUSSIN) 100 MG/5ML syrup Take 5-10 mLs (100-200 mg total) by mouth every 4 (four) hours as needed for cough. 02/13/16   Sherwood Gambler, MD  naproxen (NAPROSYN) 500 MG tablet Take 1 tablet (500 mg total) by mouth 2 (two) times daily with a meal. 05/16/16   Margarita Mail, PA-C  oxymetazoline (AFRIN NASAL SPRAY) 0.05 % nasal spray Place 1 spray into both nostrils 2 (two) times daily. Take for no longer than 4 days 05/16/16   Margarita Mail, PA-C  predniSONE (DELTASONE) 20 MG tablet Take 2 tablets (40 mg total) by mouth daily. 05/16/16   Margarita Mail, PA-C  pseudoephedrine (SUDAFED) 30 MG tablet Take 1 tablet (30 mg total) by mouth every 4 (four) hours as needed for congestion. 02/14/16   Jeannett Senior, PA-C    Family History Family History  Problem Relation Age of Onset  . Hypertension Mother   . Hypertension Father   . Cancer Other     Social History Social History  Substance Use Topics  . Smoking status: Current Every Day Smoker  Packs/day: 0.00    Types: Cigars    Last attempt to quit: 05/11/2015  . Smokeless tobacco: Never Used  . Alcohol use 0.0 oz/week     Comment: occasional     Allergies   Penicillins and Sulfa antibiotics   Review of Systems Review of Systems  Constitutional: Negative for activity change and fever.  Musculoskeletal: Positive for myalgias. Negative for arthralgias, back pain, gait problem, joint swelling and neck pain.  Skin: Negative for wound.  Neurological: Negative for weakness and numbness.   Physical Exam Updated Vital Signs BP (!) 142/107 (BP  Location: Left Arm)   Pulse 75   Temp 98 F (36.7 C) (Oral)   Resp 16   Ht 5\' 9"  (1.753 m)   Wt 190 lb (86.2 kg)   SpO2 99%   BMI 28.06 kg/m   Physical Exam  Constitutional: He appears well-developed and well-nourished.  HENT:  Head: Normocephalic and atraumatic.  Eyes: Conjunctivae are normal.  Neck: Normal range of motion. Neck supple.  Pulmonary/Chest: No respiratory distress.  Musculoskeletal:       Right hip: Normal.       Right knee: Normal.       Right ankle: Normal. Achilles tendon exhibits no pain, no defect and normal Thompson's test results.       Right lower leg: He exhibits tenderness and swelling. He exhibits no bony tenderness and no edema.       Legs:      Right foot: Normal.  Neurological: He is alert.  Skin: Skin is warm and dry.  Psychiatric: He has a normal mood and affect.  Nursing note and vitals reviewed.    ED Treatments / Results  DIAGNOSTIC STUDIES: Oxygen Saturation is 99% on RA, normal by my interpretation.    COORDINATION OF CARE: 9:22 PM Discussed treatment plan with pt at bedside which includes anti-inflammatories and warm compress and pt agreed to plan.  Procedures Procedures (including critical care time)  Medications Ordered in ED Medications - No data to display   Initial Impression / Assessment and Plan / ED Course  I have reviewed the triage vital signs and the nursing notes.  Pertinent labs & imaging results that were available during my care of the patient were reviewed by me and considered in my medical decision making (see chart for details).     Unclear etiology of palpable soft nodule. Given tenderness, may be related to strain, partial tear. Thrombosed varicose vein also possible, however I would expect this to be harder and not acute. Patient counseled on rice protocol, orthopedic follow-up as needed. He was concerned that he has a blood clot. I reassured the patient that this is not a DVT and that he need not worry  about this.  Final Clinical Impressions(s) / ED Diagnoses   Final diagnoses:  Right calf pain   Patient with acute onset of calf pain while running. Achilles is intact. Likely calf strain. Normal function. Lower extremity is neurovascularly intact. Conservative measures indicated.   New Prescriptions Discharge Medication List as of 07/01/2016  9:31 PM       Carlisle Cater, PA-C 07/01/16 [place], MD 07/01/16 [id number]

## 2016-07-01 NOTE — ED Notes (Addendum)
Pain to right calf area while jogging today. Has a "knot" to calf area that is painful when walking. Had a prior episode of the same x 6 months ago and the "knot" resolved .

## 2016-07-01 NOTE — Discharge Instructions (Signed)
Please read and follow all provided instructions.  Your diagnoses today include:  1. Right calf pain    Tests performed today include:  Vital signs. See below for your results today.   Medications prescribed:   None  Take any prescribed medications only as directed.  Home care instructions:   Follow any educational materials contained in this packet  Follow R.I.C.E. Protocol:  R - rest your injury   I  - use ice on injury without applying directly to skin  C - compress injury with bandage or splint  E - elevate the injury as much as possible  Follow-up instructions: Please follow-up with your primary care provider or the provided orthopedic physician (bone specialist) if you continue to have significant pain in 1 week. In this case you may have a more severe injury that requires further care.   Return instructions:   Please return if your toes or feet are numb or tingling, appear gray or blue, or you have severe pain (also elevate the leg and loosen splint or wrap if you were given one)  Please return to the Emergency Department if you experience worsening symptoms.   Please return if you have any other emergent concerns.  Additional Information:  Your vital signs today were: BP (!) 142/107 (BP Location: Left Arm)    Pulse 75    Temp 98 F (36.7 C) (Oral)    Resp 16    Ht 5\' 9"  (1.753 m)    Wt 86.2 kg    SpO2 99%    BMI 28.06 kg/m  If your blood pressure (BP) was elevated above 135/85 this visit, please have this repeated by your doctor within one month. --------------

## 2016-07-09 ENCOUNTER — Emergency Department (HOSPITAL_BASED_OUTPATIENT_CLINIC_OR_DEPARTMENT_OTHER)
Admission: EM | Admit: 2016-07-09 | Discharge: 2016-07-09 | Disposition: A | Discharge status: Home / Self Care | Payer: Self-pay | Attending: Dermatology | Admitting: Dermatology

## 2016-07-09 ENCOUNTER — Encounter (HOSPITAL_BASED_OUTPATIENT_CLINIC_OR_DEPARTMENT_OTHER): Payer: Self-pay | Admitting: *Deleted

## 2016-07-09 DIAGNOSIS — I10 Essential (primary) hypertension: Secondary | ICD-10-CM | POA: Insufficient documentation

## 2016-07-09 DIAGNOSIS — F17290 Nicotine dependence, other tobacco product, uncomplicated: Secondary | ICD-10-CM | POA: Insufficient documentation

## 2016-07-09 DIAGNOSIS — H9202 Otalgia, left ear: Secondary | ICD-10-CM | POA: Insufficient documentation

## 2016-07-09 DIAGNOSIS — Z5321 Procedure and treatment not carried out due to patient leaving prior to being seen by health care provider: Secondary | ICD-10-CM | POA: Insufficient documentation

## 2016-07-09 MED ORDER — IBUPROFEN 400 MG PO TABS
400.0000 mg | ORAL_TABLET | Freq: Once | ORAL | Status: AC
  Administered 2016-07-09: 400 mg via ORAL
  Filled 2016-07-09: qty 1

## 2016-07-09 NOTE — ED Triage Notes (Signed)
Pain in his left ear. He was seen for same a month ago.

## 2016-07-09 NOTE — ED Notes (Signed)
2nd call, also checked vending machines area, no answer.

## 2016-07-10 ENCOUNTER — Encounter (HOSPITAL_COMMUNITY): Payer: Self-pay | Admitting: Emergency Medicine

## 2016-07-10 ENCOUNTER — Emergency Department (HOSPITAL_COMMUNITY)
Admission: EM | Admit: 2016-07-10 | Discharge: 2016-07-11 | Disposition: A | Discharge status: Home / Self Care | Payer: Self-pay | Attending: Emergency Medicine | Admitting: Emergency Medicine

## 2016-07-10 ENCOUNTER — Emergency Department (HOSPITAL_COMMUNITY): Payer: Self-pay

## 2016-07-10 DIAGNOSIS — E870 Hyperosmolality and hypernatremia: Secondary | ICD-10-CM | POA: Insufficient documentation

## 2016-07-10 DIAGNOSIS — I10 Essential (primary) hypertension: Secondary | ICD-10-CM | POA: Insufficient documentation

## 2016-07-10 DIAGNOSIS — H81392 Other peripheral vertigo, left ear: Secondary | ICD-10-CM | POA: Insufficient documentation

## 2016-07-10 DIAGNOSIS — H81399 Other peripheral vertigo, unspecified ear: Secondary | ICD-10-CM

## 2016-07-10 LAB — BASIC METABOLIC PANEL
Anion gap: 10 (ref 5–15)
BUN: 10 mg/dL (ref 6–20)
CO2: 27 mmol/L (ref 22–32)
CREATININE: 1.09 mg/dL (ref 0.61–1.24)
Calcium: 9.3 mg/dL (ref 8.9–10.3)
Chloride: 100 mmol/L — ABNORMAL LOW (ref 101–111)
GFR calc Af Amer: >60 mL/min (ref >60)
Glucose, Bld: 97 mg/dL (ref 65–99)
Potassium: 3.8 mmol/L (ref 3.5–5.1)
Sodium: 137 mmol/L (ref 135–145)

## 2016-07-10 LAB — CBC WITH DIFFERENTIAL/PLATELET
BASOS ABS: 0.1 10*3/uL (ref 0.0–0.1)
Basophils Relative: 1 %
EOS ABS: 0.1 10*3/uL (ref 0.0–0.7)
EOS PCT: 2 %
HCT: 41.9 % (ref 39.0–52.0)
Hemoglobin: 14.4 g/dL (ref 13.0–17.0)
Lymphocytes Relative: 25 %
Lymphs Abs: 2 10*3/uL (ref 0.7–4.0)
MCH: 30 pg (ref 26.0–34.0)
MCHC: 34.4 g/dL (ref 30.0–36.0)
MCV: 87.3 fL (ref 78.0–100.0)
MONO ABS: 0.9 10*3/uL (ref 0.1–1.0)
Monocytes Relative: 11 %
Neutro Abs: 5 10*3/uL (ref 1.7–7.7)
Neutrophils Relative %: 61 %
PLATELETS: 269 10*3/uL (ref 150–400)
RBC: 4.8 MIL/uL (ref 4.22–5.81)
RDW: 13 % (ref 11.5–15.5)
WBC: 8.1 10*3/uL (ref 4.0–10.5)

## 2016-07-10 MED ORDER — MECLIZINE HCL 25 MG PO TABS
25.0000 mg | ORAL_TABLET | Freq: Once | ORAL | Status: AC
  Administered 2016-07-10: 25 mg via ORAL
  Filled 2016-07-10: qty 1

## 2016-07-10 MED ORDER — IBUPROFEN 800 MG PO TABS
800.0000 mg | ORAL_TABLET | Freq: Once | ORAL | Status: AC
  Administered 2016-07-10: 800 mg via ORAL
  Filled 2016-07-10: qty 1

## 2016-07-10 NOTE — ED Provider Notes (Signed)
[facility] DEPT Provider Note   CSN: [id number] Arrival date & time: 07/10/16  1854   By signing my name below, I, Collene Leyden, attest that this documentation has been prepared under the direction and in the presence of Delora Fuel, MD. Electronically Signed: Collene Leyden, Scribe. 07/10/16. 11:20 PM.  History   Chief Complaint Chief Complaint  Patient presents with  . Dizziness  . Otalgia  . Fatigue    HPI Comments: Ronald [name] is a 46 y.o. male with a hx of HTN, who presents to the Emergency Department complaining of sudden-onset, constant dizziness that began 3 days ago. Patient states he feels "foggy, wobbly-like". Patient reports associated rhinorrhea, post-nasal drip, sneezing, coughing, rhinorrhea, "dry throat", unsteady gait when walking, left eye pain, left ear pain that intermittently radiates to the neck, and high blood pressure. Patient reports taking his prescribed medications Claritin-D, ibuprofen, tylenol, ear drops, and nasal spray with minimal relief. Patient was prescribed Zithromax in November, which he states helped with his sinusitis. Patient states his dizziness is worse when waking up, getting out of bed in the morning. States he wakes up and falls back in bed. Patient denies any fever, nausea, vomiting, diarrhea, or abdominal pain.    HPI  Past Medical History:  Diagnosis Date  . Hypertension     Patient Active Problem List   Diagnosis Date Noted  . Chest pain 12/15/2015  . Lower leg injury 01/19/2015    Past Surgical History:  Procedure Laterality Date  . arm surgery     torn bicept       Home Medications    Prior to Admission medications   Medication Sig Start Date End Date Taking? Authorizing Provider  azithromycin (ZITHROMAX) 250 MG tablet Take 1 tablet (250 mg total) by mouth daily. Take first 2 tablets together, then 1 every day until finished. 04/10/16   Veryl Speak, MD  benzonatate (TESSALON) 100 MG capsule Take 1 capsule (100 mg  total) by mouth 3 (three) times daily as needed for cough. 02/13/16   Sherwood Gambler, MD  Cephalexin (KEFLEX PO) Take by mouth.    Historical Provider, MD  clindamycin (CLEOCIN) 150 MG capsule Take 2 capsules (300 mg total) by mouth 3 (three) times daily. 05/16/16   Margarita Mail, PA-C  fluticasone (FLONASE) 50 MCG/ACT nasal spray Place 2 sprays into both nostrils daily. 05/16/16   Margarita Mail, PA-C  fluticasone (FLONASE) 50 MCG/ACT nasal spray Place 2 sprays into both nostrils daily. 06/25/16   Charlesetta Shanks, MD  guaifenesin (ROBITUSSIN) 100 MG/5ML syrup Take 5-10 mLs (100-200 mg total) by mouth every 4 (four) hours as needed for cough. 02/13/16   Sherwood Gambler, MD  hydrochlorothiazide (HYDRODIURIL) 25 MG tablet Take 1 tablet (25 mg total) by mouth daily. 04/10/16   Veryl Speak, MD  loratadine-pseudoephedrine (CLARITIN-D 24 HOUR) 10-240 MG 24 hr tablet Take 1 tablet by mouth daily. 06/25/16   Charlesetta Shanks, MD  naproxen (NAPROSYN) 500 MG tablet Take 1 tablet (500 mg total) by mouth 2 (two) times daily with a meal. 05/16/16   Margarita Mail, PA-C  oxymetazoline (AFRIN NASAL SPRAY) 0.05 % nasal spray Place 1 spray into both nostrils 2 (two) times daily. Take for no longer than 4 days 05/16/16   Margarita Mail, PA-C  predniSONE (DELTASONE) 20 MG tablet Take 2 tablets (40 mg total) by mouth daily. 05/16/16   Margarita Mail, PA-C  pseudoephedrine (SUDAFED) 30 MG tablet Take 1 tablet (30 mg total) by mouth every 4 (four) hours as needed  for congestion. 02/14/16   Jeannett Senior, PA-C    Family History Family History  Problem Relation Age of Onset  . Hypertension Mother   . Hypertension Father   . Cancer Other     Social History Social History  Substance Use Topics  . Smoking status: Current Every Day Smoker    Packs/day: 0.00    Types: Cigars    Last attempt to quit: 05/11/2015  . Smokeless tobacco: Never Used  . Alcohol use 0.0 oz/week     Comment: occasional     Allergies     Penicillins and Sulfa antibiotics   Review of Systems Review of Systems  Constitutional: Positive for chills. Negative for fever.  HENT: Positive for ear pain (left), postnasal drip, rhinorrhea and sneezing.   Respiratory: Positive for cough.   Gastrointestinal: Negative for nausea.  Neurological: Positive for dizziness.  All other systems reviewed and are negative.    Physical Exam Updated Vital Signs BP (!) 157/101   Pulse 73   Temp 99.2 F (37.3 C) (Oral)   Resp 18   Ht 5\' 9"  (1.753 m)   Wt 195 lb (88.5 kg)   SpO2 100%   BMI 28.80 kg/m   Physical Exam  Constitutional: He is oriented to person, place, and time. He appears well-developed and well-nourished.  HENT:  Head: Normocephalic and atraumatic.  Right Ear: External ear normal.  Left Ear: External ear normal.  Mouth/Throat: Oropharynx is clear and moist.  TM's clear. No sinus tenderness. No nasal drainage. No edema of turbinates.   Eyes: EOM are normal. Pupils are equal, round, and reactive to light.  Neck: Normal range of motion. Neck supple. No JVD present.  Cardiovascular: Normal rate, regular rhythm and normal heart sounds.   No murmur heard. Pulmonary/Chest: Effort normal and breath sounds normal. He has no wheezes. He has no rales. He exhibits no tenderness.  Abdominal: Soft. Bowel sounds are normal. He exhibits no distension and no mass. There is no tenderness.  Musculoskeletal: Normal range of motion. He exhibits no edema.  Lymphadenopathy:    He has no cervical adenopathy.  Neurological: He is alert and oriented to person, place, and time. No cranial nerve deficit. He exhibits normal muscle tone. Coordination normal.  Dizziness reproduced by passive head movement. No nystagmus, mild to moderate unsteadiness on Romberg, but does not consistently fall in any one direction.   Skin: Skin is warm and dry. No rash noted.  Psychiatric: He has a normal mood and affect. His behavior is normal. Judgment and  thought content normal.  Nursing note and vitals reviewed.    ED Treatments / Results  DIAGNOSTIC STUDIES: Oxygen Saturation is 100% on RA, normal by my interpretation.    COORDINATION OF CARE: 11:19 PM Discussed treatment plan with pt at bedside and pt agreed to plan, which includes an MRI.   Labs (all labs ordered are listed, but only abnormal results are displayed) Labs Reviewed  BASIC METABOLIC PANEL - Abnormal; Notable for the following:       Result Value   Chloride 100 (*)    All other components within normal limits  CBC WITH DIFFERENTIAL/PLATELET    EKG  EKG Interpretation  Date/Time:  Tuesday July 10 2016 19:44:49 EST Ventricular Rate:  77 PR Interval:  158 QRS Duration: 90 QT Interval:  358 QTC Calculation: 405 R Axis:   72 Text Interpretation:  Normal sinus rhythm T wave abnormality, consider anterolateral ischemia Abnormal ECG When compared with ECG of 12/16/2015,  No significant change was found Confirmed by [facility]  MD, Kimya Mccahill ([id number]) on 07/10/2016 11:11:16 PM       Radiology Mr Brain Wo Contrast  Result Date: 07/11/2016 CLINICAL DATA:  Dizziness for 3 days, LEFT ear pain. Similar symptoms 2 months ago. Malaise. History of hypertension. EXAM: MRI HEAD WITHOUT CONTRAST TECHNIQUE: Multiplanar, multiecho pulse sequences of the brain and surrounding structures were obtained without intravenous contrast. COMPARISON:  None. FINDINGS: BRAIN: No reduced diffusion to suggest acute ischemia. No susceptibility artifact to suggest hemorrhage. The ventricles and sulci are normal for patient's age. A few nonspecific punctate supratentorial white matter FLAIR T2 hyperintensities, normal for age. No suspicious parenchymal signal, masses or mass effect. No abnormal extra-axial fluid collections. VASCULAR: Normal major intracranial vascular flow voids present at skull base. SKULL AND UPPER CERVICAL SPINE: No abnormal sellar expansion. No suspicious calvarial bone marrow signal.  Craniocervical junction maintained. SINUSES/ORBITS: Trace paranasal sinus mucosal thickening. Mastoid air cells are well aerated. The included ocular globes and orbital contents are non-suspicious. OTHER: None. IMPRESSION: Negative MRI head. Electronically Signed   By: Elon Alas M.D.   On: 07/11/2016 00:20    Procedures Procedures (including critical care time)  Medications Ordered in ED Medications  meclizine (ANTIVERT) tablet 25 mg (25 mg Oral Given 07/10/16 2330)  ibuprofen (ADVIL,MOTRIN) tablet 800 mg (800 mg Oral Given 07/10/16 2330)     Initial Impression / Assessment and Plan / ED Course  I have reviewed the triage vital signs and the nursing notes.  Pertinent labs & imaging results that were available during my care of the patient were reviewed by me and considered in my medical decision making (see chart for details).  Dizziness which appears to be peripheral vertigo. He relates being off balance, but no definite cerebellar findings. He is given a dose of meclizine and sent for MRI scan which shows no evidence of posterior circulation stroke or tumor. He feels much better following meclizine. Also, elevated blood pressure is noted. Old records are reviewed and he has been having significant elevated blood pressures at ED visits over the last 5 months. I recommended lisinopril to the patient, but he is worried about angioedema as a side effect. He is given a prescription for metoprolol. Is also given a prescription for meclizine. He is given financial resources to hopefully get him set up with appropriate PCP.  Final Clinical Impressions(s) / ED Diagnoses   Final diagnoses:  Peripheral vertigo, unspecified laterality  Essential hypernatremia    New Prescriptions New Prescriptions   MECLIZINE (ANTIVERT) 25 MG TABLET    Take 1 tablet (25 mg total) by mouth 3 (three) times daily as needed for dizziness.   METOPROLOL (LOPRESSOR) 25 MG TABLET    Take 1 tablet (25 mg total) by  mouth 2 (two) times daily.   I personally performed the services described in this documentation, which was scribed in my presence. The recorded information has been reviewed and is accurate.       Delora Fuel, MD [id number]

## 2016-07-10 NOTE — ED Notes (Signed)
Patient transported to MRI 

## 2016-07-10 NOTE — ED Triage Notes (Signed)
Patient arrives with 3 days of dizziness. States onset of this episode was 3 days ago. Also states that his left ear hurts, and that he feels as though there is a "dry patch" in the back of his throat on the left side. History of similar 2 months ago. Explains that he was not able to get the prescribed medications. Also endorses fatigue: "feeling completely drained and unable to get up in the morning". Patient's gait noted to be unsteady into triage.

## 2016-07-11 MED ORDER — MECLIZINE HCL 25 MG PO TABS: 25.0000 mg | ORAL_TABLET | Freq: Three times a day (TID) | ORAL | 0 refills | Status: DC | PRN

## 2016-07-11 MED ORDER — METOPROLOL TARTRATE 25 MG PO TABS: 25.0000 mg | ORAL_TABLET | Freq: Two times a day (BID) | ORAL | 0 refills | Status: DC

## 2016-07-11 NOTE — Discharge Instructions (Signed)
The prescription for the dizziness (meclizine) is also available without a prescription, and may be cheaper that way.  Please monitor your blood pressure at home.

## 2016-08-22 IMAGING — CR DG CHEST 2V
2 series · 2 of 2 positions shown · non-contrast
Comparison: None.

CLINICAL DATA: Cough with fever and pain

EXAM:
CHEST  2 VIEW

[w chest pa]
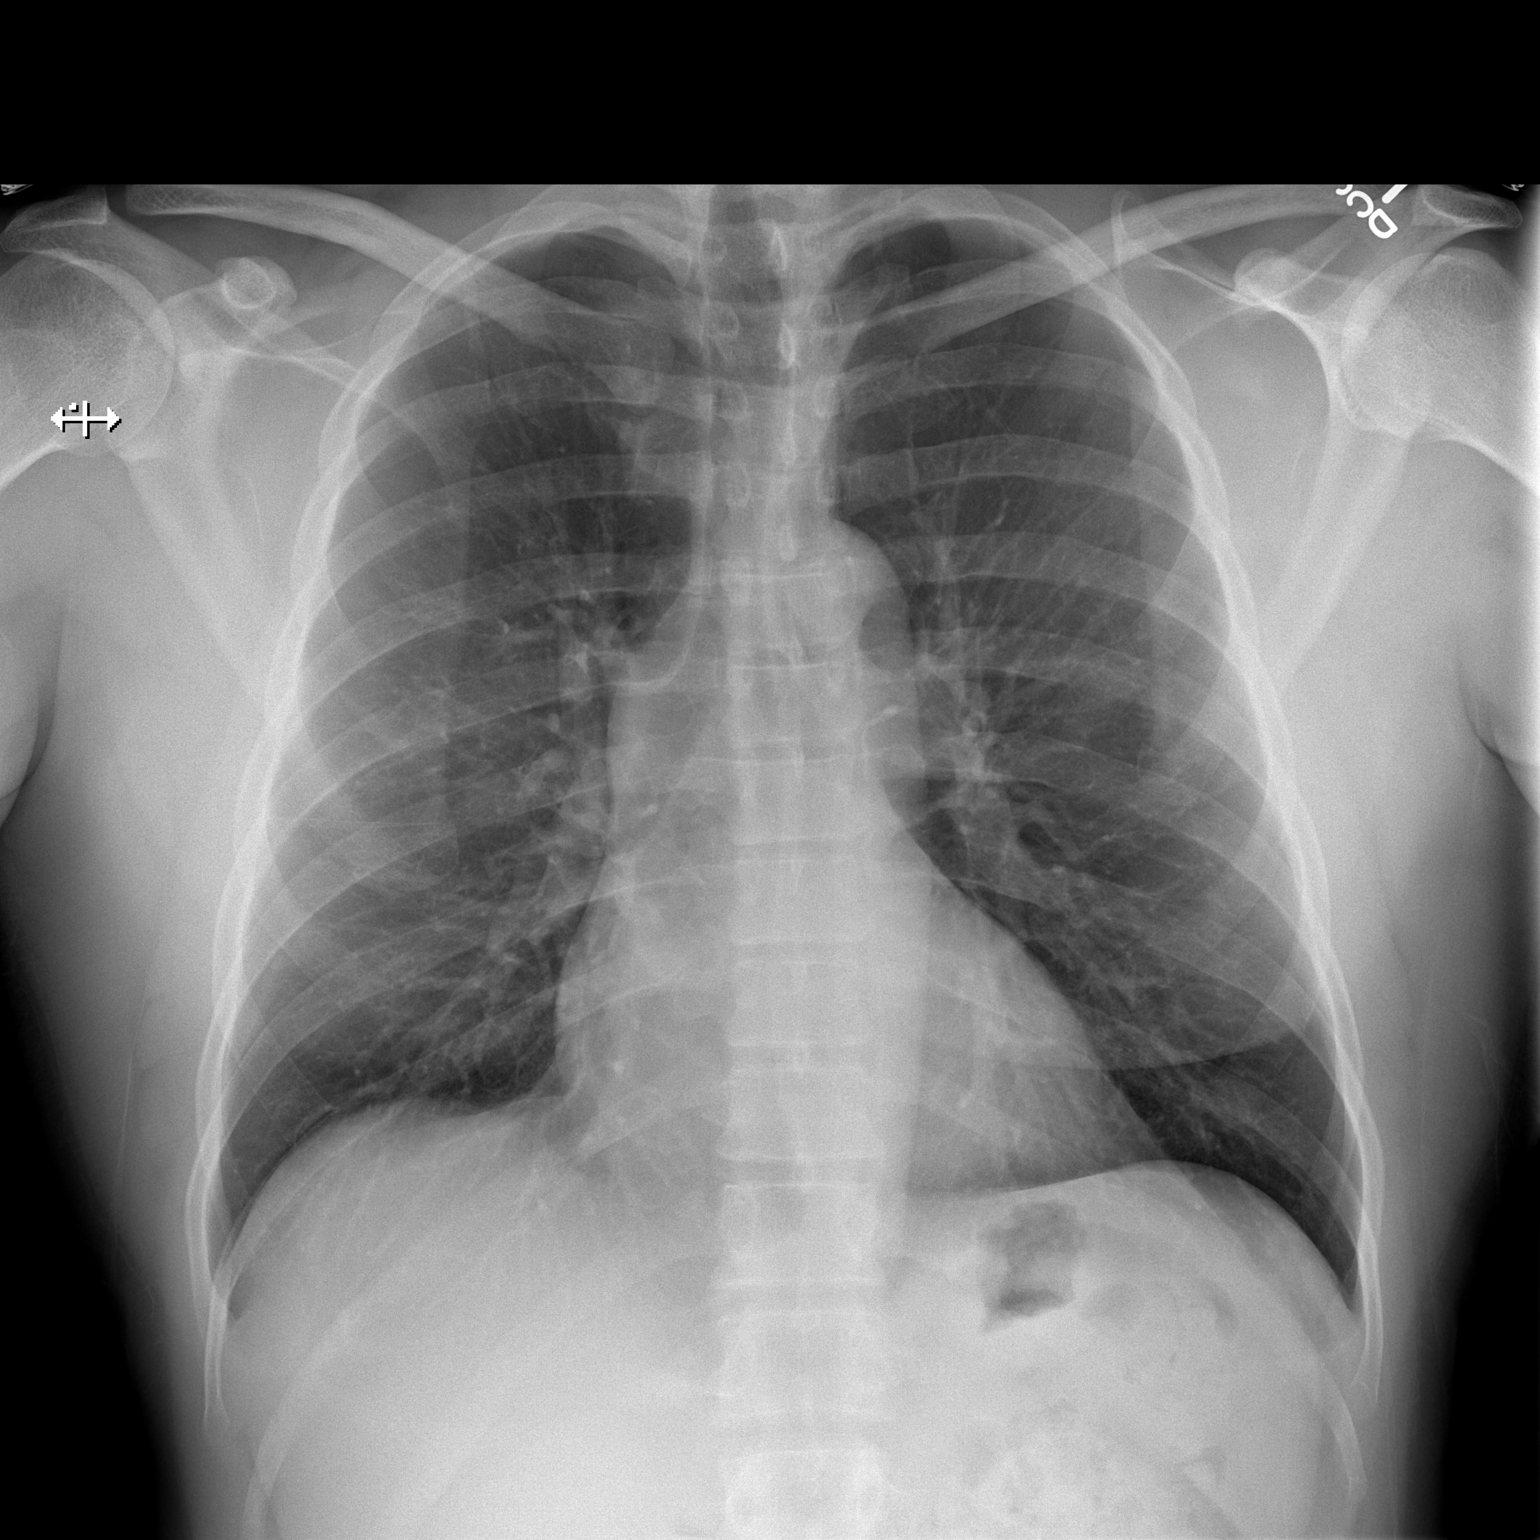

[w chest lat]
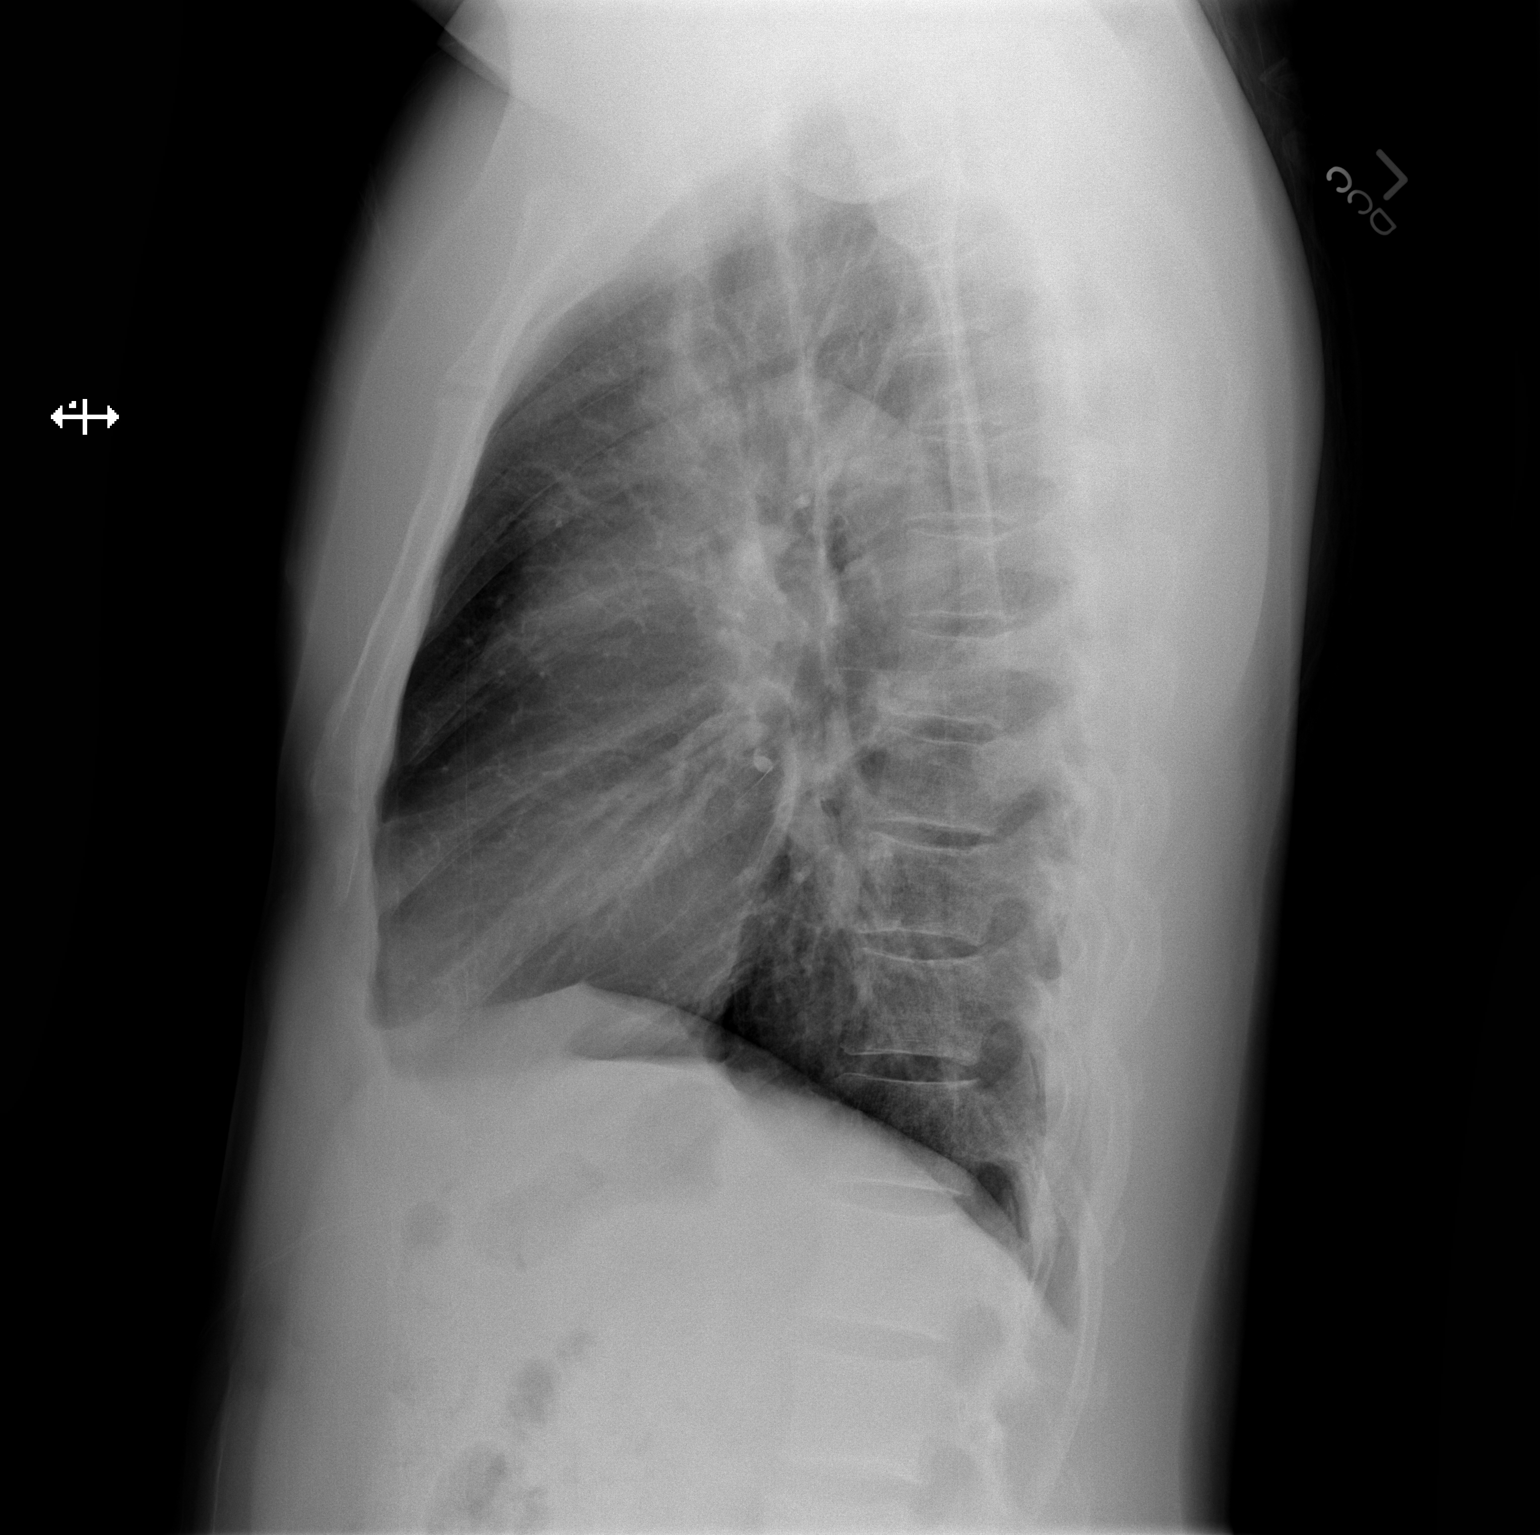

[2 of 2 positions shown; findings below may reference images not displayed]

FINDINGS: Normal heart size and mediastinal contours. No acute infiltrate or
edema. No effusion or pneumothorax. No acute osseous findings.
IMPRESSION: No active cardiopulmonary disease.

## 2017-04-10 ENCOUNTER — Encounter (HOSPITAL_BASED_OUTPATIENT_CLINIC_OR_DEPARTMENT_OTHER): Payer: Self-pay

## 2017-04-10 ENCOUNTER — Emergency Department (HOSPITAL_BASED_OUTPATIENT_CLINIC_OR_DEPARTMENT_OTHER)
Admission: EM | Admit: 2017-04-10 | Discharge: 2017-04-10 | Disposition: A | Discharge status: Home / Self Care | Payer: Self-pay | Attending: Emergency Medicine | Admitting: Emergency Medicine

## 2017-04-10 DIAGNOSIS — I10 Essential (primary) hypertension: Secondary | ICD-10-CM | POA: Insufficient documentation

## 2017-04-10 DIAGNOSIS — F17290 Nicotine dependence, other tobacco product, uncomplicated: Secondary | ICD-10-CM | POA: Insufficient documentation

## 2017-04-10 DIAGNOSIS — J069 Acute upper respiratory infection, unspecified: Secondary | ICD-10-CM | POA: Insufficient documentation

## 2017-04-10 DIAGNOSIS — R0981 Nasal congestion: Secondary | ICD-10-CM | POA: Insufficient documentation

## 2017-04-10 DIAGNOSIS — Z79899 Other long term (current) drug therapy: Secondary | ICD-10-CM | POA: Insufficient documentation

## 2017-04-10 MED ORDER — FLUTICASONE PROPIONATE 50 MCG/ACT NA SUSP: 1.0000 | Freq: Every day | NASAL | 0 refills | Status: DC

## 2017-04-10 MED ORDER — BENZONATATE 100 MG PO CAPS: 100.0000 mg | ORAL_CAPSULE | Freq: Three times a day (TID) | ORAL | 0 refills | Status: DC

## 2017-04-10 MED FILL — BENZONATATE 100 MG CAPSULE: 100 | 7 days supply | Qty: 21 | Fill #0

## 2017-04-10 NOTE — ED Provider Notes (Signed)
[facility] EMERGENCY DEPARTMENT Provider Note   CSN: [id number] Arrival date & time: 04/10/17  0932     History   Chief Complaint Chief Complaint  Patient presents with  . Cough    HPI Ronald [name] is a 46 y.o. male presenting for cough and nasal congestion.  Patient states that for the past 3 days, he has had cough and nasal congestion.  This is worse at night.  He has been using herbal supplements without improvement of symptoms.  His daughter has similar viral symptoms.  He denies fevers, chills, ear pain, eye pain, sore throat, or difficulty breathing.  He states that burns in his chest when he coughs.  Cough is nonproductive.  He states he gets bronchitis this time every year.  He does not take any medications daily.   HPI  Past Medical History:  Diagnosis Date  . Hypertension     Patient Active Problem List   Diagnosis Date Noted  . Chest pain 12/15/2015  . Lower leg injury 01/19/2015    Past Surgical History:  Procedure Laterality Date  . arm surgery     torn bicept       Home Medications    Prior to Admission medications   Medication Sig Start Date End Date Taking? Authorizing Provider  benzonatate (TESSALON) 100 MG capsule Take 1 capsule (100 mg total) every 8 (eight) hours by mouth. 04/10/17   Dariel Betzer, PA-C  fluticasone (FLONASE) 50 MCG/ACT nasal spray Place 2 sprays into both nostrils daily. 06/25/16   Charlesetta Shanks, MD  fluticasone (FLONASE) 50 MCG/ACT nasal spray Place 1 spray daily into both nostrils. 04/10/17   Philander Ake, PA-C  guaifenesin (ROBITUSSIN) 100 MG/5ML syrup Take 5-10 mLs (100-200 mg total) by mouth every 4 (four) hours as needed for cough. 02/13/16   Sherwood Gambler, MD  hydrochlorothiazide (HYDRODIURIL) 25 MG tablet Take 1 tablet (25 mg total) by mouth daily. 04/10/16   Veryl Speak, MD  loratadine-pseudoephedrine (CLARITIN-D 24 HOUR) 10-240 MG 24 hr tablet Take 1 tablet by mouth daily. 06/25/16   Charlesetta Shanks, MD  meclizine (ANTIVERT) 25 MG tablet Take 1 tablet (25 mg total) by mouth 3 (three) times daily as needed for dizziness. 6/71/24   Delora Fuel, MD  metoprolol (LOPRESSOR) 25 MG tablet Take 1 tablet (25 mg total) by mouth 2 (two) times daily. 5/80/99   Delora Fuel, MD    Family History Family History  Problem Relation Age of Onset  . Hypertension Mother   . Hypertension Father   . Cancer Other     Social History Social History   Tobacco Use  . Smoking status: Current Every Day Smoker    Packs/day: 0.00    Types: Cigars    Last attempt to quit: 05/11/2015    Years since quitting: 1.9  . Smokeless tobacco: Never Used  Substance Use Topics  . Alcohol use: Yes    Alcohol/week: 0.0 oz    Comment: occasional  . Drug use: No     Allergies   Penicillins and Sulfa antibiotics   Review of Systems Review of Systems  Constitutional: Negative for chills and fever.  HENT: Positive for congestion. Negative for sore throat and trouble swallowing.   Respiratory: Positive for cough. Negative for shortness of breath.   Gastrointestinal: Negative for abdominal pain, nausea and vomiting.     Physical Exam Updated Vital Signs BP (!) 160/96 (BP Location: Left Arm)   Pulse 78   Temp 98.1 F (36.7 C) (  Oral)   Resp 18   Ht 5\' 9"  (1.753 m)   Wt 85.7 kg (189 lb)   SpO2 99%   BMI 27.91 kg/m   Physical Exam  Constitutional: He is oriented to person, place, and time. He appears well-developed and well-nourished. No distress.  HENT:  Head: Normocephalic and atraumatic.  Right Ear: Tympanic membrane, external ear and ear canal normal.  Left Ear: Tympanic membrane, external ear and ear canal normal.  Nose: Mucosal edema present. Right sinus exhibits maxillary sinus tenderness. Right sinus exhibits no frontal sinus tenderness. Left sinus exhibits maxillary sinus tenderness. Left sinus exhibits no frontal sinus tenderness.  Mouth/Throat: Uvula is midline, oropharynx is clear and  moist and mucous membranes are normal. No tonsillar exudate.  Nasal mucosal edema and mild tenderness palpation of maxillary sinuses.  Eyes: Conjunctivae and EOM are normal. Pupils are equal, round, and reactive to light.  Neck: Normal range of motion.  Cardiovascular: Normal rate, regular rhythm and intact distal pulses.  Pulmonary/Chest: Effort normal and breath sounds normal. He has no decreased breath sounds. He has no wheezes. He has no rhonchi. He has no rales. He exhibits no tenderness.  Pt speaking in full sentences without difficulty.  Clear lung sounds in all fields.  Abdominal: Soft. He exhibits no distension. There is no tenderness.  Musculoskeletal: Normal range of motion.  Lymphadenopathy:    He has no cervical adenopathy.  Neurological: He is alert and oriented to person, place, and time.  Skin: Skin is warm.  Psychiatric: He has a normal mood and affect.  Nursing note and vitals reviewed.    ED Treatments / Results  Labs (all labs ordered are listed, but only abnormal results are displayed) Labs Reviewed - No data to display  EKG  EKG Interpretation None       Radiology No results found.  Procedures Procedures (including critical care time)  Medications Ordered in ED Medications - No data to display   Initial Impression / Assessment and Plan / ED Course  I have reviewed the triage vital signs and the nursing notes.  Pertinent labs & imaging results that were available during my care of the patient were reviewed by me and considered in my medical decision making (see chart for details).     Patient presenting with 3-day history of cough and congestion.  Physical exam shows patient is afebrile, not tachycardic, nontoxic.  Doubt bacterial infection including pneumonia or strep throat.  Likely viral illness.  Discussed symptomatic treatment with Flonase, cough drops, hydration, and NSAIDs.  Patient given option of Decadron shot today, and he declines.  At  this time, patient appears safe for discharge.  Return precautions given.  Patient states he understands and agrees to plan.   Final Clinical Impressions(s) / ED Diagnoses   Final diagnoses:  Upper respiratory tract infection, unspecified type    ED Discharge Orders        Ordered    fluticasone (FLONASE) 50 MCG/ACT nasal spray  Daily     04/10/17 0958    benzonatate (TESSALON) 100 MG capsule  Every 8 hours     04/10/17 0958       Franchot Heidelberg, PA-C 04/10/17 1728    Fredia Sorrow, MD 04/15/17 1740

## 2017-04-10 NOTE — Discharge Instructions (Signed)
You likely have a viral illness.  This should be treated symptomatically. Use Flonase nightly to help with nasal congestion and cough.  Use Tessalon Perles as needed for cough. Make sure that you stay well-hydrated with water. Wash your hands frequently to prevent spread of infection. Return to the emergency room if you develop worsening symptoms, persistent high fevers, difficulty breathing, or any new or concerning symptoms.

## 2017-04-10 NOTE — ED Triage Notes (Signed)
Pt reports productive cough and nasal congestion x 3 days; states he has an episode of this every year when diagnosed with bronchitis.

## 2017-05-15 ENCOUNTER — Emergency Department (HOSPITAL_BASED_OUTPATIENT_CLINIC_OR_DEPARTMENT_OTHER): Payer: Self-pay

## 2017-05-15 ENCOUNTER — Other Ambulatory Visit: Payer: Self-pay

## 2017-05-15 ENCOUNTER — Encounter (HOSPITAL_BASED_OUTPATIENT_CLINIC_OR_DEPARTMENT_OTHER): Payer: Self-pay | Admitting: Emergency Medicine

## 2017-05-15 ENCOUNTER — Emergency Department (HOSPITAL_BASED_OUTPATIENT_CLINIC_OR_DEPARTMENT_OTHER)
Admission: EM | Admit: 2017-05-15 | Discharge: 2017-05-15 | Disposition: A | Discharge status: Home / Self Care | Payer: Self-pay | Attending: Emergency Medicine | Admitting: Emergency Medicine

## 2017-05-15 DIAGNOSIS — I10 Essential (primary) hypertension: Secondary | ICD-10-CM | POA: Insufficient documentation

## 2017-05-15 DIAGNOSIS — Z79899 Other long term (current) drug therapy: Secondary | ICD-10-CM | POA: Insufficient documentation

## 2017-05-15 DIAGNOSIS — M545 Low back pain: Secondary | ICD-10-CM | POA: Insufficient documentation

## 2017-05-15 DIAGNOSIS — F17290 Nicotine dependence, other tobacco product, uncomplicated: Secondary | ICD-10-CM | POA: Insufficient documentation

## 2017-05-15 DIAGNOSIS — M5416 Radiculopathy, lumbar region: Secondary | ICD-10-CM | POA: Insufficient documentation

## 2017-05-15 MED ORDER — KETOROLAC TROMETHAMINE 60 MG/2ML IM SOLN
60.0000 mg | Freq: Once | INTRAMUSCULAR | Status: DC
  Filled 2017-05-15: qty 2

## 2017-05-15 MED ORDER — IBUPROFEN 600 MG PO TABS: 600.0000 mg | ORAL_TABLET | Freq: Three times a day (TID) | ORAL | 0 refills | Status: DC | PRN

## 2017-05-15 MED ORDER — METHOCARBAMOL 500 MG PO TABS: 1000.0000 mg | ORAL_TABLET | Freq: Three times a day (TID) | ORAL | 0 refills | Status: DC | PRN

## 2017-05-15 NOTE — ED Notes (Signed)
Patient transported to X-ray 

## 2017-05-15 NOTE — ED Triage Notes (Signed)
Pt c/o left knee pain since Sunday.  Pt has been working out daily.  Pt states he has burning sensation since Monday.  No other injuries.  Some lower back pain.  No elimination difficulty.  Some weakness noted per pt in left leg.

## 2017-05-15 NOTE — ED Provider Notes (Signed)
[name] Provider Note   CSN: [id number] Arrival date & time: 05/15/17  4132     History   Chief Complaint Chief Complaint  Patient presents with  . Knee Pain  . Back Pain    HPI [name] is a 46 y.o. male.  HPI Patient states he has been working out daily since 09/13/2022.  States he was dead lifting late last week.  Developed left sided knee pain 3 days ago and then noticed a burning sensation radiating down his leg 2 days ago.  He has had some low back discomfort and muscle spasm.  Denies any urinary or bowel incontinence.  States he does have some weakness with flexion of the right hip.  No fever or chills.  No lower extremity swelling.  Past Medical History:  Diagnosis Date  . Hypertension     Patient Active Problem List   Diagnosis Date Noted  . Chest pain 12/15/2015  . Lower leg injury 01/19/2015    Past Surgical History:  Procedure Laterality Date  . arm surgery     torn bicept       Home Medications    Prior to Admission medications   Medication Sig Start Date End Date Taking? Authorizing Provider  fluticasone (FLONASE) 50 MCG/ACT nasal spray Place 2 sprays into both nostrils daily. 06/25/16  Yes Charlesetta Shanks, MD  hydrochlorothiazide (HYDRODIURIL) 25 MG tablet Take 1 tablet (25 mg total) by mouth daily. 04/10/16   Veryl Speak, MD  ibuprofen (ADVIL,MOTRIN) 600 MG tablet Take 1 tablet (600 mg total) by mouth 3 (three) times daily with meals as needed. 05/15/17   Julianne Rice, MD  methocarbamol (ROBAXIN) 500 MG tablet Take 2 tablets (1,000 mg total) by mouth every 8 (eight) hours as needed for muscle spasms. 05/15/17   Julianne Rice, MD    Family History Family History  Problem Relation Age of Onset  . Hypertension Mother   . Hypertension Father   . Cancer Other     Social History Social History   Tobacco Use  . Smoking status: Current Every Day Smoker    Packs/day: 0.00    Types: Cigars    Last  attempt to quit: 05/11/2015    Years since quitting: 2.0  . Smokeless tobacco: Never Used  Substance Use Topics  . Alcohol use: Yes    Alcohol/week: 0.0 oz    Comment: occasional  . Drug use: No     Allergies   Penicillins and Sulfa antibiotics   Review of Systems Review of Systems  Constitutional: Negative for chills and fever.  Cardiovascular: Negative for chest pain.  Gastrointestinal: Negative for abdominal pain, nausea and vomiting.  Genitourinary: Negative for difficulty urinating, dysuria, flank pain and frequency.  Musculoskeletal: Positive for arthralgias, back pain and myalgias. Negative for neck pain.  Skin: Negative for rash and wound.  Neurological: Positive for weakness. Negative for dizziness, light-headedness, numbness and headaches.  All other systems reviewed and are negative.    Physical Exam Updated Vital Signs BP (!) 147/100 (BP Location: Left Arm)   Pulse 63   Temp 97.6 F (36.4 C) (Oral)   Resp 16   SpO2 100%   Physical Exam  Constitutional: He is oriented to person, place, and time. He appears well-developed and well-nourished. No distress.  HENT:  Head: Normocephalic and atraumatic.  Mouth/Throat: Oropharynx is clear and moist.  Eyes: EOM are normal. Pupils are equal, round, and reactive to light.  Neck: Normal range of motion. Neck  supple.  Cardiovascular: Normal rate and regular rhythm.  Pulmonary/Chest: Effort normal and breath sounds normal.  Abdominal: Soft. Bowel sounds are normal. There is no tenderness. There is no rebound and no guarding.  Musculoskeletal: Normal range of motion. He exhibits tenderness. He exhibits no edema.  Patient with full range of motion of the left knee without obvious discomfort.  No effusions present.  No ligamentous instability.  Patient has mild tenderness to palpation over the lateral surface of the left knee.  No midline thoracic or lumbar tenderness.  Patient does have left-sided lumbar paraspinal  tenderness.  Positive straight leg raise.  2+ distal pulses throughout.  No calf swelling or tenderness.  Neurological: He is alert and oriented to person, place, and time.  4/5 motor of left hip flexion.  5/5 motor in all extremities.  Sensory changes to the lateral surface of the left lower extremity at the level of the calf.  Otherwise sensation intact.  Ambulates without difficulty.  Skin: Skin is warm and dry. No rash noted. No erythema.  Psychiatric: He has a normal mood and affect. His behavior is normal.  Nursing note and vitals reviewed.    ED Treatments / Results  Labs (all labs ordered are listed, but only abnormal results are displayed) Labs Reviewed - No data to display  EKG  EKG Interpretation None       Radiology Dg Knee Complete 4 Views Left  Result Date: 05/15/2017 CLINICAL DATA:  Left knee pain. EXAM: LEFT KNEE - COMPLETE 4+ VIEW COMPARISON:  None. FINDINGS: No evidence of acute fracture, dislocation, or joint effusion. No evidence of arthropathy or other focal bone abnormality. Soft tissues are unremarkable. Old proximal fibular fracture is noted. IMPRESSION: No acute abnormality seen in the left knee. Electronically Signed   By: Marijo Conception, M.D.   On: 05/15/2017 10:00    Procedures Procedures (including critical care time)  Medications Ordered in ED Medications  ketorolac (TORADOL) injection 60 mg (60 mg Intramuscular Refused 05/15/17 1025)     Initial Impression / Assessment and Plan / ED Course  I have reviewed the triage vital signs and the nursing notes.  Pertinent labs & imaging results that were available during my care of the patient were reviewed by me and considered in my medical decision making (see chart for details).     X-ray without acute findings.  Symptoms likely due to lumbar radiculopathy.  No evidence of cauda equina.  Will start on anti-inflammatories and muscle relaxants as needed.  Advised to follow-up with sports medicine  should symptoms persist.  Return precautions given.  Final Clinical Impressions(s) / ED Diagnoses   Final diagnoses:  Lumbar radiculopathy, acute    ED Discharge Orders        Ordered    ibuprofen (ADVIL,MOTRIN) 600 MG tablet  3 times daily with meals PRN     05/15/17 1031    methocarbamol (ROBAXIN) 500 MG tablet  Every 8 hours PRN     05/15/17 1031       Julianne Rice, MD 05/15/17 1039

## 2017-05-16 ENCOUNTER — Emergency Department (HOSPITAL_BASED_OUTPATIENT_CLINIC_OR_DEPARTMENT_OTHER)
Admission: EM | Admit: 2017-05-16 | Discharge: 2017-05-16 | Disposition: A | Discharge status: Home / Self Care | Payer: Self-pay | Attending: Emergency Medicine | Admitting: Emergency Medicine

## 2017-05-16 ENCOUNTER — Other Ambulatory Visit: Payer: Self-pay

## 2017-05-16 ENCOUNTER — Encounter (HOSPITAL_BASED_OUTPATIENT_CLINIC_OR_DEPARTMENT_OTHER): Payer: Self-pay

## 2017-05-16 DIAGNOSIS — I10 Essential (primary) hypertension: Secondary | ICD-10-CM | POA: Insufficient documentation

## 2017-05-16 DIAGNOSIS — D171 Benign lipomatous neoplasm of skin and subcutaneous tissue of trunk: Secondary | ICD-10-CM | POA: Insufficient documentation

## 2017-05-16 DIAGNOSIS — R229 Localized swelling, mass and lump, unspecified: Secondary | ICD-10-CM

## 2017-05-16 DIAGNOSIS — Z87891 Personal history of nicotine dependence: Secondary | ICD-10-CM | POA: Insufficient documentation

## 2017-05-16 NOTE — ED Notes (Signed)
Pt discharged to home NAD.  

## 2017-05-16 NOTE — ED Provider Notes (Signed)
[facility] EMERGENCY DEPARTMENT Provider Note   CSN: [id number] Arrival date & time: 05/16/17  2126     History   Chief Complaint Chief Complaint  Patient presents with  . Back Pain    HPI [name] is a 46 y.o. male.  Patient presents with complaint of lump on his back.  Patient was seen yesterday for symptoms suggestive of lumbar radiculopathy after lifting weights.  Patient had a previous soft lump excised from his back approximately 20 years ago.  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  Last night family member was massaging his back and felt a lump near the old incision.  He was concerned that he had a cancerous mass growing in his back.  He presents for recheck.  No pain in that area.  The patient states that the back pain he was having was much improved this morning.  He is able to walk more easily today.  States that he thinks he went back to lifting weights and overdid it.      Past Medical History:  Diagnosis Date  . Hypertension     Patient Active Problem List   Diagnosis Date Noted  . Chest pain 12/15/2015  . Lower leg injury 01/19/2015    Past Surgical History:  Procedure Laterality Date  . arm surgery     torn bicept       Home Medications    Prior to Admission medications   Not on File    Family History Family History  Problem Relation Age of Onset  . Hypertension Mother   . Hypertension Father   . Cancer Other     Social History Social History   Tobacco Use  . Smoking status: Former Smoker    Packs/day: 0.00    Types: Cigars    Last attempt to quit: 05/11/2015    Years since quitting: 2.0  . Smokeless tobacco: Never Used  Substance Use Topics  . Alcohol use: Yes    Alcohol/week: 0.0 oz    Comment: occasional  . Drug use: No     Allergies   Penicillins and Sulfa antibiotics   Review of Systems Review of Systems  Constitutional: Negative for fever.  Musculoskeletal: Negative for back pain.  Skin:  Negative for rash and wound.       nodule     Physical Exam Updated Vital Signs BP (!) 148/106 (BP Location: Left Arm)   Pulse 66   Temp (!) 97.5 F (36.4 C) (Oral)   Resp 16   SpO2 98%   Physical Exam  HENT:  Head: Normocephalic and atraumatic.  Abdominal: Soft. There is no tenderness.  Musculoskeletal: He exhibits no edema or tenderness.  There is a small soft nodule, approximately 1-2 cm diameter, soft consistent with lipoma right over the mid portion of the incision scar.  I palpated around the remainder of the area and back and do not feel any other nodules.  No abscess or infection noted.     ED Treatments / Results   Radiology Dg Knee Complete 4 Views Left  Result Date: 05/15/2017 CLINICAL DATA:  Left knee pain. EXAM: LEFT KNEE - COMPLETE 4+ VIEW COMPARISON:  None. FINDINGS: No evidence of acute fracture, dislocation, or joint effusion. No evidence of arthropathy or other focal bone abnormality. Soft tissues are unremarkable. Old proximal fibular fracture is noted. IMPRESSION: No acute abnormality seen in the left knee. Electronically Signed   By: Marijo Conception, M.D.  On: 05/15/2017 10:00    Procedures Procedures (including critical care time)  Medications Ordered in ED Medications - No data to display   Initial Impression / Assessment and Plan / ED Course  I have reviewed the triage vital signs and the nursing notes.  Pertinent labs & imaging results that were available during my care of the patient were reviewed by me and considered in my medical decision making (see chart for details).     Patient seen and examined.   Vital signs reviewed and are as follows: BP (!) 148/106 (BP Location: Left Arm)   Pulse 66   Temp (!) 97.5 F (36.4 C) (Oral)   Resp 16   SpO2 98%   Patient with a small, likely lipoma, associated with his old incision.  This is not bothersome and is barely noticeable.  I do not feel any other hard nodules or abscesses.  Potentially,  patient had a localized area of muscle spasm which was palpated at the time.  No emergent concerns identified.  Patient encouraged to follow-up with PCP if he notices symptoms again.  Return with any redness or pain.  Patient verbalizes understanding and agrees with plan.  Final Clinical Impressions(s) / ED Diagnoses   Final diagnoses:  Skin nodule    ED Discharge Orders    None       Suann Larry 05/16/17 2214    Tanna Furry, MD 05/18/17 [phone]

## 2017-05-16 NOTE — ED Notes (Signed)
ED Provider at bedside. 

## 2017-05-16 NOTE — ED Triage Notes (Signed)
C/o lower back pain since 12/16-seen here for same yesterday-seen here yesterday for same-states his wife "found a lump on my back yesterday" after ED visit-NAD-steady gait

## 2017-05-16 NOTE — Discharge Instructions (Signed)
Please read and follow all provided instructions.  Your diagnoses today include:  1. Skin nodule    Tests performed today include:  Vital signs. See below for your results today.   Medications prescribed:   None  Home care instructions:  Follow any educational materials contained in this packet.  Follow-up instructions: Please follow-up with your primary care provider as needed for further evaluation of your symptoms.  Return instructions:   Please return to the Emergency Department if you experience worsening symptoms.   Please return if you have any other emergent concerns.  Additional Information:  Your vital signs today were: BP (!) 148/106 (BP Location: Left Arm)    Pulse 66    Temp (!) 97.5 F (36.4 C) (Oral)    Resp 16    SpO2 98%  If your blood pressure (BP) was elevated above 135/85 this visit, please have this repeated by your doctor within one month. ---------------

## 2017-07-11 ENCOUNTER — Other Ambulatory Visit: Payer: Self-pay

## 2017-07-11 ENCOUNTER — Emergency Department (HOSPITAL_BASED_OUTPATIENT_CLINIC_OR_DEPARTMENT_OTHER)
Admission: EM | Admit: 2017-07-11 | Discharge: 2017-07-11 | Disposition: A | Discharge status: Home / Self Care | Payer: Self-pay | Attending: Emergency Medicine | Admitting: Emergency Medicine

## 2017-07-11 ENCOUNTER — Emergency Department (HOSPITAL_COMMUNITY): Payer: Self-pay

## 2017-07-11 ENCOUNTER — Encounter (HOSPITAL_BASED_OUTPATIENT_CLINIC_OR_DEPARTMENT_OTHER): Payer: Self-pay

## 2017-07-11 ENCOUNTER — Encounter (HOSPITAL_COMMUNITY): Payer: Self-pay | Admitting: Emergency Medicine

## 2017-07-11 DIAGNOSIS — Z5321 Procedure and treatment not carried out due to patient leaving prior to being seen by health care provider: Secondary | ICD-10-CM | POA: Insufficient documentation

## 2017-07-11 LAB — CBC
HCT: 43.4 % (ref 39.0–52.0)
Hemoglobin: 15.2 g/dL (ref 13.0–17.0)
MCH: 31.1 pg (ref 26.0–34.0)
MCHC: 35 g/dL (ref 30.0–36.0)
MCV: 88.9 fL (ref 78.0–100.0)
PLATELETS: 272 10*3/uL (ref 150–400)
RBC: 4.88 MIL/uL (ref 4.22–5.81)
RDW: 13.5 % (ref 11.5–15.5)
WBC: 5.6 10*3/uL (ref 4.0–10.5)

## 2017-07-11 LAB — BASIC METABOLIC PANEL
Anion gap: 13 (ref 5–15)
BUN: 21 mg/dL — AB (ref 6–20)
CALCIUM: 9.4 mg/dL (ref 8.9–10.3)
CO2: 22 mmol/L (ref 22–32)
CREATININE: 1.2 mg/dL (ref 0.61–1.24)
Chloride: 103 mmol/L (ref 101–111)
GFR calc non Af Amer: >60 mL/min (ref >60)
GLUCOSE: 111 mg/dL — AB (ref 65–99)
Potassium: 3.4 mmol/L — ABNORMAL LOW (ref 3.5–5.1)
Sodium: 138 mmol/L (ref 135–145)

## 2017-07-11 LAB — I-STAT TROPONIN, ED: TROPONIN I, POC: 0 ng/mL (ref 0.00–0.08)

## 2017-07-11 NOTE — ED Triage Notes (Signed)
Pt states last night around 1am he became nauseated and had chills with some shortness of breath and chest pain  Around 5am he started vomiting and the headache got worse   Today he has been drowsy all day and his chest pain has been intermittent describes as a tightness  Pt state he can swallow his spit fine but when he tries to eat it feels like he is swallowing glass  Pt went to [facility] and left without being seen due to wait time  Pt states he has a hx of HTN but has not been on his meds for a couple months due to not being able to afford them  Pt is supposed to take HCTZ 25mg 

## 2017-07-11 NOTE — ED Triage Notes (Signed)
C/o sore throat, HA day 2-NAD-steady gait

## 2017-07-11 NOTE — ED Notes (Signed)
Pt states he is leaving after being notified of estimated wait time-cont'd-NAD-steady gait

## 2017-07-12 ENCOUNTER — Emergency Department (HOSPITAL_COMMUNITY)
Admission: EM | Admit: 2017-07-12 | Discharge: 2017-07-12 | Discharge status: Against Medical Advice | Payer: Self-pay | Attending: Emergency Medicine | Admitting: Emergency Medicine

## 2017-07-12 NOTE — ED Notes (Signed)
Pt states he has to leave now and left the dept

## 2017-07-12 NOTE — ED Notes (Signed)
Pt states he has children at home and needs to leave  Explained delay to pt  Pt states he will stay until 3am then he will have to leave

## 2017-07-14 ENCOUNTER — Emergency Department (HOSPITAL_COMMUNITY): Admission: EM | Admit: 2017-07-14 | Discharge: 2017-07-14 | Discharge status: Against Medical Advice | Payer: Self-pay

## 2017-07-14 ENCOUNTER — Encounter (HOSPITAL_COMMUNITY): Payer: Self-pay

## 2017-07-14 ENCOUNTER — Other Ambulatory Visit: Payer: Self-pay

## 2017-07-14 NOTE — ED Triage Notes (Signed)
He informs [place] that he has changed his mind and will wait to be seen.

## 2017-07-14 NOTE — ED Triage Notes (Signed)
As I am interviewing him, he becomes infuriated when he finds out our wait time (~ 4 hours); threatens to report me to "hospital administration". He becomes loud and threatening, and is escorted from our property by [employer].

## 2017-09-05 ENCOUNTER — Other Ambulatory Visit: Payer: Self-pay

## 2017-09-05 ENCOUNTER — Encounter (HOSPITAL_COMMUNITY): Payer: Self-pay | Admitting: Emergency Medicine

## 2017-09-05 ENCOUNTER — Emergency Department (HOSPITAL_COMMUNITY)
Admission: EM | Admit: 2017-09-05 | Discharge: 2017-09-05 | Disposition: A | Discharge status: Home / Self Care | Payer: Self-pay | Attending: Emergency Medicine | Admitting: Emergency Medicine

## 2017-09-05 DIAGNOSIS — Y999 Unspecified external cause status: Secondary | ICD-10-CM | POA: Insufficient documentation

## 2017-09-05 DIAGNOSIS — S39012A Strain of muscle, fascia and tendon of lower back, initial encounter: Secondary | ICD-10-CM | POA: Insufficient documentation

## 2017-09-05 DIAGNOSIS — Y929 Unspecified place or not applicable: Secondary | ICD-10-CM | POA: Insufficient documentation

## 2017-09-05 DIAGNOSIS — I10 Essential (primary) hypertension: Secondary | ICD-10-CM | POA: Insufficient documentation

## 2017-09-05 DIAGNOSIS — X503XXA Overexertion from repetitive movements, initial encounter: Secondary | ICD-10-CM | POA: Insufficient documentation

## 2017-09-05 DIAGNOSIS — Y939 Activity, unspecified: Secondary | ICD-10-CM | POA: Insufficient documentation

## 2017-09-05 MED ORDER — CYCLOBENZAPRINE HCL 10 MG PO TABS: 10.0000 mg | ORAL_TABLET | Freq: Two times a day (BID) | ORAL | 0 refills | Status: DC | PRN

## 2017-09-05 MED ORDER — HYDROCHLOROTHIAZIDE 25 MG PO TABS: 25.0000 mg | ORAL_TABLET | Freq: Every day | ORAL | 0 refills | Status: DC

## 2017-09-05 NOTE — ED Provider Notes (Signed)
[facility] DEPT Provider Note   CSN: [id number] Arrival date & time: 09/05/17  1257     History   Chief Complaint Chief Complaint  Patient presents with  . Back Pain    HPI [name] is a 47 y.o. male who presents with back pain.  Past medical history significant for hypertension.  He states that approximately 1 week ago he was working out and to the movement that caused lower back pain.  Over the past week he has had gradually worsening low back pain which radiates to the bilateral groin area.  Bending and sitting for long periods of time makes the pain worse.  Nothing has made the pain better.  He has tried [facility] powders without relief.  He states it feels like the muscles are spasming.  He denies pain radiating down the leg, weakness, numbness or tingling, bowel or bladder incontinence, IV drug use.  Additionally he is requesting a refill of his blood pressure medicine.  He states he took HCTZ 25 mg and knows he needs to follow-up with his doctor.  HPI  Past Medical History:  Diagnosis Date  . Hypertension     Patient Active Problem List   Diagnosis Date Noted  . Chest pain 12/15/2015  . Lower leg injury 01/19/2015    Past Surgical History:  Procedure Laterality Date  . arm surgery     torn bicept        Home Medications    Prior to Admission medications   Medication Sig Start Date End Date Taking? Authorizing Provider  cyclobenzaprine (FLEXERIL) 10 MG tablet Take 1 tablet (10 mg total) by mouth 2 (two) times daily as needed for muscle spasms. 09/05/17   Recardo Evangelist, PA-C  hydrochlorothiazide (HYDRODIURIL) 25 MG tablet Take 1 tablet (25 mg total) by mouth daily. 09/05/17   Recardo Evangelist, PA-C    Family History Family History  Problem Relation Age of Onset  . Hypertension Mother   . Hypertension Father   . Cancer Other     Social History Social History   Tobacco Use  . Smoking status: Former Smoker    Packs/day:  0.00    Types: Cigars    Last attempt to quit: 05/11/2015    Years since quitting: 2.3  . Smokeless tobacco: Never Used  Substance Use Topics  . Alcohol use: Not Currently    Alcohol/week: 0.0 oz    Comment: occasional  . Drug use: No     Allergies   Penicillins and Sulfa antibiotics   Review of Systems Review of Systems  Constitutional: Negative for fever.  Musculoskeletal: Positive for back pain and myalgias.  Neurological: Negative for weakness and numbness.     Physical Exam Updated Vital Signs BP (!) 140/98 (BP Location: Left Arm) Comment: 2 months since he took BP medication  Pulse 68   Temp 98.4 F (36.9 C) (Oral)   Resp 16   Wt 84.8 kg (187 lb)   SpO2 98%   BMI 27.62 kg/m   Physical Exam  Constitutional: He is oriented to person, place, and time. He appears well-developed and well-nourished. No distress.  HENT:  Head: Normocephalic and atraumatic.  Eyes: Pupils are equal, round, and reactive to light. Conjunctivae are normal. Right eye exhibits no discharge. Left eye exhibits no discharge. No scleral icterus.  Neck: Normal range of motion.  Cardiovascular: Normal rate.  Pulmonary/Chest: Effort normal. No respiratory distress.  Abdominal: He exhibits no distension.  Musculoskeletal:  Back:  Inspection: No masses, deformity, or rash Palpation: Lumbar midline tenderness with diffuse low back tenderness.  ROM: Normal flexion, extension, lateral rotation and flexion of back.  Strength: 5/5 in lower extremities and normal plantar and dorsiflexion Sensation: Intact sensation with light touch in lower extremities bilaterally Reflexes: Patellar reflex is 2+ bilaterally SLR: Negative seated straight leg raise Gait: Normal gait   Neurological: He is alert and oriented to person, place, and time.  Skin: Skin is warm and dry.  Psychiatric: He has a normal mood and affect. His behavior is normal.  Nursing note and vitals reviewed.    ED Treatments / Results    Labs (all labs ordered are listed, but only abnormal results are displayed) Labs Reviewed - No data to display  EKG None  Radiology No results found.  Procedures Procedures (including critical care time)  Medications Ordered in ED Medications - No data to display   Initial Impression / Assessment and Plan / ED Course  I have reviewed the triage vital signs and the nursing notes.  Pertinent labs & imaging results that were available during my care of the patient were reviewed by me and considered in my medical decision making (see chart for details).  47 year old male presents with symptoms consistent with lower back strain.  He is mildly hypertensive but otherwise vital signs are normal.  He does not have any signs or symptoms concerning for neurosurgical emergency.  He was given prescription for a muscle relaxer and advised to take NSAIDs and try supportive therapies.  Additionally he is requesting a med refill for his blood pressure medicines.  He was given a 30-day supply and advised to follow-up with his primary doctor.  Final Clinical Impressions(s) / ED Diagnoses   Final diagnoses:  Strain of lumbar region, initial encounter  Hypertension, unspecified type    ED Discharge Orders        Ordered    hydrochlorothiazide (HYDRODIURIL) 25 MG tablet  Daily     09/05/17 1339    cyclobenzaprine (FLEXERIL) 10 MG tablet  2 times daily PRN     09/05/17 1340       Recardo Evangelist, PA-C 09/05/17 1346    Milton Ferguson, MD 09/07/17 [phone]

## 2017-09-05 NOTE — ED Triage Notes (Signed)
Pt reports intermittent low pain and spasms x 1 week. Pt noted a pulling sensation one week ago while in the gym.

## 2017-09-05 NOTE — Discharge Instructions (Addendum)
Take Naproxen (Aleve) or Ibuprofen (Advil) daily for the next week. Take as directed with food. Take muscle relaxer at bedtime to help you sleep. This medicine makes you drowsy so do not take before driving or work Use a heating pad for sore muscles - use for 20 minutes several times a day Return for worsening symptoms

## 2017-09-10 ENCOUNTER — Emergency Department (HOSPITAL_BASED_OUTPATIENT_CLINIC_OR_DEPARTMENT_OTHER): Payer: Self-pay

## 2017-09-10 ENCOUNTER — Encounter (HOSPITAL_BASED_OUTPATIENT_CLINIC_OR_DEPARTMENT_OTHER): Payer: Self-pay | Admitting: Emergency Medicine

## 2017-09-10 ENCOUNTER — Other Ambulatory Visit: Payer: Self-pay

## 2017-09-10 ENCOUNTER — Emergency Department (HOSPITAL_BASED_OUTPATIENT_CLINIC_OR_DEPARTMENT_OTHER)
Admission: EM | Admit: 2017-09-10 | Discharge: 2017-09-10 | Disposition: A | Discharge status: Home / Self Care | Payer: Self-pay | Attending: Emergency Medicine | Admitting: Emergency Medicine

## 2017-09-10 DIAGNOSIS — Z87891 Personal history of nicotine dependence: Secondary | ICD-10-CM | POA: Insufficient documentation

## 2017-09-10 DIAGNOSIS — M5441 Lumbago with sciatica, right side: Secondary | ICD-10-CM | POA: Insufficient documentation

## 2017-09-10 DIAGNOSIS — I10 Essential (primary) hypertension: Secondary | ICD-10-CM | POA: Insufficient documentation

## 2017-09-10 DIAGNOSIS — M5416 Radiculopathy, lumbar region: Secondary | ICD-10-CM | POA: Insufficient documentation

## 2017-09-10 MED ORDER — TRAMADOL HCL 50 MG PO TABS: 50.0000 mg | ORAL_TABLET | Freq: Four times a day (QID) | ORAL | 0 refills | Status: DC | PRN

## 2017-09-10 MED ORDER — METHYLPREDNISOLONE 4 MG PO TBPK: ORAL_TABLET | ORAL | 0 refills | Status: DC

## 2017-09-10 MED ORDER — IBUPROFEN 800 MG PO TABS
800.0000 mg | ORAL_TABLET | Freq: Once | ORAL | Status: AC
Start: 2017-09-10 — End: 2017-09-10
  Administered 2017-09-10: 800 mg via ORAL
  Filled 2017-09-10: qty 1

## 2017-09-10 MED ORDER — NAPROXEN 500 MG PO TABS: 500.0000 mg | ORAL_TABLET | Freq: Two times a day (BID) | ORAL | 0 refills | Status: DC

## 2017-09-10 MED FILL — traMADol HCL 50 MG TABS: 50 | 4 days supply | Qty: 15 | Fill #0

## 2017-09-10 MED FILL — METHYLPREDNISOLONE 4 MG TAB: 4 | 6 days supply | Qty: 21 | Fill #0

## 2017-09-10 NOTE — ED Provider Notes (Signed)
[facility] EMERGENCY DEPARTMENT Provider Note   CSN: [id number] Arrival date & time: 09/10/17  8341     History   Chief Complaint Chief Complaint  Patient presents with  . Back Pain    HPI Ronald [name] is a 47 y.o. male.  Complaint is back pain, not getting better.  HPI 47 year old male.  He works out doing Web designer.  5 days ago he was doing "box jumping" literally deep squats and jumping onto elevated box and then jumping down.  As he jumped down and landed on his feet he felt some pain in his right back.  He continued working out his symptoms worsened.  It became worse over the next few days prompting an ER visit on 413.  Was diagnosed with lumbar strain and given a muscle relaxant.  States he continues to have symptoms from his back to radiate down his right leg to the medial aspect of his right upper leg and inside of his right knee.  No pain that goes below the knee.  No foot drop.  No difficulty with weakness although he states walking is painful.  No difficulty with bowel or bladder.  He has had a bowel movement and urinated today.  He has had no incontinence.  No past history of lumbar spine abnormalities, procedures, imaging.  Past Medical History:  Diagnosis Date  . Hypertension     Patient Active Problem List   Diagnosis Date Noted  . Chest pain 12/15/2015  . Lower leg injury 01/19/2015    Past Surgical History:  Procedure Laterality Date  . arm surgery     torn bicept        Home Medications    Prior to Admission medications   Medication Sig Start Date End Date Taking? Authorizing Provider  cyclobenzaprine (FLEXERIL) 10 MG tablet Take 1 tablet (10 mg total) by mouth 2 (two) times daily as needed for muscle spasms. 09/05/17   Recardo Evangelist, PA-C  hydrochlorothiazide (HYDRODIURIL) 25 MG tablet Take 1 tablet (25 mg total) by mouth daily. 09/05/17   Recardo Evangelist, PA-C  methylPREDNISolone (MEDROL DOSEPAK) 4 MG TBPK tablet 6 po on day 1,  decrease by 1 tab per day 09/10/17   Tanna Furry, MD  naproxen (NAPROSYN) 500 MG tablet Take 1 tablet (500 mg total) by mouth 2 (two) times daily. Do not start naproxen until after your last day of Medrol-steroid. 09/10/17   Tanna Furry, MD  traMADol (ULTRAM) 50 MG tablet Take 1 tablet (50 mg total) by mouth every 6 (six) hours as needed. 09/10/17   Tanna Furry, MD    Family History Family History  Problem Relation Age of Onset  . Hypertension Mother   . Hypertension Father   . Cancer Other     Social History Social History   Tobacco Use  . Smoking status: Former Smoker    Packs/day: 0.00    Types: Cigars    Last attempt to quit: 05/11/2015    Years since quitting: 2.3  . Smokeless tobacco: Never Used  Substance Use Topics  . Alcohol use: Not Currently    Alcohol/week: 0.0 oz    Comment: occasional  . Drug use: No     Allergies   Penicillins and Sulfa antibiotics   Review of Systems Review of Systems  Constitutional: Negative for appetite change, chills, diaphoresis, fatigue and fever.  HENT: Negative for mouth sores, sore throat and trouble swallowing.   Eyes: Negative for visual disturbance.  Respiratory: Negative  for cough, chest tightness, shortness of breath and wheezing.   Cardiovascular: Negative for chest pain.  Gastrointestinal: Negative for abdominal distention, abdominal pain, diarrhea, nausea and vomiting.  Endocrine: Negative for polydipsia, polyphagia and polyuria.  Genitourinary: Negative for dysuria, frequency and hematuria.  Musculoskeletal: Positive for back pain and gait problem.  Skin: Negative for color change, pallor and rash.  Neurological: Negative for dizziness, syncope, light-headedness and headaches.  Hematological: Does not bruise/bleed easily.  Psychiatric/Behavioral: Negative for behavioral problems and confusion.     Physical Exam Updated Vital Signs BP (!) 149/48 (BP Location: Left Arm)   Pulse 68   Temp (!) 97.5 F (36.4 C)  (Oral)   Resp 20   Ht 5\' 9"  (1.753 m)   Wt 84.8 kg (187 lb)   SpO2 100%   BMI 27.62 kg/m   Physical Exam  Constitutional: He is oriented to person, place, and time. He appears well-developed and well-nourished. No distress.  HENT:  Head: Normocephalic.  Eyes: Pupils are equal, round, and reactive to light. Conjunctivae are normal. No scleral icterus.  Neck: Normal range of motion. Neck supple. No thyromegaly present.  Cardiovascular: Normal rate and regular rhythm. Exam reveals no gallop and no friction rub.  No murmur heard. Pulmonary/Chest: Effort normal and breath sounds normal. No respiratory distress. He has no wheezes. He has no rales.  Abdominal: Soft. Bowel sounds are normal. He exhibits no distension. There is no tenderness. There is no rebound.  Musculoskeletal: Normal range of motion.  Tender in the paraspinal musculature adjacent to mid lumbar vertebrae.  No erythema or warmth.  No rash or vesicles.  Neurological: He is alert and oriented to person, place, and time.   Norma symmetric strength to flex/.extend hip and knees, dorsi/plantar flex ankles. Normal symmetric sensation to all distributions to LEs Patellar and achilles reflexes 1-2+. Downgoing Babinski   Skin: Skin is warm and dry. No rash noted.  Psychiatric: He has a normal mood and affect. His behavior is normal.     ED Treatments / Results  Labs (all labs ordered are listed, but only abnormal results are displayed) Labs Reviewed - No data to display  EKG None  Radiology Dg Lumbar Spine Complete  Result Date: 09/10/2017 CLINICAL DATA:  Worsening back pain radiating into the right leg. EXAM: LUMBAR SPINE - COMPLETE 4+ VIEW COMPARISON:  None. FINDINGS: Five lumbar type vertebral bodies. No acute fracture or subluxation. Vertebral body heights are preserved. Alignment is normal. Mild disc height loss at L4-L5 and L5-S1. The sacroiliac joints are intact. IMPRESSION: 1. Mild degenerative disc disease at L4-L5  and L5-S1. Electronically Signed   By: Titus Dubin M.D.   On: 09/10/2017 10:11    Procedures Procedures (including critical care time)  Medications Ordered in ED Medications  ibuprofen (ADVIL,MOTRIN) tablet 800 mg (800 mg Oral Given 09/10/17 1012)     Initial Impression / Assessment and Plan / ED Course  I have reviewed the triage vital signs and the nursing notes.  Pertinent labs & imaging results that were available during my care of the patient were reviewed by me and considered in my medical decision making (see chart for details).     Patient with acute L3 radicular symptoms.  He has intact reflexes and strength and no frank neurological loss.  Plain L-spine films should not show compression fracture.  I discussed with him the possibility of this improving with expectant management.  I think there is a good chance it may.  Of asked  him to avoid marked physical activity including impact.  Will refer him to Dr. Barbaraann Barthel upstairs for sports medicine and outpatient management.  Medrol Dosepak.  Symptom control with Ultram.  Anti-inflammatory after finishing Medrol.  Follow-up with Dr. Barbaraann Barthel.  Given return precautions that would indicate a central herniated nucleus including left-sided symptoms bowel or bladder retention or incontinence weakness foot drop other symptoms.  He expressed understanding of this.  Final Clinical Impressions(s) / ED Diagnoses   Final diagnoses:  Acute right-sided low back pain with right-sided sciatica  Lumbar radiculopathy    ED Discharge Orders        Ordered    methylPREDNISolone (MEDROL DOSEPAK) 4 MG TBPK tablet     09/10/17 1128    naproxen (NAPROSYN) 500 MG tablet  2 times daily     09/10/17 1128    traMADol (ULTRAM) 50 MG tablet  Every 6 hours PRN     09/10/17 1128       Tanna Furry, MD 09/10/17 1140

## 2017-09-10 NOTE — Discharge Instructions (Addendum)
Your symptoms suggest an irritation of the nerve root in your back from a bulged disc. Treatment is with steroids, pain medicine, and anti-inflammatories. Call Dr. Barbaraann Barthel for follow-up appointment. Return to ER with any symptoms suggesting complete herniation: Difficulty with control of your bowel or bladder.  Inability to empty bladder or bowel, left-sided pain, weakness or "foot drop of your right leg.  These would suggest acute herniation and require immediate diagnosis and treatment.

## 2017-09-10 NOTE — ED Triage Notes (Signed)
Reports worsening back pain that radiates into his right lower leg.  Reports he has "groin swelling as well".  Denies dysuria, hematuria, incontinence.  Concerned because seen previously for same and states he did not have an x-ray performed.

## 2017-09-12 ENCOUNTER — Encounter: Payer: Self-pay | Admitting: Family Medicine

## 2017-09-12 ENCOUNTER — Ambulatory Visit (INDEPENDENT_AMBULATORY_CARE_PROVIDER_SITE_OTHER): Payer: Self-pay | Admitting: Family Medicine

## 2017-09-12 DIAGNOSIS — M5440 Lumbago with sciatica, unspecified side: Secondary | ICD-10-CM

## 2017-09-12 MED ORDER — HYDROCODONE-ACETAMINOPHEN 5-325 MG PO TABS: 1.0000 | ORAL_TABLET | Freq: Four times a day (QID) | ORAL | 0 refills | Status: DC | PRN

## 2017-09-12 NOTE — Patient Instructions (Signed)
You have lumbar radiculopathy (a pinched nerve in your low back). Finish your prednisone - day after you finish this you can take aleve 2 tabs twice a day with food for pain and inflammation. Norco as needed for severe pain (no driving on this medicine). Flexeril as needed for muscle spasms (no driving on this medicine if it makes you sleepy). Stay as active as possible. Physical therapy has been shown to be helpful as well. Strengthening of low back muscles, abdominal musculature are key for long term pain relief - pick 3-5 in the handout and follow the instructions. If not improving, will consider further imaging (MRI). Get the cone coverage - this will be important because if you're not getting better we may need to do an MRI or put you in physical therapy. Call me in 1 week to let me know how you're doing.

## 2017-09-14 ENCOUNTER — Encounter: Payer: Self-pay | Admitting: Family Medicine

## 2017-09-14 DIAGNOSIS — M545 Low back pain, unspecified: Secondary | ICD-10-CM | POA: Insufficient documentation

## 2017-09-14 NOTE — Assessment & Plan Note (Signed)
concerning for disc herniation with radiculopathy though exam reassuring.  Finish prednisone.  Flexeril and norco if needed.  Shown home exercises/stretches to do daily.  Get cone coverage and will consider physical therapy, MRI depending on his progress.  Call us in 1 week for an update on his status.

## 2017-09-14 NOTE — Progress Notes (Signed)
PCP: Patient, No Pcp Per  Subjective:   HPI: Patient is a 47 y.o. male here for low back pain.  Patient reports he was doing box jumps on 4/11 when working out. He felt a sharp pain through low back but kept working out. Pain has persisted with 6-7/10 sharp pain more into the right side. Unusual sensation into his pelvis and right thigh. Gets spasms in buttocks. No numbness. No bowel/bladder dysfunction. Taking prednisone, tramadol. Using pool and whirlpool at the gym.  Past Medical History:  Diagnosis Date  . Hypertension     Current Outpatient Medications on File Prior to Visit  Medication Sig Dispense Refill  . cyclobenzaprine (FLEXERIL) 10 MG tablet Take 1 tablet (10 mg total) by mouth 2 (two) times daily as needed for muscle spasms. 20 tablet 0  . hydrochlorothiazide (HYDRODIURIL) 25 MG tablet Take 1 tablet (25 mg total) by mouth daily. 30 tablet 0  . methylPREDNISolone (MEDROL DOSEPAK) 4 MG TBPK tablet 6 po on day 1, decrease by 1 tab per day 21 tablet 0  . naproxen (NAPROSYN) 500 MG tablet Take 1 tablet (500 mg total) by mouth 2 (two) times daily. Do not start naproxen until after your last day of Medrol-steroid. 30 tablet 0   No current facility-administered medications on file prior to visit.     Past Surgical History:  Procedure Laterality Date  . arm surgery     torn bicept    Allergies  Allergen Reactions  . Penicillins Anaphylaxis    Has patient had a PCN reaction causing immediate rash, facial/tongue/throat swelling, SOB or lightheadedness with hypotension: yes Has patient had a PCN reaction causing severe rash involving mucus membranes or skin necrosis: unknown Has patient had a PCN reaction that required hospitalization yes Has patient had a PCN reaction occurring within the last 10 years: no If all of the above answers are "NO", then may proceed with Cephalosporin use. \  . Sulfa Antibiotics Anaphylaxis    Social History   Socioeconomic History  .  Marital status: Divorced    Spouse name: Not on file  . Number of children: 7  . Years of education: Not on file  . Highest education level: Not on file  Occupational History  . Not on file  Social Needs  . Financial resource strain: Not on file  . Food insecurity:    Worry: Not on file    Inability: Not on file  . Transportation needs:    Medical: Not on file    Non-medical: Not on file  Tobacco Use  . Smoking status: Former Smoker    Packs/day: 0.00    Types: Cigars    Last attempt to quit: 05/11/2015    Years since quitting: 2.3  . Smokeless tobacco: Never Used  Substance and Sexual Activity  . Alcohol use: Not Currently    Alcohol/week: 0.0 oz    Comment: occasional  . Drug use: No  . Sexual activity: Not on file  Lifestyle  . Physical activity:    Days per week: Not on file    Minutes per session: Not on file  . Stress: Not on file  Relationships  . Social connections:    Talks on phone: Not on file    Gets together: Not on file    Attends religious service: Not on file    Active member of club or organization: Not on file    Attends meetings of clubs or organizations: Not on file    Relationship  status: Not on file  . Intimate partner violence:    Fear of current or ex partner: Not on file    Emotionally abused: Not on file    Physically abused: Not on file    Forced sexual activity: Not on file  Other Topics Concern  . Not on file  Social History Narrative  . Not on file    Family History  Problem Relation Age of Onset  . Hypertension Mother   . Hypertension Father   . Cancer Other     BP 134/80   Pulse 80   Ht 5\' 9"  (1.753 m)   Wt 187 lb (84.8 kg)   BMI 27.62 kg/m   Review of Systems: See HPI above.     Objective:  Physical Exam:  Gen: NAD, comfortable in exam room  Back: No gross deformity, scoliosis. No TTP.  No midline or bony TTP. FROM with pain on flexion. Strength LEs 5/5 all muscle groups.   2+ MSRs in patellar and achilles  tendons, equal bilaterally. Negative SLRs.  Bilateral hips: No deformity. FROM with 5/5 strength. No tenderness to palpation. NVI distally. Sensation intact to light touch bilaterally. Negative logroll bilateral hips Negative fabers and piriformis stretches.   Assessment & Plan:  1. Low back pain - concerning for disc herniation with radiculopathy though exam reassuring.  Finish prednisone.  Flexeril and norco if needed.  Shown home exercises/stretches to do daily.  Get cone coverage and will consider physical therapy, MRI depending on his progress.  Call us in 1 week for an update on his status.

## 2017-10-21 ENCOUNTER — Emergency Department (HOSPITAL_COMMUNITY)
Admission: EM | Admit: 2017-10-21 | Discharge: 2017-10-21 | Disposition: A | Discharge status: Home / Self Care | Payer: Self-pay | Attending: Emergency Medicine | Admitting: Emergency Medicine

## 2017-10-21 ENCOUNTER — Encounter (HOSPITAL_COMMUNITY): Payer: Self-pay

## 2017-10-21 DIAGNOSIS — D1724 Benign lipomatous neoplasm of skin and subcutaneous tissue of left leg: Secondary | ICD-10-CM | POA: Insufficient documentation

## 2017-10-21 DIAGNOSIS — I10 Essential (primary) hypertension: Secondary | ICD-10-CM | POA: Insufficient documentation

## 2017-10-21 DIAGNOSIS — Z79899 Other long term (current) drug therapy: Secondary | ICD-10-CM | POA: Insufficient documentation

## 2017-10-21 DIAGNOSIS — Z87891 Personal history of nicotine dependence: Secondary | ICD-10-CM | POA: Insufficient documentation

## 2017-10-21 MED ORDER — HYDROCHLOROTHIAZIDE 25 MG PO TABS: 25.0000 mg | ORAL_TABLET | Freq: Every day | ORAL | 0 refills | Status: DC

## 2017-10-21 MED ORDER — HYDROCHLOROTHIAZIDE 25 MG PO TABS
25.0000 mg | ORAL_TABLET | Freq: Once | ORAL | Status: AC
  Administered 2017-10-21: 25 mg via ORAL
  Filled 2017-10-21: qty 1

## 2017-10-21 NOTE — Discharge Instructions (Addendum)
Please read attached information. If you experience any new or worsening signs or symptoms please return to the emergency room for evaluation. Please follow-up with your primary care provider or specialist as discussed. Please use medication prescribed only as directed and discontinue taking if you have any concerning signs or symptoms.   °

## 2017-10-21 NOTE — ED Notes (Signed)
Pt st's he noticed 2 small knots on left lower leg x's 1 month.  Pt denies injury or pain to the area

## 2017-10-21 NOTE — ED Triage Notes (Signed)
Pt presents for evaluation of L lower leg "stinging" since last night. Pt reports painless, soft "lump" as well to L shin.

## 2017-10-21 NOTE — ED Provider Notes (Signed)
Patient placed in Quick Look pathway, seen and evaluated   Chief Complaint: L lower leg pain  HPI:   Patient reports red spot to L lower leg, noticed 2 days ago. Also reports stinging sensation.  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  He is also requesting refill of his hydrochlorothiazide 25 mg which he has been out of for the past 1/2 weeks.  Denies any prior DVT, recent surgeries, recent prolonged travel, hormone use, history of cancer  ROS: L leg pain  Physical Exam:   Gen: No distress  Neuro: Awake and Alert  Skin: Warm    Focused Exam: Small, circular area of erythema on L lower leg. Tender to palpation. No induration or fluctuance noted.  No calf tenderness.  No lower extremity edema.  Area is neurovascularly intact with 2+ DP pulse.   Initiation of care has begun. The patient has been counseled on the process, plan, and necessity for staying for the completion/evaluation, and the remainder of the medical screening examination   Delia Heady, PA-C 10/21/17 1526    Tanna Furry, MD 10/31/17 1134

## 2017-10-21 NOTE — ED Provider Notes (Signed)
[facility] EMERGENCY DEPARTMENT Provider Note   CSN: [id number] Arrival date & time: 10/21/17  1504     History   Chief Complaint Chief Complaint  Patient presents with  . Leg Pain    HPI Ronald [name] is a 47 y.o. male.  HPI   47 year old male presents today with complaints of left leg abnormality.  Patient notes he noticed this week he had to areas of swelling to his left lateral distal leg, he notes there is soft, nontender with no redness or warmth to touch.  Patient notes he had one on his calf last year that has been there since, also notes he had a lipoma on his back when he was younger that he had removed.  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  She does note that yesterday he noted a small circular area of erythema on the left lower leg in addition to that noted above.  Patient denies any other systemic complaints.  He reports that he did run out of his blood pressure medication and has not seen his primary care recently.  He was taking HCTZ, he notes 6 months ago he had laboratory analysis that was reassuring with no significant abnormalities.  Past Medical History:  Diagnosis Date  . Hypertension     Patient Active Problem List   Diagnosis Date Noted  . Low back pain 09/14/2017  . Chest pain 12/15/2015  . Lower leg injury 01/19/2015    Past Surgical History:  Procedure Laterality Date  . arm surgery     torn bicept        Home Medications    Prior to Admission medications   Medication Sig Start Date End Date Taking? Authorizing Provider  cyclobenzaprine (FLEXERIL) 10 MG tablet Take 1 tablet (10 mg total) by mouth 2 (two) times daily as needed for muscle spasms. 09/05/17   Recardo Evangelist, PA-C  hydrochlorothiazide (HYDRODIURIL) 25 MG tablet Take 1 tablet (25 mg total) by mouth daily. 10/21/17   Delores Thelen, Dellis Filbert, PA-C  HYDROcodone-acetaminophen (NORCO) 5-325 MG  tablet Take 1 tablet by mouth every 6 (six) hours as needed for moderate pain. 09/12/17   Hudnall, Sharyn Lull, MD  methylPREDNISolone (MEDROL DOSEPAK) 4 MG TBPK tablet 6 po on day 1, decrease by 1 tab per day 09/10/17   Tanna Furry, MD  naproxen (NAPROSYN) 500 MG tablet Take 1 tablet (500 mg total) by mouth 2 (two) times daily. Do not start naproxen until after your last day of Medrol-steroid. 09/10/17   Tanna Furry, MD    Family History Family History  Problem Relation Age of Onset  . Hypertension Mother   . Hypertension Father   . Cancer Other     Social History Social History   Tobacco Use  . Smoking status: Former Smoker    Packs/day: 0.00    Types: Cigars    Last attempt to quit: 05/11/2015    Years since quitting: 2.4  . Smokeless tobacco: Never Used  Substance Use Topics  . Alcohol use: Not Currently    Alcohol/week: 0.0 oz    Comment: occasional  . Drug use: No     Allergies   Penicillins and Sulfa antibiotics   Review of Systems Review of Systems  All other systems reviewed and are negative.   Physical Exam Updated Vital Signs BP (!) 164/116 (BP Location: Right Arm)   Pulse 68  Temp 97.9 F (36.6 C) (Oral)   Resp 18   SpO2 97%   Physical Exam  Constitutional: He is oriented to person, place, and time. He appears well-developed and well-nourished.  HENT:  Head: Normocephalic and atraumatic.  Eyes: Pupils are equal, round, and reactive to light. Conjunctivae are normal. Right eye exhibits no discharge. Left eye exhibits no discharge. No scleral icterus.  Neck: Normal range of motion. No JVD present. No tracheal deviation present.  Pulmonary/Chest: Effort normal. No stridor.  Musculoskeletal:  To soft areas of swelling to the left lateral thigh, no surrounding redness warmth to touch, nontender no fluctuance-1 small area 1 cm erythema, no central fluctuance  Right posterior calf with 1 cm soft swelling no redness or warmth to touch  Neurological: He is  alert and oriented to person, place, and time. Coordination normal.  Psychiatric: He has a normal mood and affect. His behavior is normal. Judgment and thought content normal.  Nursing note and vitals reviewed.   ED Treatments / Results  Labs (all labs ordered are listed, but only abnormal results are displayed) Labs Reviewed - No data to display  EKG None  Radiology No results found.  Procedures Procedures (including critical care time)  Medications Ordered in ED Medications  hydrochlorothiazide (HYDRODIURIL) tablet 25 mg (25 mg Oral Given 10/21/17 1602)     Initial Impression / Assessment and Plan / ED Course  I have reviewed the triage vital signs and the nursing notes.  Pertinent labs & imaging results that were available during my care of the patient were reviewed by me and considered in my medical decision making (see chart for details).      Final Clinical Impressions(s) / ED Diagnoses   Final diagnoses:  Lipoma of left lower extremity    Labs:   Imaging:  Consults:  Therapeutics: HCTZ  Discharge Meds: HCTZ  Assessment/Plan: 47 year old male presents today with what appears to be lipomas.  Patient has a history of the same, he has one on his right calf.  Patient will be discharged with outpatient follow-up with primary care for surveillance, he is given strict return precautions.  Patient also hypertensive here, asymptomatic, he will be restarted on his HCTZ with outpatient follow-up.  He is given strict return precautions, he verbalized understanding and agreement to today's plan had no further questions or concerns.    ED Discharge Orders        Ordered    hydrochlorothiazide (HYDRODIURIL) 25 MG tablet  Daily     10/21/17 1715       Okey Regal, PA-C 10/21/17 1716    Sherwood Gambler, MD 10/22/17 2238

## 2017-10-23 ENCOUNTER — Emergency Department (HOSPITAL_COMMUNITY): Admission: EM | Admit: 2017-10-23 | Discharge: 2017-10-23 | Discharge status: Against Medical Advice | Payer: Self-pay

## 2017-10-23 NOTE — ED Notes (Signed)
Pt seen walking out of the ED to outside.

## 2017-10-23 NOTE — ED Notes (Signed)
Pt hasn't returned to be seen

## 2017-10-27 ENCOUNTER — Emergency Department (HOSPITAL_BASED_OUTPATIENT_CLINIC_OR_DEPARTMENT_OTHER)
Admission: EM | Admit: 2017-10-27 | Discharge: 2017-10-27 | Disposition: A | Discharge status: Home / Self Care | Payer: Self-pay | Attending: Emergency Medicine | Admitting: Emergency Medicine

## 2017-10-27 ENCOUNTER — Encounter (HOSPITAL_BASED_OUTPATIENT_CLINIC_OR_DEPARTMENT_OTHER): Payer: Self-pay | Admitting: Emergency Medicine

## 2017-10-27 ENCOUNTER — Other Ambulatory Visit: Payer: Self-pay

## 2017-10-27 DIAGNOSIS — R2242 Localized swelling, mass and lump, left lower limb: Secondary | ICD-10-CM | POA: Insufficient documentation

## 2017-10-27 DIAGNOSIS — Z5321 Procedure and treatment not carried out due to patient leaving prior to being seen by health care provider: Secondary | ICD-10-CM | POA: Insufficient documentation

## 2017-10-27 NOTE — ED Notes (Signed)
No response from lobby

## 2017-10-27 NOTE — ED Notes (Signed)
Pt called for room, no response. 

## 2017-10-27 NOTE — ED Triage Notes (Signed)
Pt c/o "painful knots to L calf" since yesterday.

## 2017-10-29 ENCOUNTER — Emergency Department (HOSPITAL_COMMUNITY)
Admission: EM | Admit: 2017-10-29 | Discharge: 2017-10-29 | Disposition: A | Discharge status: Home / Self Care | Payer: Self-pay | Attending: Emergency Medicine | Admitting: Emergency Medicine

## 2017-10-29 ENCOUNTER — Encounter (HOSPITAL_COMMUNITY): Payer: Self-pay | Admitting: Emergency Medicine

## 2017-10-29 DIAGNOSIS — K219 Gastro-esophageal reflux disease without esophagitis: Secondary | ICD-10-CM | POA: Insufficient documentation

## 2017-10-29 DIAGNOSIS — I10 Essential (primary) hypertension: Secondary | ICD-10-CM | POA: Insufficient documentation

## 2017-10-29 DIAGNOSIS — Z87891 Personal history of nicotine dependence: Secondary | ICD-10-CM | POA: Insufficient documentation

## 2017-10-29 DIAGNOSIS — Z79899 Other long term (current) drug therapy: Secondary | ICD-10-CM | POA: Insufficient documentation

## 2017-10-29 DIAGNOSIS — D1724 Benign lipomatous neoplasm of skin and subcutaneous tissue of left leg: Secondary | ICD-10-CM | POA: Insufficient documentation

## 2017-10-29 MED ORDER — PANTOPRAZOLE SODIUM 20 MG PO TBEC: 20.0000 mg | DELAYED_RELEASE_TABLET | Freq: Every day | ORAL | 0 refills | Status: DC

## 2017-10-29 NOTE — Discharge Instructions (Signed)
You may use Protonix or any other OTC medications (Zantac, Prilosec, etc) for heartburn.   I have provided you with some information about reflux and it includes appropriate foods and supportive measures you can do to help alleviate symptoms.  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

## 2017-10-29 NOTE — ED Triage Notes (Signed)
Pt states he has two spots on his outer left lower leg that hurt when he stands up. 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 No obvious bruising or swelling. Pt also states his voice has been groggy/hoarse off and on for about a month. Today when he was swallowing, he felt like he had something in his throat. No SOB.

## 2017-10-29 NOTE — ED Provider Notes (Signed)
[facility] EMERGENCY DEPARTMENT Provider Note  CSN: [id number] Arrival date & time: 10/29/17  0944  History   Chief Complaint Chief Complaint  Patient presents with  . Leg Pain  . Sore Throat    HPI Ronald [name] is a 47 y.o. male with no signifcant medical history who presented to the ED for sore throat and leg pain. Patient has had sore throat and hoarseness x 1 month. Denies recent sick contacts or upper respiratory symptoms. He endorses heartburn a couple of days ago and states that he used to take rantidine scheduled in the past, but has not taken it awhile. Patient endorses a bitter taste in his mouth sometimes. Denies abdominal pain, N/V/D/C, melena/hematochezia, hematemesis, weakness, appetite change and dysphagia.   Patient practices MMA and noticed 2 small bumps on his left lower leg about a week ago. He states they are soft, non-tender and not red. Patient denies recent bites, abrasions or breaks in the skin. He has no other lesions or skin rashes. Denies arthralgias, swelling or warmth at the site.  Past Medical History:  Diagnosis Date  . Hypertension     Patient Active Problem List   Diagnosis Date Noted  . Low back pain 09/14/2017  . Chest pain 12/15/2015  . Lower leg injury 01/19/2015    Past Surgical History:  Procedure Laterality Date  . arm surgery     torn bicept    Home Medications    Prior to Admission medications   Medication Sig Start Date End Date Taking? Authorizing Provider  cyclobenzaprine (FLEXERIL) 10 MG tablet Take 1 tablet (10 mg total) by mouth 2 (two) times daily as needed for muscle spasms. 09/05/17   Recardo Evangelist, PA-C  hydrochlorothiazide (HYDRODIURIL) 25 MG tablet Take 1 tablet (25 mg total) by mouth daily. 10/21/17   Hedges, Dellis Filbert, PA-C  HYDROcodone-acetaminophen (NORCO) 5-325 MG tablet Take 1 tablet by mouth every 6 (six) hours as needed for moderate pain. 09/12/17   Hudnall, Sharyn Lull, MD  methylPREDNISolone (MEDROL  DOSEPAK) 4 MG TBPK tablet 6 po on day 1, decrease by 1 tab per day 09/10/17   Tanna Furry, MD  naproxen (NAPROSYN) 500 MG tablet Take 1 tablet (500 mg total) by mouth 2 (two) times daily. Do not start naproxen until after your last day of Medrol-steroid. 09/10/17   Tanna Furry, MD  pantoprazole (PROTONIX) 20 MG tablet Take 1 tablet (20 mg total) by mouth daily. 10/29/17 11/28/17  Khala Tarte, Jonelle Sports, PA-C    Family History Family History  Problem Relation Age of Onset  . Hypertension Mother   . Hypertension Father   . Cancer Other     Social History Social History   Tobacco Use  . Smoking status: Former Smoker    Packs/day: 0.00    Types: Cigars    Last attempt to quit: 05/11/2015    Years since quitting: 2.4  . Smokeless tobacco: Never Used  Substance Use Topics  . Alcohol use: Not Currently    Alcohol/week: 0.0 oz    Comment: occasional  . Drug use: No     Allergies   Penicillins and Sulfa antibiotics   Review of Systems Review of Systems  Constitutional: Negative for activity change, appetite change, chills and fever.  HENT: Positive for sore throat. Negative for congestion, drooling, postnasal drip, rhinorrhea, sneezing and trouble swallowing.   Eyes: Negative.   Respiratory: Negative for cough and shortness of breath.   Cardiovascular: Negative for chest pain and palpitations.  Gastrointestinal: Negative for abdominal pain, blood in stool, constipation, diarrhea, nausea and vomiting.  Musculoskeletal: Negative for arthralgias, back pain, gait problem, joint swelling and myalgias.  Skin: Negative for rash and wound.  Neurological: Negative for weakness and numbness.     Physical Exam Updated Vital Signs BP (!) 129/96 (BP Location: Right Arm)   Pulse 67   Temp (!) 97.5 F (36.4 C) (Tympanic)   Resp 18   SpO2 100%   Physical Exam  Constitutional: He appears well-developed and well-nourished.  HENT:  Head: Normocephalic and atraumatic.  Mouth/Throat: Uvula is  midline, oropharynx is clear and moist and mucous membranes are normal. No oropharyngeal exudate, posterior oropharyngeal erythema or tonsillar abscesses. No tonsillar exudate.  Eyes: Pupils are equal, round, and reactive to light. Conjunctivae, EOM and lids are normal.  Neck: Trachea normal, normal range of motion, full passive range of motion without pain and phonation normal. Neck supple. Normal carotid pulses present.  Cardiovascular: Normal rate, regular rhythm and normal heart sounds.  No murmur heard. Pulmonary/Chest: Effort normal and breath sounds normal.  Abdominal: Soft. Normal appearance and bowel sounds are normal. There is no tenderness.  Musculoskeletal:       Right knee: Normal.       Left knee: Normal. He exhibits normal range of motion, no swelling, no ecchymosis and no erythema. No tenderness found.       Legs: Neurological: He has normal strength and normal reflexes. No sensory deficit. He exhibits normal muscle tone. Coordination and gait normal.  Skin: Skin is warm, dry and intact. No rash noted. No erythema.  Nursing note and vitals reviewed.    ED Treatments / Results  Labs (all labs ordered are listed, but only abnormal results are displayed) Labs Reviewed - No data to display  EKG None  Radiology No results found.  Procedures Procedures (including critical care time)  Medications Ordered in ED Medications - No data to display   Initial Impression / Assessment and Plan / ED Course  Triage vital signs and the nursing notes have been reviewed.  Pertinent labs & imaging results that were available during care of the patient were reviewed and considered in medical decision making (see chart for details).   Patient presents in no acute distress and with normal vital signs. Physical is unremarkable. Patient is well appearing and denies respiratory s/s that would suggest such an etiology for his sore throat and hoarseness. Given his history, sore throat and  hoarseness most likely due to GERD. No s/s of acute and active blood loss that would require additional evaluation or suggests an ulcer. Patient educated on GERD and appropriate pharm and non-pharm treatment he could use.  Bumps on the leg are lipomas. They are colorless, non-tender to palpation and soft. Patient is able to bear weight and ambulate without issue. Denies paresthesias or gait issues that would warrant imaging or additional evaluation.   Final Clinical Impressions(s) / ED Diagnoses  1. GERD. Protonix QD prescribed. Education provided for supportive 2. Lipoma of Lower Leg. Education provided. Educated that he can follow-up with his PCP or ortho if these get large, becomes bothersome or want them removed for cosmetic reasons.   Dispo: Home. After thorough clinical evaluation, this patient is determined to be medically stable and can be safely discharged with the previously mentioned treatment and/or outpatient follow-up/referral(s). At this time, there are no other apparent medical conditions that require further screening, evaluation or treatment.   Final diagnoses:  Gastroesophageal reflux disease without  esophagitis  Lipoma of left lower extremity    ED Discharge Orders        Ordered    pantoprazole (PROTONIX) 20 MG tablet  Daily     10/29/17 [address] [place], [place] 10/29/17 1209    Quintella Reichert, MD 10/30/17 1556

## 2018-04-16 ENCOUNTER — Encounter (HOSPITAL_BASED_OUTPATIENT_CLINIC_OR_DEPARTMENT_OTHER): Payer: Self-pay | Admitting: *Deleted

## 2018-04-16 ENCOUNTER — Emergency Department (HOSPITAL_BASED_OUTPATIENT_CLINIC_OR_DEPARTMENT_OTHER)
Admission: EM | Admit: 2018-04-16 | Discharge: 2018-04-16 | Disposition: A | Discharge status: Home / Self Care | Payer: Self-pay | Attending: Emergency Medicine | Admitting: Emergency Medicine

## 2018-04-16 ENCOUNTER — Other Ambulatory Visit: Payer: Self-pay

## 2018-04-16 DIAGNOSIS — Y929 Unspecified place or not applicable: Secondary | ICD-10-CM | POA: Insufficient documentation

## 2018-04-16 DIAGNOSIS — Y9389 Activity, other specified: Secondary | ICD-10-CM | POA: Insufficient documentation

## 2018-04-16 DIAGNOSIS — S39012A Strain of muscle, fascia and tendon of lower back, initial encounter: Secondary | ICD-10-CM | POA: Insufficient documentation

## 2018-04-16 DIAGNOSIS — I10 Essential (primary) hypertension: Secondary | ICD-10-CM | POA: Insufficient documentation

## 2018-04-16 DIAGNOSIS — T148XXA Other injury of unspecified body region, initial encounter: Secondary | ICD-10-CM

## 2018-04-16 DIAGNOSIS — Z87891 Personal history of nicotine dependence: Secondary | ICD-10-CM | POA: Insufficient documentation

## 2018-04-16 DIAGNOSIS — X500XXA Overexertion from strenuous movement or load, initial encounter: Secondary | ICD-10-CM | POA: Insufficient documentation

## 2018-04-16 DIAGNOSIS — Y998 Other external cause status: Secondary | ICD-10-CM | POA: Insufficient documentation

## 2018-04-16 DIAGNOSIS — M5441 Lumbago with sciatica, right side: Secondary | ICD-10-CM | POA: Insufficient documentation

## 2018-04-16 MED ORDER — METHOCARBAMOL 500 MG PO TABS: 500.0000 mg | ORAL_TABLET | Freq: Two times a day (BID) | ORAL | 0 refills | Status: DC

## 2018-04-16 MED ORDER — NAPROXEN 375 MG PO TABS: 375.0000 mg | ORAL_TABLET | Freq: Two times a day (BID) | ORAL | 0 refills | Status: DC

## 2018-04-16 MED ORDER — HYDROCHLOROTHIAZIDE 25 MG PO TABS: 25.0000 mg | ORAL_TABLET | Freq: Every day | ORAL | 0 refills | Status: DC

## 2018-04-16 NOTE — ED Notes (Addendum)
Spoke with [place] ( Therapist, sports) in requards  to drug screen needed, [place] cursing on phone , states he doesn't know the answer, and pt doesn't need to be seen and to go home and put ice on his back and  have pt call him.[place] states he was aware of back injury. Pt asked to use phone, number [phone] dialed for pt , supervisor will have to call back with details. Pt told by [place] to not be seen and go home. Pt states he wants to stay and be seen.

## 2018-04-16 NOTE — Discharge Instructions (Signed)
Take Naprosyn as directed.   Take Robaxin as prescribed. This medication will make you drowsy so do not drive or drink alcohol when taking it.  Apply heat to the affected area.   Make sure you are doing stretches to help with muscle tension.   Follow-up with the referred [facility] to establish primary care doctor.  Return to the Emergency Department immediately for any worsening back pain, neck pain, difficulty walking, numbness/weaknss of your arms or legs, urinary or bowel accidents, fever or any other worsening or concerning symptoms.

## 2018-04-16 NOTE — ED Notes (Signed)
ED Provider at bedside. 

## 2018-04-16 NOTE — ED Notes (Signed)
Pt states he was lifting a heavy bag while at work and had a sudden onset of R lumbar that radiated down buttock and thigh. Worse with movement. Pt noted to have a slow but steady gait.

## 2018-04-16 NOTE — ED Triage Notes (Signed)
Pt c/o lower right back pain x 12 hr ago at work . Pt states workers comp.

## 2018-04-16 NOTE — ED Provider Notes (Signed)
[facility]GENC[facility] DEPARTMENT Provider Note   CSN: [id number] Arrival date & time: 04/16/18  1924     History   Chief Complaint Chief Complaint  Patient presents with  . Back Injury    HPI Ronald [name] is a 47 y.o. male history of hypertension who presents for evaluation of lower back pain, right greater than left that began yesterday.  Patient reports that he was at work last night and reports that he did a lot of heavy lifting that required him to bend down frequently.  He reports that at some point during the night, he bent down and still having pain in the right lower back.  He states he did not fall or have any other injury.  He states since then he has been having pain to the right lower back that radiates to the posterior aspect of his right leg.  Pain is worse with bending and with movement. Denies fevers, weight loss, numbness/weakness of upper and lower extremities, bowel/bladder incontinence, saddle anesthesia, history of back surgery, history of IVDA.   The history is provided by the patient.    Past Medical History:  Diagnosis Date  . Hypertension     Patient Active Problem List   Diagnosis Date Noted  . Low back pain 09/14/2017  . Chest pain 12/15/2015  . Lower leg injury 01/19/2015    Past Surgical History:  Procedure Laterality Date  . arm surgery     torn bicept        Home Medications    Prior to Admission medications   Medication Sig Start Date End Date Taking? Authorizing Provider  hydrochlorothiazide (HYDRODIURIL) 25 MG tablet Take 1 tablet (25 mg total) by mouth daily. 04/16/18   Volanda Napoleon, PA-C  methocarbamol (ROBAXIN) 500 MG tablet Take 1 tablet (500 mg total) by mouth 2 (two) times daily. 04/16/18   Volanda Napoleon, PA-C  naproxen (NAPROSYN) 375 MG tablet Take 1 tablet (375 mg total) by mouth 2 (two) times daily. 04/16/18   Volanda Napoleon, PA-C  pantoprazole (PROTONIX) 20 MG tablet Take 1 tablet (20 mg total) by mouth  daily. 10/29/17 11/28/17  Mortis, Jonelle Sports, PA-C    Family History Family History  Problem Relation Age of Onset  . Hypertension Mother   . Hypertension Father   . Cancer Other     Social History Social History   Tobacco Use  . Smoking status: Former Smoker    Packs/day: 0.00    Types: Cigars    Last attempt to quit: 05/11/2015    Years since quitting: 2.9  . Smokeless tobacco: Never Used  Substance Use Topics  . Alcohol use: Not Currently    Alcohol/week: 0.0 standard drinks    Comment: occasional  . Drug use: No     Allergies   Penicillins and Sulfa antibiotics   Review of Systems Review of Systems  Constitutional: Negative for fever.  Musculoskeletal: Positive for back pain. Negative for neck pain.  Neurological: Negative for weakness and numbness.  All other systems reviewed and are negative.    Physical Exam Updated Vital Signs BP (!) 149/96 (BP Location: Right Arm)   Pulse 75   Temp 97.9 F (36.6 C) (Oral)   Resp 16   Ht 5\' 9"  (1.753 m)   Wt 83.9 kg   SpO2 100%   BMI 27.31 kg/m   Physical Exam  Constitutional: He appears well-developed and well-nourished.  HENT:  Head: Normocephalic and atraumatic.  Eyes: Conjunctivae  and EOM are normal. Right eye exhibits no discharge. Left eye exhibits no discharge. No scleral icterus.  Neck: Full passive range of motion without pain.  Full flexion/extension and lateral movement of neck fully intact. No bony midline tenderness. No deformities or crepitus.   Pulmonary/Chest: Effort normal.  Musculoskeletal:       Thoracic back: He exhibits no tenderness.       Back:  No midline C, T-spine tenderness.  No deformity or crepitus noted.  Diffuse rightparaspinal tenderness of the lumbar region that extends over to the midline.  No deformity or crepitus noted.  Neurological: He is alert.  Follows commands, Moves all extremities  5/5 strength to BUE and BLE  Sensation intact throughout all major nerve  distributions Positive straight leg raise on the right.  Skin: Skin is warm and dry.  Psychiatric: He has a normal mood and affect. His speech is normal and behavior is normal.  Nursing note and vitals reviewed.    ED Treatments / Results  Labs (all labs ordered are listed, but only abnormal results are displayed) Labs Reviewed - No data to display  EKG None  Radiology No results found.  Procedures Procedures (including critical care time)  Medications Ordered in ED Medications - No data to display   Initial Impression / Assessment and Plan / ED Course  I have reviewed the triage vital signs and the nursing notes.  Pertinent labs & imaging results that were available during my care of the patient were reviewed by me and considered in my medical decision making (see chart for details).     47 year old male who presents for evaluation of right-sided back pain that began last night.  Reports he was heavy lifting at work prior to onset of pain.  No fall, other trauma, injury.  Reports pain radiates down the right leg. Patient is afebrile, non-toxic appearing, sitting comfortably on examination table. Vital signs reviewed and stable. No neuro deficits noted on exam.  On exam, patient with tenderness palpation of the right-sided paraspinal muscles of the lumbar region that extends to the midline.  Consider muscle strain versus sciatica.  Do not suspect acute back fracture.  Additionally, history/physical exam is not concerning for spinal abscess, cauda equina.  Offered x-ray imaging for further evaluation but patient declined at this time.  We will plan to treat with supportive at home therapies.  Patient encouraged to do at home supportive care measures.  Patient also requesting refills on his blood pressure medication.  He reports he has not established primary care doctor.  Patient had previously been on hydrochlorothiazide.  We will plan to give short course of his blood pressure  medications.  Patient provided with outpatient [facility] to follow-up and establish primary care. At this time, patient exhibits no emergent life-threatening condition that require further evaluation in ED. Patient had ample opportunity for questions and discussion. All patient's questions were answered with full understanding. Strict return precautions discussed. Patient expresses understanding and agreement to plan.   Final Clinical Impressions(s) / ED Diagnoses   Final diagnoses:  Acute right-sided low back pain with right-sided sciatica  Muscle strain    ED Discharge Orders         Ordered    hydrochlorothiazide (HYDRODIURIL) 25 MG tablet  Daily     04/16/18 2122    methocarbamol (ROBAXIN) 500 MG tablet  2 times daily     04/16/18 2122    naproxen (NAPROSYN) 375 MG tablet  2 times daily,  Status:  Discontinued     04/16/18 2122    naproxen (NAPROSYN) 375 MG tablet  2 times daily     04/16/18 2123           Desma Mcgregor 04/16/18 2340    Veryl Speak, MD 04/16/18 [phone]

## 2018-04-16 NOTE — ED Notes (Signed)
Attempted to call supervisor Barbaraann Rondo / Raynelle Highland , [phone] no answer and no voicemail

## 2018-05-01 ENCOUNTER — Emergency Department (HOSPITAL_BASED_OUTPATIENT_CLINIC_OR_DEPARTMENT_OTHER): Payer: Self-pay

## 2018-05-01 ENCOUNTER — Encounter (HOSPITAL_BASED_OUTPATIENT_CLINIC_OR_DEPARTMENT_OTHER): Payer: Self-pay

## 2018-05-01 ENCOUNTER — Emergency Department (HOSPITAL_BASED_OUTPATIENT_CLINIC_OR_DEPARTMENT_OTHER)
Admission: EM | Admit: 2018-05-01 | Discharge: 2018-05-01 | Disposition: A | Discharge status: Home / Self Care | Payer: Self-pay | Attending: Emergency Medicine | Admitting: Emergency Medicine

## 2018-05-01 ENCOUNTER — Other Ambulatory Visit: Payer: Self-pay

## 2018-05-01 DIAGNOSIS — M5440 Lumbago with sciatica, unspecified side: Secondary | ICD-10-CM | POA: Insufficient documentation

## 2018-05-01 DIAGNOSIS — Z87891 Personal history of nicotine dependence: Secondary | ICD-10-CM | POA: Insufficient documentation

## 2018-05-01 DIAGNOSIS — I10 Essential (primary) hypertension: Secondary | ICD-10-CM | POA: Insufficient documentation

## 2018-05-01 MED ORDER — ACETAMINOPHEN 325 MG PO TABS
650.0000 mg | ORAL_TABLET | Freq: Once | ORAL | Status: AC
  Administered 2018-05-01: 650 mg via ORAL
  Filled 2018-05-01: qty 2

## 2018-05-01 MED ORDER — PREDNISONE 20 MG PO TABS: 40.0000 mg | ORAL_TABLET | Freq: Every day | ORAL | 0 refills | Status: AC

## 2018-05-01 MED ORDER — PREDNISONE 20 MG PO TABS: 40.0000 mg | ORAL_TABLET | Freq: Every day | ORAL | 0 refills | Status: DC

## 2018-05-01 MED ORDER — METHOCARBAMOL 500 MG PO TABS: 500.0000 mg | ORAL_TABLET | Freq: Two times a day (BID) | ORAL | 0 refills | Status: DC

## 2018-05-01 MED ORDER — METHOCARBAMOL 500 MG PO TABS: 500.0000 mg | ORAL_TABLET | Freq: Two times a day (BID) | ORAL | 0 refills | Status: AC

## 2018-05-01 MED FILL — predniSONE 20 MG TABS: 20 | 5 days supply | Qty: 10 | Fill #0

## 2018-05-01 NOTE — ED Triage Notes (Signed)
Pt c/o cont'd lower back pain from work injury 11/20-NAD-slow gait

## 2018-05-01 NOTE — Discharge Instructions (Addendum)
I have given a referral for an orthopedist, please schedule an appointment for further management of your back pain.  Also provided you with some steroids to help with this pain please take them as advised.   This medication can cause insomnia, appetite changes and flushness.

## 2018-05-01 NOTE — ED Notes (Signed)
Pt left treatment room prior to receiving d/c paperwork and updated vitals. Papers given to nurse first to review with patient prior to pharmacy.

## 2018-05-01 NOTE — ED Provider Notes (Signed)
[place] EMERGENCY DEPARTMENT Provider Note   CSN: [id number] Arrival date & time: 05/01/18  1504     History   Chief Complaint Chief Complaint  Patient presents with  . Back Pain    HPI Ronald [name] is a 47 y.o. male.  47 y.o male with a PMH of HTN (non compliant with medication) presents to the ED with a chief complaint of back pain x 3 weeks. Patient had an area at work 3 weeks ago when lifting a heavy object and reports he was seen in the ED for his lower back pain, he was given Robaxin, naproxen to help with the symptoms but patient states no relieve at this time.  He reports today he picked up his son and was tying his shoes when he felt a sharp pain in the lower end of his back which felt like a shooting pain radiating from the middle to the left side into the buttocks region.  Has also been trying salonpas patches ports they are not helping either.  I have personally reviewed patient's chart and see he was offered imaging last visit but he declined due to believing this was a pulled muscle, he is currently requesting an x-ray today.  He denies any Rosanne Gutting retention, bowel incontinence, fever or leg weakness.     Past Medical History:  Diagnosis Date  . Hypertension     Patient Active Problem List   Diagnosis Date Noted  . Low back pain 09/14/2017  . Chest pain 12/15/2015  . Lower leg injury 01/19/2015    Past Surgical History:  Procedure Laterality Date  . arm surgery     torn bicept        Home Medications    Prior to Admission medications   Medication Sig Start Date End Date Taking? Authorizing Provider  hydrochlorothiazide (HYDRODIURIL) 25 MG tablet Take 1 tablet (25 mg total) by mouth daily. 04/16/18   Volanda Napoleon, PA-C  methocarbamol (ROBAXIN) 500 MG tablet Take 1 tablet (500 mg total) by mouth 2 (two) times daily for 7 days. 05/01/18 05/08/18  Janeece Fitting, PA-C  naproxen (NAPROSYN) 375 MG tablet Take 1 tablet (375 mg total) by mouth 2  (two) times daily. 04/16/18   Volanda Napoleon, PA-C  pantoprazole (PROTONIX) 20 MG tablet Take 1 tablet (20 mg total) by mouth daily. 10/29/17 11/28/17  Mortis, Alvie [place] I, PA-C  predniSONE (DELTASONE) 20 MG tablet Take 2 tablets (40 mg total) by mouth daily for 5 days. 05/01/18 05/06/18  Janeece Fitting, PA-C    Family History Family History  Problem Relation Age of Onset  . Hypertension Mother   . Hypertension Father   . Cancer Other     Social History Social History   Tobacco Use  . Smoking status: Former Smoker    Packs/day: 0.00    Types: Cigars    Last attempt to quit: 05/11/2015    Years since quitting: 2.9  . Smokeless tobacco: Never Used  Substance Use Topics  . Alcohol use: Not Currently    Alcohol/week: 0.0 standard drinks  . Drug use: No     Allergies   Penicillins and Sulfa antibiotics   Review of Systems Review of Systems  Constitutional: Negative for fever.  HENT: Negative for sore throat.   Respiratory: Negative for shortness of breath.   Cardiovascular: Negative for chest pain.  Gastrointestinal: Negative for abdominal pain and nausea.  Genitourinary: Negative for discharge, dysuria and flank pain.  Musculoskeletal: Positive for back pain.  Neurological: Negative for light-headedness and headaches.     Physical Exam Updated Vital Signs BP (!) 147/85 (BP Location: Left Arm)   Pulse (!) 58   Temp 97.9 F (36.6 C) (Oral)   Resp 18   Ht 5\' 9"  (1.753 m)   Wt 87.1 kg   SpO2 100%   BMI 28.35 kg/m   Physical Exam  Constitutional: He is oriented to person, place, and time. He appears well-developed and well-nourished.  HENT:  Head: Normocephalic and atraumatic.  Neck: Normal range of motion. Neck supple.  Cardiovascular: Normal heart sounds.  Pulmonary/Chest: Effort normal and breath sounds normal. No respiratory distress. He has no wheezes.  Abdominal: Soft. He exhibits no mass. There is no tenderness. There is no guarding.  Musculoskeletal: He  exhibits tenderness.       Lumbar back: He exhibits pain and spasm.       Back:  Neurological: He is alert and oriented to person, place, and time.  Skin: Skin is warm and dry.  Nursing note and vitals reviewed.    ED Treatments / Results  Labs (all labs ordered are listed, but only abnormal results are displayed) Labs Reviewed - No data to display  EKG None  Radiology Dg Lumbar Spine Complete  Result Date: 05/01/2018 CLINICAL DATA:  Low back pain since a lifting injury on 04/16/2018. EXAM: LUMBAR SPINE - COMPLETE 4+ VIEW COMPARISON:  09/10/2017. 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 Counting purposes, the last open disc space is labeled the L5-S1 level. Mild facet degenerative changes at the L4-5 and L5-S1 levels. No fractures, pars defects or subluxations are seen. IMPRESSION: Mild facet degenerative changes at the L4-5 and L5-S1 levels. No acute abnormality. Electronically Signed   By: Claudie Revering M.D.   On: 05/01/2018 16:22    Procedures Procedures (including critical care time)  Medications Ordered in ED Medications  acetaminophen (TYLENOL) tablet 650 mg (650 mg Oral Given 05/01/18 1605)     Initial Impression / Assessment and Plan / ED Course  I have reviewed the triage vital signs and the nursing notes.  Pertinent labs & imaging results that were available during my care of the patient were reviewed by me and considered in my medical decision making (see chart for details).    Patient presents with current back pain which began after an injury at work.  Patient was prescribed Robaxin, naproxen but states no relieving symptoms.  According to my colleagues note from his last visit he aligned a lumbar x-ray on his last visit.  Today he reports his pain has not improved with the medication.  Requesting for a lumbar x-ray.  DG lumbar spine showed:  Mild facet degenerative changes at the L4-5 and L5-S1 levels. No    acute abnormality.   Refer patient to Ortho, for further management of his back pain at this time.  I will also place him on steroids to help with his symptoms.  Was provided with Tylenol in the ED.  Ports still having some Robaxin at home.  Bowel or bladder incontinence no trauma aside from his work injury.  No further urgent work-up at this time.  Final Clinical Impressions(s) / ED Diagnoses   Final diagnoses:  Acute bilateral low back pain with sciatica, sciatica laterality unspecified    ED Discharge Orders         Ordered    predniSONE (DELTASONE) 20 MG tablet  Daily     05/01/18 1657    methocarbamol (  ROBAXIN) 500 MG tablet  2 times daily     05/01/18 1700           Janeece Fitting, [place] 05/01/18 1700    Mesner, Corene Cornea, MD 05/02/18 [phone]

## 2018-05-04 ENCOUNTER — Other Ambulatory Visit: Payer: Self-pay

## 2018-05-04 ENCOUNTER — Emergency Department (HOSPITAL_BASED_OUTPATIENT_CLINIC_OR_DEPARTMENT_OTHER)
Admission: EM | Admit: 2018-05-04 | Discharge: 2018-05-04 | Disposition: A | Discharge status: Home / Self Care | Payer: Self-pay | Attending: Emergency Medicine | Admitting: Emergency Medicine

## 2018-05-04 ENCOUNTER — Encounter (HOSPITAL_BASED_OUTPATIENT_CLINIC_OR_DEPARTMENT_OTHER): Payer: Self-pay | Admitting: Emergency Medicine

## 2018-05-04 DIAGNOSIS — R197 Diarrhea, unspecified: Secondary | ICD-10-CM | POA: Insufficient documentation

## 2018-05-04 DIAGNOSIS — I10 Essential (primary) hypertension: Secondary | ICD-10-CM | POA: Insufficient documentation

## 2018-05-04 DIAGNOSIS — K219 Gastro-esophageal reflux disease without esophagitis: Secondary | ICD-10-CM | POA: Insufficient documentation

## 2018-05-04 DIAGNOSIS — Z87891 Personal history of nicotine dependence: Secondary | ICD-10-CM | POA: Insufficient documentation

## 2018-05-04 LAB — CBC WITH DIFFERENTIAL/PLATELET
Abs Immature Granulocytes: 0.04 10*3/uL (ref 0.00–0.07)
Basophils Absolute: 0 10*3/uL (ref 0.0–0.1)
Basophils Relative: 1 %
EOS PCT: 2 %
Eosinophils Absolute: 0.1 10*3/uL (ref 0.0–0.5)
HEMATOCRIT: 39 % (ref 39.0–52.0)
HEMOGLOBIN: 12.9 g/dL — AB (ref 13.0–17.0)
Immature Granulocytes: 1 %
LYMPHS ABS: 1.4 10*3/uL (ref 0.7–4.0)
LYMPHS PCT: 28 %
MCH: 30.3 pg (ref 26.0–34.0)
MCHC: 33.1 g/dL (ref 30.0–36.0)
MCV: 91.5 fL (ref 80.0–100.0)
MONO ABS: 0.5 10*3/uL (ref 0.1–1.0)
Monocytes Relative: 10 %
NRBC: 0 % (ref 0.0–0.2)
Neutro Abs: 3 10*3/uL (ref 1.7–7.7)
Neutrophils Relative %: 58 %
Platelets: 179 10*3/uL (ref 150–400)
RBC: 4.26 MIL/uL (ref 4.22–5.81)
RDW: 12.7 % (ref 11.5–15.5)
WBC: 5.1 10*3/uL (ref 4.0–10.5)

## 2018-05-04 LAB — COMPREHENSIVE METABOLIC PANEL
ALK PHOS: 92 U/L (ref 38–126)
ALT: 32 U/L (ref 0–44)
ANION GAP: 8 (ref 5–15)
AST: 23 U/L (ref 15–41)
Albumin: 4 g/dL (ref 3.5–5.0)
BILIRUBIN TOTAL: 0.3 mg/dL (ref 0.3–1.2)
BUN: 13 mg/dL (ref 6–20)
CALCIUM: 8.9 mg/dL (ref 8.9–10.3)
CO2: 21 mmol/L — ABNORMAL LOW (ref 22–32)
CREATININE: 0.95 mg/dL (ref 0.61–1.24)
Chloride: 108 mmol/L (ref 98–111)
GFR calc Af Amer: >60 mL/min (ref >60)
GFR calc non Af Amer: >60 mL/min (ref >60)
Glucose, Bld: 91 mg/dL (ref 70–99)
Potassium: 3.2 mmol/L — ABNORMAL LOW (ref 3.5–5.1)
Sodium: 137 mmol/L (ref 135–145)
TOTAL PROTEIN: 6.5 g/dL (ref 6.5–8.1)

## 2018-05-04 LAB — URINALYSIS, ROUTINE W REFLEX MICROSCOPIC
BILIRUBIN URINE: NEGATIVE
Glucose, UA: NEGATIVE mg/dL
HGB URINE DIPSTICK: NEGATIVE
Ketones, ur: NEGATIVE mg/dL
Leukocytes, UA: NEGATIVE
Nitrite: NEGATIVE
PH: 6 (ref 5.0–8.0)
Protein, ur: NEGATIVE mg/dL
SPECIFIC GRAVITY, URINE: 1.025 (ref 1.005–1.030)

## 2018-05-04 LAB — LIPASE, BLOOD: Lipase: 26 U/L (ref 11–51)

## 2018-05-04 MED ORDER — HYOSCYAMINE SULFATE 0.125 MG SL SUBL
0.2500 mg | SUBLINGUAL_TABLET | Freq: Once | SUBLINGUAL | Status: AC
  Administered 2018-05-04: 0.25 mg via SUBLINGUAL
  Filled 2018-05-04: qty 2

## 2018-05-04 MED ORDER — DICYCLOMINE HCL 10 MG/5ML PO SOLN
10.0000 mg | Freq: Once | ORAL | Status: DC
  Filled 2018-05-04: qty 5

## 2018-05-04 MED ORDER — OMEPRAZOLE 20 MG PO CPDR: 20.0000 mg | DELAYED_RELEASE_CAPSULE | Freq: Every day | ORAL | 0 refills | Status: DC

## 2018-05-04 MED ORDER — ALUM & MAG HYDROXIDE-SIMETH 200-200-20 MG/5ML PO SUSP
30.0000 mL | Freq: Once | ORAL | Status: AC
  Administered 2018-05-04: 30 mL via ORAL
  Filled 2018-05-04: qty 30

## 2018-05-04 MED ORDER — DICYCLOMINE HCL 10 MG PO CAPS
10.0000 mg | ORAL_CAPSULE | Freq: Once | ORAL | Status: AC
  Administered 2018-05-04: 10 mg via ORAL
  Filled 2018-05-04: qty 1

## 2018-05-04 NOTE — ED Provider Notes (Signed)
[facility] [place] EMERGENCY DEPARTMENT Provider Note   CSN: [id number] Arrival date & time: 05/04/18  0505     History   Chief Complaint Chief Complaint  Patient presents with  . Diarrhea  . Abdominal Pain    HPI Ronald [name] is a 47 y.o. male.  Patient with history of chronic back pain presenting with 3-day history of diarrhea.  States he has had burning "reflux" in his upper abdomen ongoing for several days as well.  Nausea but no vomiting.  No fever.  States he is had about 5-6 episodes of loose stools daily that is been nonbloody and not black.  Has had nausea but no vomiting.  No fever.  He does have a history of acid reflux but does not take any medication for it.  Symptoms started after he was started on prednisone several days ago for lower back pain.  He has had prednisone in the past without this side effect.  States his back pain is the same as usual.  No focal weakness, numbness or tingling.  No bowel or bladder incontinence.  No fever or vomiting.  No recent antibiotic use, sick contacts or recent travel outside the country.  The history is provided by the patient.  Diarrhea   Associated symptoms include abdominal pain. Pertinent negatives include no vomiting, no headaches, no arthralgias, no myalgias and no cough.  Abdominal Pain   Associated symptoms include diarrhea and nausea. Pertinent negatives include fever, vomiting, dysuria, headaches, arthralgias and myalgias.    Past Medical History:  Diagnosis Date  . Hypertension     Patient Active Problem List   Diagnosis Date Noted  . Low back pain 09/14/2017  . Chest pain 12/15/2015  . Lower leg injury 01/19/2015    Past Surgical History:  Procedure Laterality Date  . arm surgery     torn bicept        Home Medications    Prior to Admission medications   Medication Sig Start Date End Date Taking? Authorizing Provider  hydrochlorothiazide (HYDRODIURIL) 25 MG tablet Take 1 tablet (25 mg total) by  mouth daily. 04/16/18   Volanda Napoleon, PA-C  methocarbamol (ROBAXIN) 500 MG tablet Take 1 tablet (500 mg total) by mouth 2 (two) times daily for 7 days. 05/01/18 05/08/18  Janeece Fitting, PA-C  naproxen (NAPROSYN) 375 MG tablet Take 1 tablet (375 mg total) by mouth 2 (two) times daily. 04/16/18   Volanda Napoleon, PA-C  pantoprazole (PROTONIX) 20 MG tablet Take 1 tablet (20 mg total) by mouth daily. 10/29/17 11/28/17  Mortis, Alvie Heidelberg I, PA-C  predniSONE (DELTASONE) 20 MG tablet Take 2 tablets (40 mg total) by mouth daily for 5 days. 05/01/18 05/06/18  Janeece Fitting, PA-C    Family History Family History  Problem Relation Age of Onset  . Hypertension Mother   . Hypertension Father   . Cancer Other     Social History Social History   Tobacco Use  . Smoking status: Former Smoker    Packs/day: 0.00    Types: Cigars    Last attempt to quit: 05/11/2015    Years since quitting: 2.9  . Smokeless tobacco: Never Used  Substance Use Topics  . Alcohol use: Not Currently    Alcohol/week: 0.0 standard drinks  . Drug use: No     Allergies   Penicillins and Sulfa antibiotics   Review of Systems Review of Systems  Constitutional: Positive for activity change and appetite change. Negative for fever.  HENT: Negative for  congestion.   Eyes: Negative for visual disturbance.  Respiratory: Negative for cough, chest tightness and shortness of breath.   Cardiovascular: Negative for chest pain.  Gastrointestinal: Positive for abdominal pain, diarrhea and nausea. Negative for blood in stool and vomiting.  Genitourinary: Negative for discharge, dysuria and testicular pain.  Musculoskeletal: Positive for back pain. Negative for arthralgias and myalgias.  Skin: Negative for rash.  Neurological: Negative for dizziness, weakness and headaches.   all other systems are negative except as noted in the HPI and PMH.     Physical Exam Updated Vital Signs BP 127/82   Pulse 72   Temp 98 F (36.7 C)    Resp 18   Ht 5' 9.02" (1.753 m)   Wt 87.1 kg   SpO2 100%   BMI 28.34 kg/m   Physical Exam  Constitutional: He is oriented to person, place, and time. He appears well-developed and well-nourished. No distress.  HENT:  Head: Normocephalic and atraumatic.  Mouth/Throat: Oropharynx is clear and moist. No oropharyngeal exudate.  Eyes: Pupils are equal, round, and reactive to light. Conjunctivae and EOM are normal.  Neck: Normal range of motion. Neck supple.  No meningismus.  Cardiovascular: Normal rate, regular rhythm, normal heart sounds and intact distal pulses.  No murmur heard. Pulmonary/Chest: Effort normal and breath sounds normal. No respiratory distress.  Abdominal: Soft. There is tenderness. There is no rebound and no guarding.  Mild epigastric tenderness, no guarding or rebound, no right lower quadrant tenderness  Musculoskeletal: Normal range of motion. He exhibits no edema or tenderness.  Paraspinal lumbar tenderness bilaterally 5/5 strength in bilateral lower extremities. Ankle plantar and dorsiflexion intact. Great toe extension intact bilaterally. +2 DP and PT pulses. +2 patellar reflexes bilaterally. Normal gait.   Neurological: He is alert and oriented to person, place, and time. No cranial nerve deficit. He exhibits normal muscle tone. Coordination normal.  No ataxia on finger to nose bilaterally. No pronator drift. 5/5 strength throughout. CN 2-12 intact.Equal grip strength. Sensation intact.   Skin: Skin is warm.  Psychiatric: He has a normal mood and affect. His behavior is normal.  Nursing note and vitals reviewed.    ED Treatments / Results  Labs (all labs ordered are listed, but only abnormal results are displayed) Labs Reviewed  CBC WITH DIFFERENTIAL/PLATELET - Abnormal; Notable for the following components:      Result Value   Hemoglobin 12.9 (*)    All other components within normal limits  COMPREHENSIVE METABOLIC PANEL - Abnormal; Notable for the  following components:   Potassium 3.2 (*)    CO2 21 (*)    All other components within normal limits  LIPASE, BLOOD  URINALYSIS, ROUTINE W REFLEX MICROSCOPIC    EKG None  Radiology No results found.  Procedures Procedures (including critical care time)  Medications Ordered in ED Medications  alum & mag hydroxide-simeth (MAALOX/MYLANTA) 200-200-20 MG/5ML suspension 30 mL (30 mLs Oral Given 05/04/18 0647)  hyoscyamine (LEVSIN SL) SL tablet 0.25 mg (0.25 mg Sublingual Given 05/04/18 0648)  dicyclomine (BENTYL) capsule 10 mg (10 mg Oral Given 05/04/18 4742)     Initial Impression / Assessment and Plan / ED Course  I have reviewed the triage vital signs and the nursing notes.  Pertinent labs & imaging results that were available during my care of the patient were reviewed by me and considered in my medical decision making (see chart for details).    Epigastric pain and diarrhea in setting of recent prednisone use.  Abdomen  is soft and not peritoneal.  Patient is well-hydrated.  No evidence of cord compression or cauda equina.  Labs reassuring.  Abdomen is benign.  Will start PPI.  Advised patient to discontinue prednisone, also avoid alcohol, NSAIDs, caffeine and spicy foods.  Awaiting toleration of p.o. as well as urinalysis. Care to be transferred at shift change. Final Clinical Impressions(s) / ED Diagnoses   Final diagnoses:  Diarrhea, unspecified type  Gastroesophageal reflux disease, esophagitis presence not specified    ED Discharge Orders    None       Rexton Greulich, Annie Main, MD 05/04/18 [phone]

## 2018-05-04 NOTE — ED Notes (Signed)
Given sprite for fluid challenge

## 2018-05-04 NOTE — Discharge Instructions (Addendum)
Take the stomach medication as prescribed.  Stop taking prednisone.  Avoid alcohol, NSAID medication, caffeine and spicy foods.  You may take Imodium as needed for diarrhea.  Return to the ED with worsening pain, fever, vomiting or any other concerns.

## 2018-05-04 NOTE — ED Provider Notes (Signed)
Care assumed from Dr. Wyvonnia Dusky.  At time of transfer care, patient was awaiting urinalysis and reassessment after p.o. challenge.  Urinalysis was unremarkable and patient was able to tolerate eating and drinking without difficulty.  Patient will be discharged home with PPI and with discontinuation recommendations of his prednisone.  Patient understood plan of care as well as follow-up instructions.  Patient is to return precautions.  Patient other questions or concerns and was discharged in good condition.  Clinical Impression: 1. Diarrhea, unspecified type   2. Gastroesophageal reflux disease, esophagitis presence not specified     Disposition: Discharge  Condition: Good  I have discussed the results, Dx and Tx plan with the pt(& family if present). He/she/they expressed understanding and agree(s) with the plan. Discharge instructions discussed at great length. Strict return precautions discussed and pt &/or family have verbalized understanding of the instructions. No further questions at time of discharge.    New Prescriptions   OMEPRAZOLE (PRILOSEC) 20 MG CAPSULE    Take 1 capsule (20 mg total) by mouth daily.    Follow Up: your doctor in 2 days        Shirell Struthers, Gwenyth Allegra, MD 05/04/18 [phone]

## 2018-05-04 NOTE — ED Notes (Signed)
Pt verbalized understanding of dc instructions.

## 2018-05-04 NOTE — ED Triage Notes (Signed)
Pt states for 3 days he has experienced diarrhea. Tonight experienced an acid sensation in his belly. He also has lower back tightness. Has had nausea, but no vomiting or fever

## 2018-05-13 ENCOUNTER — Ambulatory Visit (INDEPENDENT_AMBULATORY_CARE_PROVIDER_SITE_OTHER): Payer: Self-pay | Admitting: Family Medicine

## 2018-05-13 ENCOUNTER — Encounter: Payer: Self-pay | Admitting: Family Medicine

## 2018-05-13 VITALS — BP 133/91 | HR 64 | Ht 69.0 in | Wt 185.0 lb

## 2018-05-13 DIAGNOSIS — M5440 Lumbago with sciatica, unspecified side: Secondary | ICD-10-CM

## 2018-05-13 MED ORDER — DICLOFENAC SODIUM 75 MG PO TBEC: 75.0000 mg | DELAYED_RELEASE_TABLET | Freq: Two times a day (BID) | ORAL | 1 refills | Status: DC

## 2018-05-13 MED ORDER — HYDROCODONE-ACETAMINOPHEN 5-325 MG PO TABS: 1.0000 | ORAL_TABLET | Freq: Four times a day (QID) | ORAL | 0 refills | Status: DC | PRN

## 2018-05-13 MED ORDER — TIZANIDINE HCL 4 MG PO TABS: 4.0000 mg | ORAL_TABLET | Freq: Three times a day (TID) | ORAL | 1 refills | Status: DC | PRN

## 2018-05-13 NOTE — Patient Instructions (Signed)
You have lumbar radiculopathy (a pinched nerve in your low back), possible spinal stenosis given you have this into both legs. We will go ahead with an MRI of your lumbar spine. Start diclofenac twice a day with food for pain and inflammation. Norco as needed for severe pain (no driving on this medicine). Tizanidine as needed for muscle spasms (no driving on this medicine if it makes you sleepy). Stay as active as possible. Follow up and next steps will depend on the MRI results.

## 2018-05-13 NOTE — Progress Notes (Addendum)
PCP: Patient, No Pcp Per  Subjective:   HPI: Patient is a 47 y.o. male here for low back pain.  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 He continued to try to work but with pain and reports the next day he woke up and was extremely stiff. He initially tried Robaxin and naproxen from an urgent care without much benefit. He tried salon pus patches on his own. Prednisone seemed to help it bothered his stomach. He has been using heat and ice. He has been out of work since this time.  He reports pain that initially was just in the right leg but now includes the left leg. He has been having tingling in all toes of both feet but also tingling into his fingertips in both hands. Pain radiates around from the back into anterior pelvis. No bowel or bladder dysfunction. No skin changes. Pain level is 8 out of 10 and sharp.  Past Medical History:  Diagnosis Date  . Hypertension     Current Outpatient Medications on File Prior to Visit  Medication Sig Dispense Refill  . hydrochlorothiazide (HYDRODIURIL) 25 MG tablet Take 1 tablet (25 mg total) by mouth daily. 30 tablet 0  . naproxen (NAPROSYN) 375 MG tablet Take 1 tablet (375 mg total) by mouth 2 (two) times daily. 20 tablet 0  . omeprazole (PRILOSEC) 20 MG capsule Take 1 capsule (20 mg total) by mouth daily. 30 capsule 0  . pantoprazole (PROTONIX) 20 MG tablet Take 1 tablet (20 mg total) by mouth daily. 30 tablet 0   No current facility-administered medications on file prior to visit.     Past Surgical History:  Procedure Laterality Date  . arm surgery     torn bicept    Allergies  Allergen Reactions  . Penicillins Anaphylaxis    Has patient had a PCN reaction causing immediate rash, facial/tongue/throat swelling, SOB or lightheadedness with hypotension: yes Has patient had a PCN reaction causing  severe rash involving mucus membranes or skin necrosis: unknown Has patient had a PCN reaction that required hospitalization yes Has patient had a PCN reaction occurring within the last 10 years: no If all of the above answers are "NO", then may proceed with Cephalosporin use. \  . Sulfa Antibiotics Anaphylaxis    Social History   Socioeconomic History  . Marital status: Divorced    Spouse name: Not on file  . Number of children: 7  . Years of education: Not on file  . Highest education level: Not on file  Occupational History  . Not on file  Social Needs  . Financial resource strain: Not on file  . Food insecurity:    Worry: Not on file    Inability: Not on file  . Transportation needs:    Medical: Not on file    Non-medical: Not on file  Tobacco Use  . Smoking status: Former Smoker    Packs/day: 0.00    Types: Cigars    Last attempt to quit: 05/11/2015    Years since quitting: 3.0  . Smokeless tobacco: Never Used  Substance and Sexual Activity  . Alcohol use: Not Currently    Alcohol/week: 0.0 standard drinks  . Drug use: No  . Sexual activity: Not on file  Lifestyle  . Physical activity:    Days per week: Not on file  Minutes per session: Not on file  . Stress: Not on file  Relationships  . Social connections:    Talks on phone: Not on file    Gets together: Not on file    Attends religious service: Not on file    Active member of club or organization: Not on file    Attends meetings of clubs or organizations: Not on file    Relationship status: Not on file  . Intimate partner violence:    Fear of current or ex partner: Not on file    Emotionally abused: Not on file    Physically abused: Not on file    Forced sexual activity: Not on file  Other Topics Concern  . Not on file  Social History Narrative  . Not on file    Family History  Problem Relation Age of Onset  . Hypertension Mother   . Hypertension Father   . Cancer Other     BP (!) 133/91    Pulse 64   Ht 5\' 9"  (1.753 m)   Wt 185 lb (83.9 kg)   BMI 27.32 kg/m   Review of Systems: See HPI above.     Objective:  Physical Exam:  Gen: NAD, comfortable in exam room  Back: No gross deformity, scoliosis. TTP low lumbar spine, paraspinal regions.  No focal bony TTP. ROM very limited at 10 degrees flexion, 5 extension limited by pain. Strength LEs 5/5 all muscle groups except 3/5 with right ankle plantarflexion.   Trace MSRs in patellar and achilles tendons, equal bilaterally. Negative SLRs. Sensation intact to light touch bilaterally.  Bilateral hips: No deformity. FROM with 5/5 strength. No tenderness to palpation. NVI distally. Negative logroll bilateral hips.   Assessment & Plan:  1. Low back pain with radiation into both legs - concerning for lumbar radiculopathy, possible spinal stenosis from central disc herniation sustained at work.  Not improving with conservative treatment including robaxin, meloxicam, prednisone dose pack and has weakness on right ankle, numbness in both feet.  Will go ahead with MRI to further assess.  Start diclofenac, norco with tizanidine in meantime.  F/u will depend on MRI results.  Addendum:  MRI reviewed and discussed with patient.  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  Discussed options - will go ahead with ESIs on right at L4-5 and left at L5-S1.  Advised for him to follow up with me a week after the injections for reevaluation.  Work note printed.

## 2018-05-13 NOTE — Addendum Note (Signed)
Addended by: Sherrie George F on: 05/13/2018 03:00 PM   Modules accepted: Orders

## 2018-05-15 ENCOUNTER — Ambulatory Visit: Payer: Self-pay | Admitting: Family Medicine

## 2018-05-15 ENCOUNTER — Encounter

## 2018-05-18 ENCOUNTER — Emergency Department (HOSPITAL_BASED_OUTPATIENT_CLINIC_OR_DEPARTMENT_OTHER)
Admission: EM | Admit: 2018-05-18 | Discharge: 2018-05-18 | Disposition: A | Discharge status: Home / Self Care | Payer: Self-pay | Attending: Emergency Medicine | Admitting: Emergency Medicine

## 2018-05-18 ENCOUNTER — Encounter (HOSPITAL_BASED_OUTPATIENT_CLINIC_OR_DEPARTMENT_OTHER): Payer: Self-pay | Admitting: Emergency Medicine

## 2018-05-18 ENCOUNTER — Other Ambulatory Visit: Payer: Self-pay

## 2018-05-18 DIAGNOSIS — R05 Cough: Secondary | ICD-10-CM | POA: Insufficient documentation

## 2018-05-18 DIAGNOSIS — Z5321 Procedure and treatment not carried out due to patient leaving prior to being seen by health care provider: Secondary | ICD-10-CM | POA: Insufficient documentation

## 2018-05-18 NOTE — ED Triage Notes (Addendum)
Pt here with son for dry cough x 1 week.

## 2018-05-27 ENCOUNTER — Ambulatory Visit
Admission: RE | Admit: 2018-05-27 | Discharge: 2018-05-27 | Disposition: A | Discharge status: Home / Self Care | Payer: Worker's Compensation | Source: Ambulatory Visit | Attending: Family Medicine | Admitting: Family Medicine

## 2018-05-27 ENCOUNTER — Other Ambulatory Visit: Payer: Self-pay

## 2018-05-27 DIAGNOSIS — M5440 Lumbago with sciatica, unspecified side: Secondary | ICD-10-CM

## 2018-05-30 ENCOUNTER — Other Ambulatory Visit: Payer: Self-pay | Admitting: Family Medicine

## 2018-05-30 DIAGNOSIS — M545 Low back pain, unspecified: Secondary | ICD-10-CM

## 2018-05-30 DIAGNOSIS — G8929 Other chronic pain: Secondary | ICD-10-CM

## 2018-06-10 ENCOUNTER — Emergency Department (HOSPITAL_BASED_OUTPATIENT_CLINIC_OR_DEPARTMENT_OTHER)
Admission: EM | Admit: 2018-06-10 | Discharge: 2018-06-10 | Disposition: A | Discharge status: Home / Self Care | Payer: 59 | Attending: Emergency Medicine | Admitting: Emergency Medicine

## 2018-06-10 ENCOUNTER — Other Ambulatory Visit: Payer: Self-pay

## 2018-06-10 ENCOUNTER — Encounter (HOSPITAL_BASED_OUTPATIENT_CLINIC_OR_DEPARTMENT_OTHER): Payer: Self-pay | Admitting: *Deleted

## 2018-06-10 ENCOUNTER — Inpatient Hospital Stay: Admission: RE | Admit: 2018-06-10 | Payer: Self-pay | Source: Ambulatory Visit

## 2018-06-10 DIAGNOSIS — M5440 Lumbago with sciatica, unspecified side: Secondary | ICD-10-CM | POA: Insufficient documentation

## 2018-06-10 DIAGNOSIS — Z79899 Other long term (current) drug therapy: Secondary | ICD-10-CM | POA: Diagnosis not present

## 2018-06-10 DIAGNOSIS — M545 Low back pain: Secondary | ICD-10-CM | POA: Diagnosis present

## 2018-06-10 DIAGNOSIS — Z87891 Personal history of nicotine dependence: Secondary | ICD-10-CM | POA: Diagnosis not present

## 2018-06-10 DIAGNOSIS — I10 Essential (primary) hypertension: Secondary | ICD-10-CM | POA: Insufficient documentation

## 2018-06-10 MED ORDER — HYDROCHLOROTHIAZIDE 25 MG PO TABS: 25.0000 mg | ORAL_TABLET | Freq: Every day | ORAL | 0 refills | Status: DC

## 2018-06-10 MED ORDER — CYCLOBENZAPRINE HCL 10 MG PO TABS: 10.0000 mg | ORAL_TABLET | Freq: Two times a day (BID) | ORAL | 0 refills | Status: DC | PRN

## 2018-06-10 NOTE — Discharge Instructions (Addendum)
Your exam today was consistent with unchanged low back pain from her prior injury.  We did not see any red flags requiring [place] to emergently transfer you for further imaging or further work-up.  As you reported the muscle relaxant helped previously, please use the medication to help with your discomfort and follow-up with your sports medicine team to discuss further management.  The medical record service and the [facility] system will be able to provide the records you were requesting.  Please call them for this.  Please use the blood pressure medication as previous.  If any symptoms change or worsen, please return to the nearest emergency department.   The medical records department is [phone] and then choose option #2 when going through the phone tree.  They should be able to help you with your request.

## 2018-06-10 NOTE — ED Triage Notes (Signed)
Pt reports lbp since 11/19 when he had an accident at work. Pt states he has had mri and it shows 2 disc that are on nerves, but he is having to fight for his worker's comp for treatment.

## 2018-06-10 NOTE — ED Provider Notes (Signed)
[facility] EMERGENCY DEPARTMENT Provider Note   CSN: [id number] Arrival date & time: 06/10/18  2353     History   Chief Complaint Chief Complaint  Patient presents with  . Back Pain    HPI Ronald [name] is a 48 y.o. male.  The history is provided by the patient and medical records. No language interpreter was used.  Back Pain  Location:  Lumbar spine Quality:  Shooting and aching Radiates to:  L posterior upper leg and R posterior upper leg Pain is:  Same all the time Onset quality:  Gradual Duration:  2 months Timing:  Constant Progression:  Waxing and waning Chronicity:  Chronic Context: recent injury   Relieved by:  Nothing Worsened by:  Nothing Ineffective treatments:  None tried Associated symptoms: leg pain and tingling   Associated symptoms: no abdominal pain, no abdominal swelling, no bladder incontinence, no bowel incontinence, no chest pain, no dysuria, no fever, no headaches, no numbness, no paresthesias, no pelvic pain and no weakness     Past Medical History:  Diagnosis Date  . Hypertension     Patient Active Problem List   Diagnosis Date Noted  . Low back pain 09/14/2017  . Chest pain 12/15/2015  . Lower leg injury 01/19/2015    Past Surgical History:  Procedure Laterality Date  . arm surgery     torn bicept        Home Medications    Prior to Admission medications   Medication Sig Start Date End Date Taking? Authorizing Provider  diclofenac (VOLTAREN) 75 MG EC tablet Take 1 tablet (75 mg total) by mouth 2 (two) times daily. 05/13/18   Hudnall, Sharyn Lull, MD  hydrochlorothiazide (HYDRODIURIL) 25 MG tablet Take 1 tablet (25 mg total) by mouth daily. 04/16/18   Volanda Napoleon, PA-C  HYDROcodone-acetaminophen (NORCO) 5-325 MG tablet Take 1 tablet by mouth every 6 (six) hours as needed for moderate pain. 05/13/18   Hudnall, Sharyn Lull, MD  naproxen (NAPROSYN) 375 MG tablet Take 1 tablet (375 mg total) by mouth 2 (two) times daily.  04/16/18   Volanda Napoleon, PA-C  omeprazole (PRILOSEC) 20 MG capsule Take 1 capsule (20 mg total) by mouth daily. 05/04/18   Rancour, Annie Main, MD  pantoprazole (PROTONIX) 20 MG tablet Take 1 tablet (20 mg total) by mouth daily. 10/29/17 11/28/17  Mortis, Alvie Heidelberg I, PA-C  tiZANidine (ZANAFLEX) 4 MG tablet Take 1 tablet (4 mg total) by mouth every 8 (eight) hours as needed. 05/13/18   Dene Gentry, MD    Family History Family History  Problem Relation Age of Onset  . Hypertension Mother   . Hypertension Father   . Cancer Other     Social History Social History   Tobacco Use  . Smoking status: Former Smoker    Packs/day: 0.00    Types: Cigars    Last attempt to quit: 05/11/2015    Years since quitting: 3.0  . Smokeless tobacco: Never Used  Substance Use Topics  . Alcohol use: Not Currently    Alcohol/week: 0.0 standard drinks  . Drug use: No     Allergies   Penicillins and Sulfa antibiotics   Review of Systems Review of Systems  Constitutional: Negative for chills, diaphoresis, fatigue and fever.  HENT: Negative for congestion.   Respiratory: Negative for cough, chest tightness, shortness of breath and wheezing.   Cardiovascular: Negative for chest pain and palpitations.  Gastrointestinal: Negative for abdominal pain, bowel incontinence, constipation, diarrhea, nausea and  vomiting.  Genitourinary: Negative for bladder incontinence, decreased urine volume, dysuria, flank pain, frequency, pelvic pain and urgency.  Musculoskeletal: Positive for back pain. Negative for neck pain and neck stiffness.  Skin: Negative for rash and wound.  Neurological: Positive for tingling. Negative for weakness, light-headedness, numbness, headaches and paresthesias.       Unchanged tingling in both legs at times   Psychiatric/Behavioral: Negative for agitation.  All other systems reviewed and are negative.    Physical Exam Updated Vital Signs BP (!) 151/100 (BP Location: Left Arm)    Pulse 79   Temp 97.6 F (36.4 C) (Oral)   Resp 18   Ht 5\' 9"  (1.753 m)   Wt 83.9 kg   SpO2 100%   BMI 27.32 kg/m   Physical Exam Vitals signs and nursing note reviewed.  Constitutional:      General: He is not in acute distress.    Appearance: He is well-developed. He is not ill-appearing, toxic-appearing or diaphoretic.  HENT:     Head: Normocephalic and atraumatic.  Eyes:     Conjunctiva/sclera: Conjunctivae normal.  Neck:     Musculoskeletal: Neck supple.  Cardiovascular:     Rate and Rhythm: Normal rate and regular rhythm.     Heart sounds: No murmur.  Pulmonary:     Effort: Pulmonary effort is normal. No respiratory distress.     Breath sounds: Normal breath sounds. No wheezing, rhonchi or rales.  Chest:     Chest wall: No tenderness.  Abdominal:     General: Abdomen is flat.     Palpations: Abdomen is soft.     Tenderness: There is no abdominal tenderness.  Musculoskeletal:        General: Tenderness present.     Lumbar back: He exhibits tenderness and pain.       Back:  Skin:    General: Skin is warm and dry.     Capillary Refill: Capillary refill takes less than 2 seconds.     Findings: No erythema or rash.  Neurological:     General: No focal deficit present.     Mental Status: He is alert and oriented to person, place, and time.     GCS: GCS eye subscore is 4. GCS verbal subscore is 5. GCS motor subscore is 6.     Sensory: No sensory deficit.     Motor: No weakness.     Comments: Patient had no numbness or weakness on my initial exam of the legs.  Symmetric DP pulse palpated.    Psychiatric:        Mood and Affect: Mood normal.      ED Treatments / Results  Labs (all labs ordered are listed, but only abnormal results are displayed) Labs Reviewed - No data to display  EKG None  Radiology No results found.  Procedures Procedures (including critical care time)  Medications Ordered in ED Medications - No data to display   Initial  Impression / Assessment and Plan / ED Course  I have reviewed the triage vital signs and the nursing notes.  Pertinent labs & imaging results that were available during my care of the patient were reviewed by me and considered in my medical decision making (see chart for details).     Mister Venuto is a 48 y.o. male with a past medical history significant for hypertension currently out of his anti-hypertensive medication, and recent back injury who presents with continued low back pain.  Patient reports that he had  a back injury while lifting heavy things at work several months ago and has had continued low back pain.  He reports that he had MRI ordered by sports medicine whom he followed up with showing evidence of lumbar disc injury leading to sciatic type nerve pain.  He reports that the pains have been ongoing and he was scheduled to have epidural injections performed which was reportedly halted by his insurance.  Patient reports that due to the pain continuing he was going to the emergency department seeking further management until he can get his treatment plan sorted out.  He denies any new injuries.  He denies any new bending or straining.  He denies any new urinary or stool incontinence, and reports his leg tingling is unchanged from baseline.  He denies weakness.  He denies any nausea vomiting, fevers, chills, congestion, or cough.  No other complaints.  He describes his pain gets up to 9 out of 10 in severity.  On exam, patient has diffuse tenderness in his low back both paraspinal and midline.  No weakness was discovered on exam.  Normal sensation.  Patient's had no CVA tenderness and lungs are clear.  Abdomen and chest nontender.  Patient resting comfortably otherwise.  Patient reports muscle relaxant helped him initially but he reports the prednisone caused his stomach to take.  Patient was given a short course of muscle relaxants given his prior success with that and he will follow-up with  his sports medicine team to continue outpatient management and planning.  Patient also was inquiring about getting records from his prior visit and he was directed to call the medical record service at [facility] system for further assistance.  Patient requested refill of his blood pressure medicine which he ran out of several days ago, this will be ordered.  Patient understood return precautions and follow-up instructions.  Patient discharged in good condition.   Final Clinical Impressions(s) / ED Diagnoses   Final diagnoses:  Acute bilateral low back pain with sciatica, sciatica laterality unspecified    ED Discharge Orders         Ordered    cyclobenzaprine (FLEXERIL) 10 MG tablet  2 times daily PRN     06/10/18 0942    hydrochlorothiazide (HYDRODIURIL) 25 MG tablet  Daily     06/10/18 0942          Clinical Impression: 1. Acute bilateral low back pain with sciatica, sciatica laterality unspecified     Disposition: Discharge  Condition: Good  I have discussed the results, Dx and Tx plan with the pt(& family if present). He/she/they expressed understanding and agree(s) with the plan. Discharge instructions discussed at great length. Strict return precautions discussed and pt &/or family have verbalized understanding of the instructions. No further questions at time of discharge.    New Prescriptions   CYCLOBENZAPRINE (FLEXERIL) 10 MG TABLET    Take 1 tablet (10 mg total) by mouth 2 (two) times daily as needed for muscle spasms.   HYDROCHLOROTHIAZIDE (HYDRODIURIL) 25 MG TABLET    Take 1 tablet (25 mg total) by mouth daily.    Follow Up: Dene Gentry, MD [address] [place] [address] [phone]     [facility] EMERGENCY DEPARTMENT [address] [id number] JJ [facility] [place] [phone]       Sheilia Reznick, Gwenyth Allegra, MD 06/10/18 [phone]

## 2018-06-30 ENCOUNTER — Telehealth: Payer: Self-pay | Admitting: Family Medicine

## 2018-06-30 NOTE — Telephone Encounter (Signed)
Spoke with patient. He just got insurance coverage and is going to contact [facility] to have ESI set up.  Order has already been sent to [facility].

## 2018-06-30 NOTE — Telephone Encounter (Signed)
Left voicemail for him to return my call

## 2018-06-30 NOTE — Telephone Encounter (Signed)
I thought he was going ahead with injections of his back after we talked over a month ago - then he was to follow up with me a week after these to reevaluate him.

## 2018-06-30 NOTE — Telephone Encounter (Signed)
Patient called requesting to be referred to physical therapy for his back

## 2018-07-01 NOTE — Telephone Encounter (Signed)
Ok sounds good - then would like to see him in office a week after that once he knows the date of his injection.

## 2018-07-02 ENCOUNTER — Other Ambulatory Visit: Payer: Self-pay | Admitting: Family Medicine

## 2018-07-02 DIAGNOSIS — G8929 Other chronic pain: Secondary | ICD-10-CM

## 2018-07-02 DIAGNOSIS — M545 Low back pain, unspecified: Secondary | ICD-10-CM

## 2018-07-07 ENCOUNTER — Inpatient Hospital Stay
Admission: RE | Admit: 2018-07-07 | Discharge: 2018-07-07 | Disposition: A | Discharge status: Home / Self Care | Payer: Self-pay | Source: Ambulatory Visit | Attending: Family Medicine | Admitting: Family Medicine

## 2018-07-07 NOTE — Discharge Instructions (Signed)

## 2018-07-22 ENCOUNTER — Ambulatory Visit
Admission: RE | Admit: 2018-07-22 | Discharge: 2018-07-22 | Disposition: A | Discharge status: Home / Self Care | Payer: 59 | Source: Ambulatory Visit | Attending: Family Medicine | Admitting: Family Medicine

## 2018-07-22 DIAGNOSIS — M545 Low back pain, unspecified: Secondary | ICD-10-CM

## 2018-07-22 DIAGNOSIS — G8929 Other chronic pain: Secondary | ICD-10-CM

## 2018-07-22 MED ORDER — METHYLPREDNISOLONE ACETATE 40 MG/ML INJ SUSP (RADIOLOG
120.0000 mg | Freq: Once | INTRAMUSCULAR | Status: AC
  Administered 2018-07-22: 120 mg via EPIDURAL

## 2018-07-22 MED ORDER — IOPAMIDOL (ISOVUE-M 200) INJECTION 41%
1.0000 mL | Freq: Once | INTRAMUSCULAR | Status: AC
  Administered 2018-07-22: 1 mL via EPIDURAL

## 2018-07-22 NOTE — Discharge Instructions (Signed)
Post Procedure Spinal Discharge Instruction Sheet  1. You may resume a regular diet and any medications that you routinely take (including pain medications).  2. No driving day of procedure.  3. Light activity throughout the rest of the day.  Do not do any strenuous work, exercise, bending or lifting.  The day following the procedure, you can resume normal physical activity but you should refrain from exercising or physical therapy for at least three days thereafter.   Common Side Effects:   Headaches- take your usual medications as directed by your physician.  Increase your fluid intake.  Caffeinated beverages may be helpful.  Lie flat in bed until your headache resolves.   Restlessness or inability to sleep- you may have trouble sleeping for the next few days.  Ask your referring physician if you need any medication for sleep.   Facial flushing or redness- should subside within a few days.   Increased pain- a temporary increase in pain a day or two following your procedure is not unusual.  Take your pain medication as prescribed by your referring physician.   Leg cramps  Please contact our office at [phone] for the following symptoms:  Fever greater than 100 degrees.  Headaches unresolved with medication after 2-3 days.  Increased swelling, pain, or redness at injection site.  YOU MAY RESTART THE BC POWDERS AND EXCEDRIN TODAY AS NEEDED.

## 2018-07-29 NOTE — Telephone Encounter (Signed)
Patient has been scheduled for follow up after ESI

## 2018-08-06 ENCOUNTER — Encounter: Payer: Self-pay | Admitting: Family Medicine

## 2018-08-06 ENCOUNTER — Other Ambulatory Visit: Payer: Self-pay

## 2018-08-06 ENCOUNTER — Ambulatory Visit (INDEPENDENT_AMBULATORY_CARE_PROVIDER_SITE_OTHER): Payer: 59 | Admitting: Family Medicine

## 2018-08-06 VITALS — BP 159/110 | HR 73 | Ht 69.0 in | Wt 200.0 lb

## 2018-08-06 DIAGNOSIS — M5440 Lumbago with sciatica, unspecified side: Secondary | ICD-10-CM | POA: Diagnosis not present

## 2018-08-06 NOTE — Patient Instructions (Signed)
I would recommend physical therapy but check with your insurance on cost first - you need to know your deductible and how much visits would cost then call me with what you find out. If you do, an outpatient facility like at [facility] would be ideal due to cost compared to here. Continue home exercises and stretches in meantime. I don't think you need repeat injections at this time.

## 2018-08-07 ENCOUNTER — Encounter: Payer: Self-pay | Admitting: Family Medicine

## 2018-08-07 NOTE — Progress Notes (Signed)
PCP: System, Pcp Not In  Subjective:   HPI: Patient is a 48 y.o. male here for low back pain.  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 He continued to try to work but with pain and reports the next day he woke up and was extremely stiff. He initially tried Robaxin and naproxen from an urgent care without much benefit. He tried salon pus patches on his own. Prednisone seemed to help it bothered his stomach. He has been using heat and ice. He has been out of work since this time.  He reports pain that initially was just in the right leg but now includes the left leg. He has been having tingling in all toes of both feet but also tingling into his fingertips in both hands. Pain radiates around from the back into anterior pelvis. No bowel or bladder dysfunction. No skin changes. Pain level is 8 out of 10 and sharp.  08/06/18: Patient reports he feels improved compared to last visit. He reports he feels spasms now and again has tightness of his low back including currently. Pain level 0 out of 10. He does do the home exercises regularly and takes ibuprofen if needed. No bowel or bladder dysfunction. Radiation into his legs is improved. He had an ESI on the right at L4-5 on February 25 and feels like this is helped quite a bit.  Past Medical History:  Diagnosis Date  . Hypertension     Current Outpatient Medications on File Prior to Visit  Medication Sig Dispense Refill  . cyclobenzaprine (FLEXERIL) 10 MG tablet Take 1 tablet (10 mg total) by mouth 2 (two) times daily as needed for muscle spasms. 20 tablet 0  . doxycycline (VIBRAMYCIN) 100 MG capsule TAKE 1 CAPSULE BY MOUTH TWICE DAILY WITH MEALS    . hydrochlorothiazide (HYDRODIURIL) 25 MG tablet Take 1 tablet (25 mg total) by mouth daily. 30 tablet 0  . hydrochlorothiazide (HYDRODIURIL) 25  MG tablet Take 1 tablet (25 mg total) by mouth daily. 30 tablet 0  . omeprazole (PRILOSEC) 20 MG capsule Take 1 capsule (20 mg total) by mouth daily. 30 capsule 0  . pantoprazole (PROTONIX) 20 MG tablet Take 1 tablet (20 mg total) by mouth daily. 30 tablet 0  . VENTOLIN HFA 108 (90 Base) MCG/ACT inhaler INHALE 2 PUFFS BY MOUTH EVERY 4 TO 6 HOURS AS NEEDED     No current facility-administered medications on file prior to visit.     Past Surgical History:  Procedure Laterality Date  . arm surgery     torn bicept    Allergies  Allergen Reactions  . Penicillins Anaphylaxis    Has patient had a PCN reaction causing immediate rash, facial/tongue/throat swelling, SOB or lightheadedness with hypotension: yes Has patient had a PCN reaction causing severe rash involving mucus membranes or skin necrosis: unknown Has patient had a PCN reaction that required hospitalization yes Has patient had a PCN reaction occurring within the last 10 years: no If all of the above answers are "NO", then may proceed with Cephalosporin use. \  . Sulfa Antibiotics Anaphylaxis    Social History   Socioeconomic History  . Marital status: Divorced    Spouse name: Not on file  . Number of children: 7  . Years of education: Not on file  . Highest education level: Not  on file  Occupational History  . Not on file  Social Needs  . Financial resource strain: Not on file  . Food insecurity:    Worry: Not on file    Inability: Not on file  . Transportation needs:    Medical: Not on file    Non-medical: Not on file  Tobacco Use  . Smoking status: Former Smoker    Packs/day: 0.00    Types: Cigars    Last attempt to quit: 05/11/2015    Years since quitting: 3.2  . Smokeless tobacco: Never Used  Substance and Sexual Activity  . Alcohol use: Not Currently    Alcohol/week: 0.0 standard drinks  . Drug use: No  . Sexual activity: Not on file  Lifestyle  . Physical activity:    Days per week: Not on file     Minutes per session: Not on file  . Stress: Not on file  Relationships  . Social connections:    Talks on phone: Not on file    Gets together: Not on file    Attends religious service: Not on file    Active member of club or organization: Not on file    Attends meetings of clubs or organizations: Not on file    Relationship status: Not on file  . Intimate partner violence:    Fear of current or ex partner: Not on file    Emotionally abused: Not on file    Physically abused: Not on file    Forced sexual activity: Not on file  Other Topics Concern  . Not on file  Social History Narrative  . Not on file    Family History  Problem Relation Age of Onset  . Hypertension Mother   . Hypertension Father   . Cancer Other     BP (!) 159/110   Pulse 73   Ht 5\' 9"  (1.753 m)   Wt 200 lb (90.7 kg)   BMI 29.53 kg/m   Review of Systems: See HPI above.     Objective:  Physical Exam:  Gen: NAD, comfortable in exam room  Back: No gross deformity, scoliosis. No TTP .  No midline or bony TTP. FROM though slow, soreness with flexion > extension. Strength LEs 5/5 all muscle groups.   Negative SLRs. Sensation intact to light touch bilaterally. Negative logroll bilateral hips   Assessment & Plan:  1. Low back pain with radiation into both legs -improved compared to last visit including ESI at the L4-5 level on the right.  He is going to check with his insurance regarding physical therapy as did believe this would be the next step and helpful for him.  He can take ibuprofen with Flexeril as needed.  Continue his home exercises and stretches in the meantime.  Follow-up in about 6 weeks if he does physical therapy.

## 2018-08-21 ENCOUNTER — Telehealth: Payer: Self-pay | Admitting: Family Medicine

## 2018-08-21 NOTE — Telephone Encounter (Signed)
Ok to put in referral to physical therapy provided we can find a place that is still open during the pandemic and he really wants to do this... It would be in his best interest to wait if he can and focus on home exercises - check with him please.

## 2018-08-21 NOTE — Telephone Encounter (Signed)
-----   Message from Carolyne Littles sent at 08/21/2018  4:10 PM EDT ----- Regarding: phone message Contact: [phone] Pt called to let you know that his insurance does cover PT. He is asking for a referral to PT.

## 2018-08-27 NOTE — Telephone Encounter (Signed)
Order for PT sent next door.

## 2018-08-27 NOTE — Addendum Note (Signed)
Addended by: Sherrie George F on: 08/27/2018 08:20 AM   Modules accepted: Orders

## 2018-09-03 ENCOUNTER — Ambulatory Visit: Payer: 59 | Admitting: Physical Therapy

## 2018-09-10 ENCOUNTER — Encounter: Payer: Self-pay | Admitting: Physical Therapy

## 2018-09-10 ENCOUNTER — Other Ambulatory Visit: Payer: Self-pay

## 2018-09-10 ENCOUNTER — Ambulatory Visit: Payer: 59 | Attending: Family Medicine | Admitting: Physical Therapy

## 2018-09-10 DIAGNOSIS — M5416 Radiculopathy, lumbar region: Secondary | ICD-10-CM | POA: Diagnosis present

## 2018-09-10 DIAGNOSIS — M5442 Lumbago with sciatica, left side: Secondary | ICD-10-CM

## 2018-09-10 DIAGNOSIS — R293 Abnormal posture: Secondary | ICD-10-CM | POA: Diagnosis present

## 2018-09-10 DIAGNOSIS — M5441 Lumbago with sciatica, right side: Secondary | ICD-10-CM | POA: Diagnosis present

## 2018-09-10 DIAGNOSIS — M6281 Muscle weakness (generalized): Secondary | ICD-10-CM | POA: Diagnosis present

## 2018-09-10 DIAGNOSIS — M62830 Muscle spasm of back: Secondary | ICD-10-CM

## 2018-09-10 DIAGNOSIS — R29898 Other symptoms and signs involving the musculoskeletal system: Secondary | ICD-10-CM | POA: Diagnosis present

## 2018-09-10 NOTE — Patient Instructions (Addendum)
TENS UNIT  This is helpful for muscle pain and spasm.   Search and Purchase a TENS 7000 2nd edition at www.tenspros.com or www.amazon.com  (It should be less than $30)     TENS unit instructions:   Do not shower or bathe with the unit on  Turn the unit off before removing electrodes or batteries  If the electrodes lose stickiness add a drop of water to the electrodes after they are disconnected from the unit and place on plastic sheet. If you continued to have difficulty, call the TENS unit company to purchase more electrodes.  Do not apply lotion on the skin area prior to use. Make sure the skin is clean and dry as this will help prolong the life of the electrodes.  After use, always check skin for unusual red areas, rash or other skin difficulties. If there are any skin problems, does not apply electrodes to the same area.  Never remove the electrodes from the unit by pulling the wires.  Do not use the TENS unit or electrodes other than as directed.  Do not change electrode placement without consulting your therapist or physician.  Keep 2 fingers with between each electrode.   TENS stands for Transcutaneous Electrical Nerve Stimulation. In other words, electrical impulses are allowed to pass through the skin in order to excite a nerve.   Purpose and Use of TENS:  TENS is a method used to manage acute and chronic pain without the use of drugs. It has been effective in managing pain associated with surgery, sprains, strains, trauma, rheumatoid arthritis, and neuralgias. It is a non-addictive, low risk, and non-invasive technique used to control pain. It is not, by any means, a curative form of treatment.   How TENS Works:  Most TENS units are a Paramedic unit powered by one 9 volt battery. Attached to the outside of the unit are two lead wires where two pins and/or snaps connect on each wire. All units come with a set of four reusable pads or electrodes. These are placed  on the skin surrounding the area involved. By inserting the leads into  the pads, the electricity can pass from the unit making the circuit complete.  As the intensity is turned up slowly, the electrical current enters the body from the electrodes through the skin to the surrounding nerve fibers. This triggers the release of hormones from within the body. These hormones contain pain relievers. By increasing the circulation of these hormones, the person's pain may be lessened. It is also believed that the electrical stimulation itself helps to block the pain messages being sent to the brain, thus also decreasing the body's perception of pain.   Hazards:  TENS units are NOT to be used by patients with PACEMAKERS, DEFIBRILLATORS, DIABETIC PUMPS, PREGNANT WOMEN, and patients with SEIZURE DISORDERS.  TENS units are NOT to be used over the heart, throat, brain, or spinal cord.  One of the major side effects from the TENS unit may be skin irritation. Some people may develop a rash if they are sensitive to the materials used in the electrodes or the connecting wires.   Wear the unit for up 30-40 minutes at a time, 3-4x/day.   Avoid overuse due the body getting used to the stem making it not as effective over time.

## 2018-09-10 NOTE — Therapy (Addendum)
[facility] [address]  [place], [place], [address] Phone: [phone]   Fax:  [phone]  Physical Therapy Evaluation / Discharge Summary  Patient Details  Name: Ronald [name] MRN: [id number] Date of Birth: 1970-06-05 Referring Provider (PT): Karlton Lemon, MD   Encounter Date: 09/10/2018  PT End of Session - 09/10/18 1111    Visit Number  1    Number of Visits  12    PT Start Time  1111    PT Stop Time  1237    PT Time Calculation (min)  86 min    Activity Tolerance  Patient tolerated treatment well;Patient limited by pain    Behavior During Therapy  [facility] for tasks assessed/performed       Past Medical History:  Diagnosis Date  . Hypertension     Past Surgical History:  Procedure Laterality Date  . arm surgery     torn bicept    There were no vitals filed for this visit.   Subjective Assessment - 09/10/18 1123    Subjective  Pt reports he was told he has 2 slipped discs resulting from a lifting injury with a bag of dog food while working for [employer] in November 2019. Had some relief from [facility] in February 2020 for first few weeks, then pain came back as bad as before. Has not been able to work since injury. Over past week has started noting more headaches and vision changes - pt encouraged to f/u with MD regarding this.    Limitations  Sitting;Standing;Walking    How long can you sit comfortably?  20 minutes    How long can you stand comfortably?  5-10 minutes    How long can you walk comfortably?  unable to run for exercise    Diagnostic tests  Lumbar MRI 05/27/18 - The dominant RIGHT-sided abnormality is at L4-5 where a central and rightward protrusion extends to the foramen. No stenosis. RIGHT L5, possibly RIGHT L4 nerve root impingement. Central and leftward extrusion at L5-S1 with a small associated discal cyst. Mild stenosis. LEFT S1, possibly LEFT L5 neural impingement. No RIGHT-sided compressive lesion at  the L5-S1 interspace.  Lumbar x-ray 05/01/18 - Mild facet degenerative changes at the L4-5 and L5-S1 levels. No acute abnormality.    Patient Stated Goals  "to get my mobilty back and move t/o day w/o pain - to have normality back"    Currently in Pain?  Yes    Pain Score  6    5-6/10 with movement, just tightness at rest   Pain Location  Back    Pain Orientation  Lower;Right    Pain Descriptors / Indicators  Sharp;Pressure;Radiating;Tightness    Pain Type  Acute pain    Pain Radiating Towards  pinching into B buttock & posterior R thigh and anterior/posterior L thigh with intermittent numbness & tingling    Pain Onset  More than a month ago   November 2019   Pain Frequency  Constant    Aggravating Factors   bending forward, leaning over to tie shoes    Pain Relieving Factors  sidellying with pillow between knees, hot shower, meds - ibuprofen    Effect of Pain on Daily Activities  unable to work; limits ability to work-out (difficulty running and unable to lift or do core work-outs); difficulty picking up 48 y/o dtr         [facility] PT Assessment - 09/10/18 1111  Assessment   Medical Diagnosis  Acute B LBP with B sciatica    Referring Provider (PT)  Karlton Lemon, MD    Onset Date/Surgical Date  04/16/18    Next MD Visit  not yet scheduled    Prior Therapy  none      Balance Screen   Has the patient fallen in the past 6 months  No    Has the patient had a decrease in activity level because of a fear of falling?   Yes    Is the patient reluctant to leave their home because of a fear of falling?   Yes      [place]  Private residence    Living Arrangements  Spouse/significant other;Children    Type of [place] to enter    Entrance Stairs-Number of Steps  [place]  One level    [place]  None      Prior Function   Level of Independence  Independent    Vocation   Unemployed   applying for disability   Leisure  working out 5 day/wk; playing with kids at park      Observation/Other Assessments   Focus on Therapeutic Outcomes (FOTO)   Lumbar - 31% (69% limitation); Predicted 56% (44% limitation)      ROM / Strength   AROM / PROM / Strength  AROM;Strength      AROM   AROM Assessment Site  Lumbar    Lumbar Flexion  hands to just below knees - pain & tightness & "clicking"    Lumbar Extension  50% limited - tightness    Lumbar - Right Side Bend  hand to just below knees - pain & tightness & "clicking"    Lumbar - Left Side Bend  hand to lateral knees - pain & tightness & "clicking"    Lumbar - Right Rotation  30% limited - pain & tightness    Lumbar - Left Rotation  30% limited - pain & tightness      Strength   Strength Assessment Site  Hip;Knee;Ankle    Right/Left Hip  Right;Left    Right Hip Flexion  4/5    Right Hip Extension  3+/5    Right Hip External Rotation   4-/5    Right Hip Internal Rotation  4/5    Right Hip ABduction  3-/5    Right Hip ADduction  3+/5    Left Hip Flexion  4-/5    Left Hip Extension  4-/5    Left Hip External Rotation  4-/5    Left Hip Internal Rotation  4/5    Left Hip ABduction  4-/5    Left Hip ADduction  4-/5    Right/Left Knee  Right;Left    Right Knee Flexion  3+/5    Right Knee Extension  4-/5    Left Knee Flexion  4-/5    Left Knee Extension  4/5    Right/Left Ankle  Right;Left    Right Ankle Dorsiflexion  4-/5    Right Ankle Plantar Flexion  4-/5    Left Ankle Dorsiflexion  4-/5    Left Ankle Plantar Flexion  3+/5      Flexibility   Soft Tissue Assessment /Muscle Length  yes    Hamstrings  mod tight B    Quadriceps  mild tight B  quads & hip flexors    ITB  mild/mod tight B    Piriformis  mild/mod tight B      Palpation   Spinal mobility  hypomobile t/o lumbar & lower throracic spine     Palpation comment  increased muscle tension & ttp t/o thoracolumbar paraspinals, glutes and piriformis       Special Tests    Special Tests  Lumbar    Lumbar Tests  Slump Test;Straight Leg Raise      Slump test   Findings  Negative    Comment  just tightness      Straight Leg Raise   Findings  Negative    Comment  just tightness                Objective measurements completed on examination: See above findings.      [facility] PT Treatment/Exercise - 09/10/18 1111      Exercises   Exercises  Lumbar      Lumbar Exercises: Stretches   Single Knee to Chest Stretch  Right;Left;30 seconds;2 reps    Prone on Elbows Stretch  30 seconds;2 reps    Piriformis Stretch  Right;Left;30 seconds;1 rep    Figure 4 Stretch  30 seconds;2 reps;Supine;With overpressure    Figure 4 Stretch Limitations  pt preferring this over KTOS      Lumbar Exercises: Supine   Pelvic Tilt  10 reps;5 seconds    Bridge Limitations  attempted but deferred d/t increased pain      Modalities   Modalities  Electrical Stimulation;Moist Heat      Moist Heat Therapy   Number Minutes Moist Heat  15 Minutes    Moist Heat Location  Lumbar Spine   in prone     Electrical Stimulation   Electrical Stimulation Location  Lumbar paraspinals    Electrical Stimulation Action  IFC    Electrical Stimulation Parameters  80-150 Hz, intensity to pt tol x 15'    Electrical Stimulation Goals  Pain;Tone             PT Education - 09/10/18 1238    Education Details  PT eval findings, anticipated POC and initial HEP    Person(s) Educated  Patient    Methods  Explanation;Demonstration;Handout    Comprehension  Verbalized understanding;Returned demonstration;Need further instruction          PT Long Term Goals - 09/10/18 1239      PT LONG TERM GOAL #1   Title  Independent with ongoing HEP +/- gym program    Status  New    [place] Date  10/22/18      PT LONG TERM GOAL #2   Title  Patient to demonstrate appropriate posture and body mechanics needed for daily activities    Status  New    Target Date   10/22/18      PT LONG TERM GOAL #3   Title  Patient to improve lumbar AROM to WFL/WNL without pain provocation     Status  New    Target Date  10/22/18      PT LONG TERM GOAL #4   Title  Patient will improve B LE strength to >/= 4/5 for improved stability     Status  New    Target Date  10/22/18      PT LONG TERM GOAL #5   Title  Patient to report ability to perform ADLs, household and work-related tasks without increased pain    Status  New    Target Date  10/22/18             Plan - 09/10/18 1238    Clinical Impression Statement  Tacari is a 48 y/o male who presents to OP PT for acute low back pain with B radiculopathy/sciatica originating from a workplace injury in November 2019. He notes some temporary relief from [facility] in February lasting a few weeks with pain returning to original intensity following this and now noting new headaches and vision changes over the past week - patient encouraged to f/u with MD regarding new symptoms. MRI as of 05/27/18 revealed R/central L4-5 and L/central L5-S1 disc protrusions with possible left S1 and possible L5 neural impingement.  Lumbar ROM limited in all planes due to pain and tightness with flexion, B side bending and B rotation most aggravating. Mild to moderate limitations noted in proximal LE flexibility as well as mild to moderate B LE weakness, R>L. Pain currently prevents him from working, caring for his 2 y/o daughter, and limits his ability to workout causing him to gain ~20# since the initial injury. Mikai will benefit from skilled PT intervention to address the above listed deficits and to allow for resumption of prior activity level and workout routine without limitation due to LBP or B LE radiculopathy. Initial exercise tolerance limited due to pain, but good relief noted from I[facility] estim - will forward referral info to [facility] at patient request for home TENS-IT unit.    Personal Factors and Comorbidities  Comorbidity 1;Past/Current  Experience;Profession;Time since onset of injury/illness/exacerbation    Comorbidities  HTN    Examination-Activity Limitations  Bed Mobility;Bend;Caring for Others;Carry;Lift;Locomotion Level;Reach Overhead;Sit;Sleep;Squat;Stand;Transfers    Examination-Participation Restrictions  Community Activity;Driving;Yard Work    [employer]chant [employer] officer  Evolving/Moderate complexity    Clinical Decision Making  Moderate    Rehab Potential  Good    PT Frequency  2x / week   pt wishing to start 1x/wk due to high copay   PT Duration  6 weeks    PT Treatment/Interventions  Patient/family education;Neuromuscular re-education;Therapeutic exercise;Therapeutic activities;Functional mobility training;Manual techniques;Passive range of motion;Dry needling;Taping;Spinal Manipulations;Joint Manipulations;Electrical Stimulation;Moist Heat;Ultrasound;Traction;Iontophoresis 45m/ml Dexamethasone;Cryotherapy;ADLs/Self Care Home Management    PT Next Visit Plan  Review initial HEP; Posture & body mechanics training; Exercise progression per directional preference as indicated; Manual therapy and modalities as indicated    Consulted and Agree with Plan of Care  Patient       Patient will benefit from skilled therapeutic intervention in order to improve the following deficits and impairments:  Pain, Hypomobility, Increased muscle spasms, Impaired flexibility, Decreased range of motion, Decreased strength, Decreased activity tolerance, Impaired perceived functional ability, Improper body mechanics, Postural dysfunction, Difficulty walking  Visit Diagnosis: Acute bilateral low back pain with bilateral sciatica  Radiculopathy, lumbar region  Muscle spasm of back  Muscle weakness (generalized)  Abnormal posture  Other symptoms and signs involving the musculoskeletal system     Problem List Patient Active Problem List   Diagnosis Date Noted  . Low back pain 09/14/2017  . Chest pain 12/15/2015  .  Lower leg injury 01/19/2015    JPercival Spanish PT, MPT 09/10/2018, 1:58 PM  [facility] [place] [address]Phone: [phone]  Fax:  [phone] Name: DKenwood RosiakMRN: [id number] of Birth: [birth year]  PHYSICAL THERAPY DISCHARGE SUMMARY  Visits from Start of Care: 1  Current functional level related to goals /  functional outcomes:   Refer to above initial eval. Patient no showed for or cancelled all f/u appointments, therefore discharged per Cx/NS policy.   Remaining deficits:   As above.   Education / Equipment:   Initial HEP  Plan: Patient agrees to discharge.  Patient goals were not met. Patient is being discharged due to the patient's request.  ?????    Percival Spanish, PT, MPT 10/10/18, 9:51 AM  [facility] [address]  [place], [place], [address] Phone: [phone]   Fax:  [phone]

## 2018-09-17 ENCOUNTER — Ambulatory Visit: Payer: 59 | Admitting: Physical Therapy

## 2018-09-24 ENCOUNTER — Ambulatory Visit: Payer: 59

## 2018-10-01 ENCOUNTER — Ambulatory Visit: Payer: 59 | Admitting: Physical Therapy

## 2018-10-08 ENCOUNTER — Ambulatory Visit: Payer: 59 | Attending: Family Medicine

## 2018-10-15 ENCOUNTER — Ambulatory Visit: Payer: 59

## 2018-10-22 ENCOUNTER — Ambulatory Visit: Payer: 59 | Admitting: Physical Therapy

## 2019-02-17 ENCOUNTER — Encounter: Payer: Self-pay | Admitting: Family Medicine

## 2019-02-17 ENCOUNTER — Ambulatory Visit (INDEPENDENT_AMBULATORY_CARE_PROVIDER_SITE_OTHER): Payer: Self-pay | Admitting: Family Medicine

## 2019-02-17 ENCOUNTER — Other Ambulatory Visit: Payer: Self-pay

## 2019-02-17 DIAGNOSIS — M7712 Lateral epicondylitis, left elbow: Secondary | ICD-10-CM | POA: Insufficient documentation

## 2019-02-17 DIAGNOSIS — G5622 Lesion of ulnar nerve, left upper limb: Secondary | ICD-10-CM

## 2019-02-17 MED ORDER — PREDNISONE 5 MG PO TABS: ORAL_TABLET | ORAL | 0 refills | Status: DC

## 2019-02-17 NOTE — Patient Instructions (Signed)
Nice to meet you Please try the medicine. You can use the rub on medicine as needed Please try taking a vitamin B complex   Please try the exercises  Please send me a message in MyChart with any questions or updates.  Please see me back in 4 weeks.   --Dr. Raeford Razor

## 2019-02-17 NOTE — Assessment & Plan Note (Signed)
Demonstrating pain at the lateral epicondyle but no significant pain on palpation.  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 -Prednisone. -Pennsaid samples. -If no improvement can consider injection or physical therapy.

## 2019-02-17 NOTE — Progress Notes (Signed)
Ronald [name] - 48 y.o. male MRN [id number]  Date of birth: 03-25-1971  SUBJECTIVE:  Including CC & ROS.  Chief Complaint  Patient presents with  . Arm Pain    left forearm x 1 week    Ronald [name] is a 48 y.o. male that is presenting with ulnar-sided arm numbness and pain at the elbow.  The symptoms been present for about 1 week.  He does not endorse any specific inciting event.  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  No previous surgery in the area.  Has not taken anything for the altered sensation.  He is active and works out on a regular basis.  He noticed the symptoms worse with a pull-up.  Denies any symptoms proximally from the elbow..   Review of Systems  Constitutional: Negative for fever.  HENT: Negative for congestion.   Respiratory: Negative for cough.   Cardiovascular: Negative for chest pain.  Gastrointestinal: Negative for abdominal pain.  Musculoskeletal: Negative for back pain.  Neurological: Positive for numbness.  Hematological: Negative for adenopathy.  Psychiatric/Behavioral: Negative for agitation.    HISTORY: Past Medical, Surgical, Social, and Family History Reviewed & Updated per EMR.   Pertinent Historical Findings include:  Past Medical History:  Diagnosis Date  . Hypertension     Past Surgical History:  Procedure Laterality Date  . arm surgery     torn bicept    Allergies  Allergen Reactions  . Penicillins Anaphylaxis    Has patient had a PCN reaction causing immediate rash, facial/tongue/throat swelling, SOB or lightheadedness with hypotension: yes Has patient had a PCN reaction causing severe rash involving mucus membranes or skin necrosis: unknown Has patient had a PCN reaction that required hospitalization yes Has patient had a PCN reaction occurring within the last 10 years: no If all of the above answers are "NO", then may proceed with Cephalosporin use. \  . Sulfa Antibiotics Anaphylaxis    Family History  Problem  Relation Age of Onset  . Hypertension Mother   . Hypertension Father   . Cancer Other      Social History   Socioeconomic History  . Marital status: Divorced    Spouse name: Not on file  . Number of children: 7  . Years of education: Not on file  . Highest education level: Not on file  Occupational History  . Not on file  Social Needs  . Financial resource strain: Not on file  . Food insecurity    Worry: Not on file    Inability: Not on file  . Transportation needs    Medical: Not on file    Non-medical: Not on file  Tobacco Use  . Smoking status: Former Smoker    Packs/day: 0.00    Types: Cigars    Quit date: 05/11/2015    Years since quitting: 3.7  . Smokeless tobacco: Never Used  Substance and Sexual Activity  . Alcohol use: Not Currently    Alcohol/week: 0.0 standard drinks  . Drug use: No  . Sexual activity: Not on file  Lifestyle  . Physical activity    Days per week: Not on file    Minutes per session: Not on file  . Stress: Not on file  Relationships  . Social Herbalist on phone: Not on file    Gets together: Not on file    Attends religious service: Not on file    Active member of club or organization: Not on file  Attends meetings of clubs or organizations: Not on file    Relationship status: Not on file  . Intimate partner violence    Fear of current or ex partner: Not on file    Emotionally abused: Not on file    Physically abused: Not on file    Forced sexual activity: Not on file  Other Topics Concern  . Not on file  Social History Narrative  . Not on file     PHYSICAL EXAM:  VS: BP 129/90   Ht 5\' 9"  (1.753 m)   Wt 180 lb (81.6 kg)   BMI 26.58 kg/m  Physical Exam Gen: NAD, alert, cooperative with exam, well-appearing ENT: normal lips, normal nasal mucosa,  Eye: normal EOM, normal conjunctiva and lids CV:  no edema, +2 pedal pulses   Resp: no accessory muscle use, non-labored,  Skin: no rashes, no areas of induration   Neuro: normal tone, normal sensation to touch Psych:  normal insight, alert and oriented MSK:  Left elbow: Normal range of motion. No significant tenderness palpation of the lateral epicondyle. Normal strength resistance with supination and pronation. Negative Tinel's at the cubital tunnel. Left wrist: Normal range of motion. Normal strength resistance with flexion and extension. Negative Tinel's at the wrist. No signs of atrophy. Neurovascular intact     ASSESSMENT & PLAN:   Ulnar neuropathy at elbow of left upper extremity Symptoms seem most consistent with ulnar nerve.  No specific mechanism.  Seems to be more at the elbow as opposed to [place]. -Prednisone. -Counseled on home exercise therapy and supportive care. -Provided samples of pennsaid - provided work note with limitations. -If no improvement can consider injection at the elbow or physical therapy.  Lateral epicondylitis of left elbow Demonstrating pain at the lateral epicondyle but no significant pain on palpation.  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 -Prednisone. -Pennsaid samples. -If no improvement can consider injection or physical therapy.

## 2019-02-17 NOTE — Assessment & Plan Note (Addendum)
Symptoms seem most consistent with ulnar nerve.  No specific mechanism.  Seems to be more at the elbow as opposed to [place]. -Prednisone. -Counseled on home exercise therapy and supportive care. -Provided samples of pennsaid - provided work note with limitations. -If no improvement can consider injection at the elbow or physical therapy.

## 2019-02-17 NOTE — Progress Notes (Signed)
Medication Samples have been provided to the patient.  Drug name: Pennsaid       Strength: 2%        Qty: 2 Boxes  [id number]  Exp.Date: 06/2019  Dosing instructions: Use a peasize amount and rub gently.  The patient has been instructed regarding the correct time, dose, and frequency of taking this medication, including desired effects and most common side effects.   Sherrie George, [place] 4:42 PM 02/17/2019

## 2019-03-03 ENCOUNTER — Ambulatory Visit: Payer: Self-pay | Admitting: Family Medicine

## 2019-03-03 NOTE — Progress Notes (Deleted)
Ronald [name] - 48 y.o. male MRN [id number]  Date of birth: December 01, 1970  SUBJECTIVE:  Including CC & ROS.  No chief complaint on file.   Ronald [name] is a 48 y.o. male that is  ***.  ***   Review of Systems  HISTORY: Past Medical, Surgical, Social, and Family History Reviewed & Updated per EMR.   Pertinent Historical Findings include:  Past Medical History:  Diagnosis Date  . Hypertension     Past Surgical History:  Procedure Laterality Date  . arm surgery     torn bicept    Allergies  Allergen Reactions  . Penicillins Anaphylaxis    Has patient had a PCN reaction causing immediate rash, facial/tongue/throat swelling, SOB or lightheadedness with hypotension: yes Has patient had a PCN reaction causing severe rash involving mucus membranes or skin necrosis: unknown Has patient had a PCN reaction that required hospitalization yes Has patient had a PCN reaction occurring within the last 10 years: no If all of the above answers are "NO", then may proceed with Cephalosporin use. \  . Sulfa Antibiotics Anaphylaxis    Family History  Problem Relation Age of Onset  . Hypertension Mother   . Hypertension Father   . Cancer Other      Social History   Socioeconomic History  . Marital status: Divorced    Spouse name: Not on file  . Number of children: 7  . Years of education: Not on file  . Highest education level: Not on file  Occupational History  . Not on file  Social Needs  . Financial resource strain: Not on file  . Food insecurity    Worry: Not on file    Inability: Not on file  . Transportation needs    Medical: Not on file    Non-medical: Not on file  Tobacco Use  . Smoking status: Former Smoker    Packs/day: 0.00    Types: Cigars    Quit date: 05/11/2015    Years since quitting: 3.8  . Smokeless tobacco: Never Used  Substance and Sexual Activity  . Alcohol use: Not Currently    Alcohol/week: 0.0 standard drinks  . Drug use: No  . Sexual activity: Not  on file  Lifestyle  . Physical activity    Days per week: Not on file    Minutes per session: Not on file  . Stress: Not on file  Relationships  . Social Herbalist on phone: Not on file    Gets together: Not on file    Attends religious service: Not on file    Active member of club or organization: Not on file    Attends meetings of clubs or organizations: Not on file    Relationship status: Not on file  . Intimate partner violence    Fear of current or ex partner: Not on file    Emotionally abused: Not on file    Physically abused: Not on file    Forced sexual activity: Not on file  Other Topics Concern  . Not on file  Social History Narrative  . Not on file     PHYSICAL EXAM:  VS: There were no vitals taken for this visit. Physical Exam Gen: NAD, alert, cooperative with exam, well-appearing ENT: normal lips, normal nasal mucosa,  Eye: normal EOM, normal conjunctiva and lids CV:  no edema, +2 pedal pulses   Resp: no accessory muscle use, non-labored,  GI: no masses or tenderness, no hernia  Skin:  no rashes, no areas of induration  Neuro: normal tone, normal sensation to touch Psych:  normal insight, alert and oriented MSK:  ***      ASSESSMENT & PLAN:   No problem-specific Assessment & Plan notes found for this encounter.

## 2019-03-05 ENCOUNTER — Other Ambulatory Visit: Payer: Self-pay

## 2019-03-05 ENCOUNTER — Ambulatory Visit: Payer: Self-pay

## 2019-03-05 ENCOUNTER — Ambulatory Visit (INDEPENDENT_AMBULATORY_CARE_PROVIDER_SITE_OTHER): Payer: Self-pay | Admitting: Family Medicine

## 2019-03-05 ENCOUNTER — Encounter: Payer: Self-pay | Admitting: Family Medicine

## 2019-03-05 VITALS — BP 137/91 | HR 76 | Ht 69.0 in | Wt 180.0 lb

## 2019-03-05 DIAGNOSIS — M7712 Lateral epicondylitis, left elbow: Secondary | ICD-10-CM

## 2019-03-05 DIAGNOSIS — G5622 Lesion of ulnar nerve, left upper limb: Secondary | ICD-10-CM

## 2019-03-05 MED ORDER — NITROGLYCERIN 0.2 MG/HR TD PT24: MEDICATED_PATCH | TRANSDERMAL | 11 refills | Status: DC

## 2019-03-05 MED ORDER — GABAPENTIN 100 MG PO CAPS: 100.0000 mg | ORAL_CAPSULE | Freq: Three times a day (TID) | ORAL | 1 refills | Status: DC

## 2019-03-05 NOTE — Patient Instructions (Addendum)
Good to see you Please try the nitro patches. Please stop them if you develop a headache that lasts days  Please try the gabapentin. You can use this just at night. You can increase to 2 or 3 times daily as you tolerate.  Please try compression   Please send me a message in MyChart with any questions or updates.  Please follow up after your nerve study. We can do a virtual visit or in person.   --Dr. Raeford Razor  Nitroglycerin Protocol   Apply 1/4 nitroglycerin patch to affected area daily.  Change position of patch within the affected area every 24 hours.  You may experience a headache during the first 1-2 weeks of using the patch, these should subside.  If you experience headaches after beginning nitroglycerin patch treatment, you may take your preferred over the counter pain reliever.  Another side effect of the nitroglycerin patch is skin irritation or rash related to patch adhesive.  Please notify our office if you develop more severe headaches or rash, and stop the patch.  Tendon healing with nitroglycerin patch may require 12 to 24 weeks depending on the extent of injury.  Men should not use if taking Viagra, Cialis, or Levitra.   Do not use if you have migraines or rosacea.

## 2019-03-05 NOTE — Progress Notes (Signed)
Ronald [name] - 48 y.o. male MRN [id number]  Date of birth: 12/31/1970  SUBJECTIVE:  Including CC & ROS.  Chief Complaint  Patient presents with  . Follow-up    follow up for left arm    Ronald [name] is a 48 y.o. male that is following up for left elbow pain and left arm numbness.  He was prescribed some prednisone and that did not improve his symptoms whatsoever.  Pain is still occurring over the lateral epicondyle.  This pain is intermittent in nature.  It ranges from moderate to severe.  He denies any lack of range of motion.  He also demonstrates numbness that originates from the cubital tunnel and extends down to the fourth and fifth digit of the left hand.  He denies any new or different exercises.  He experiences this numbness at night.  He denies improvement with the prednisone.  Denies any surgery or specific inciting event..    Review of Systems  Constitutional: Negative for fever.  HENT: Negative for congestion.   Respiratory: Negative for cough.   Cardiovascular: Negative for chest pain.  Gastrointestinal: Negative for abdominal pain.  Musculoskeletal: Negative for gait problem.  Skin: Negative for color change.  Neurological: Positive for numbness.  Hematological: Negative for adenopathy.    HISTORY: Past Medical, Surgical, Social, and Family History Reviewed & Updated per EMR.   Pertinent Historical Findings include:  Past Medical History:  Diagnosis Date  . Hypertension     Past Surgical History:  Procedure Laterality Date  . arm surgery     torn bicept    Allergies  Allergen Reactions  . Penicillins Anaphylaxis    Has patient had a PCN reaction causing immediate rash, facial/tongue/throat swelling, SOB or lightheadedness with hypotension: yes Has patient had a PCN reaction causing severe rash involving mucus membranes or skin necrosis: unknown Has patient had a PCN reaction that required hospitalization yes Has patient had a PCN reaction occurring within the  last 10 years: no If all of the above answers are "NO", then may proceed with Cephalosporin use. \  . Sulfa Antibiotics Anaphylaxis    Family History  Problem Relation Age of Onset  . Hypertension Mother   . Hypertension Father   . Cancer Other      Social History   Socioeconomic History  . Marital status: Divorced    Spouse name: Not on file  . Number of children: 7  . Years of education: Not on file  . Highest education level: Not on file  Occupational History  . Not on file  Social Needs  . Financial resource strain: Not on file  . Food insecurity    Worry: Not on file    Inability: Not on file  . Transportation needs    Medical: Not on file    Non-medical: Not on file  Tobacco Use  . Smoking status: Former Smoker    Packs/day: 0.00    Types: Cigars    Quit date: 05/11/2015    Years since quitting: 3.8  . Smokeless tobacco: Never Used  Substance and Sexual Activity  . Alcohol use: Not Currently    Alcohol/week: 0.0 standard drinks  . Drug use: No  . Sexual activity: Not on file  Lifestyle  . Physical activity    Days per week: Not on file    Minutes per session: Not on file  . Stress: Not on file  Relationships  . Social connections    Talks on phone: Not on file  Gets together: Not on file    Attends religious service: Not on file    Active member of club or organization: Not on file    Attends meetings of clubs or organizations: Not on file    Relationship status: Not on file  . Intimate partner violence    Fear of current or ex partner: Not on file    Emotionally abused: Not on file    Physically abused: Not on file    Forced sexual activity: Not on file  Other Topics Concern  . Not on file  Social History Narrative  . Not on file     PHYSICAL EXAM:  VS: BP (!) 137/91   Pulse 76   Ht 5\' 9"  (1.753 m)   Wt 180 lb (81.6 kg)   BMI 26.58 kg/m  Physical Exam Gen: NAD, alert, cooperative with exam, well-appearing ENT: normal lips, normal  nasal mucosa,  Eye: normal EOM, normal conjunctiva and lids CV:  no edema, +2 pedal pulses   Resp: no accessory muscle use, non-labored,  Skin: no rashes, no areas of induration  Neuro: normal tone, normal sensation to touch Psych:  normal insight, alert and oriented MSK:  Left elbow: Normal range of motion. Tenderness palpation of the lateral epicondyle. Normal strength resistance with flexion extension. Normal strength resistance with supination and pronation. No redness or swelling. Normal grip strength. Normal pincer grasp. Normal strength resistance with finger abduction and abduction. Neurovascular intact  Limited ultrasound: Left elbow:  No joint effusion. There appears to be mucus changes at the origin of the common extensors at the lateral epicondyle.  There is no hyperemia in this area. Ulnar nerve in the cubital tunnel is measured to be 0.11.  The ulnar nerve in the right cubital tunnel was 0.  9. There is no translocation of the ulnar nerve in the cubital tunnel upon dynamic testing.  Summary: Findings would suggest lateral epicondylitis tendinopathy and mild cubital tunnel syndrome.  Ultrasound and interpretation by Clearance Coots, MD    ASSESSMENT & PLAN:   Lateral epicondylitis of left elbow Symptoms are ongoing.  Does seem to have mucus changes to represent tendinopathy at the lateral epicondyle.  No significant hyperemia on ultrasound. - nitro patches. Counseled on their use  - counseled on compression  - may consider PT or injection   Ulnar neuropathy at elbow of left upper extremity Appears to be mildly enlarged compared to contralateral side. Symptoms are worse with limit change with the prednisone  - Nerve study  - gabapentin  - counseled on supportive care - consider injection if no improvement.

## 2019-03-05 NOTE — Assessment & Plan Note (Addendum)
Symptoms are ongoing.  Does seem to have mucus changes to represent tendinopathy at the lateral epicondyle.  No significant hyperemia on ultrasound. - nitro patches. Counseled on their use  - counseled on compression  - may consider PT or injection

## 2019-03-05 NOTE — Assessment & Plan Note (Signed)
Appears to be mildly enlarged compared to contralateral side. Symptoms are worse with limit change with the prednisone  - Nerve study  - gabapentin  - counseled on supportive care - consider injection if no improvement.

## 2019-03-17 ENCOUNTER — Ambulatory Visit: Payer: Self-pay | Admitting: Family Medicine

## 2019-03-24 ENCOUNTER — Other Ambulatory Visit: Payer: Self-pay

## 2019-03-24 ENCOUNTER — Emergency Department (HOSPITAL_BASED_OUTPATIENT_CLINIC_OR_DEPARTMENT_OTHER)
Admission: EM | Admit: 2019-03-24 | Discharge: 2019-03-25 | Disposition: A | Discharge status: Home / Self Care | Payer: Self-pay | Attending: Emergency Medicine | Admitting: Emergency Medicine

## 2019-03-24 ENCOUNTER — Encounter (HOSPITAL_BASED_OUTPATIENT_CLINIC_OR_DEPARTMENT_OTHER): Payer: Self-pay

## 2019-03-24 DIAGNOSIS — L299 Pruritus, unspecified: Secondary | ICD-10-CM | POA: Insufficient documentation

## 2019-03-24 DIAGNOSIS — R0781 Pleurodynia: Secondary | ICD-10-CM | POA: Insufficient documentation

## 2019-03-24 DIAGNOSIS — Z87891 Personal history of nicotine dependence: Secondary | ICD-10-CM | POA: Insufficient documentation

## 2019-03-24 DIAGNOSIS — I10 Essential (primary) hypertension: Secondary | ICD-10-CM | POA: Insufficient documentation

## 2019-03-24 NOTE — ED Triage Notes (Signed)
Pt c/o "itching" to right rib area x 2 months-wife noticed a lump to area tonight-denies injury to area-NAD-steady gait

## 2019-03-25 ENCOUNTER — Emergency Department (HOSPITAL_BASED_OUTPATIENT_CLINIC_OR_DEPARTMENT_OTHER): Payer: Self-pay

## 2019-03-25 NOTE — Discharge Instructions (Addendum)
You can try benadryl or atarax for your itching, they will make you sleepy so only take at night.

## 2019-03-25 NOTE — ED Provider Notes (Signed)
Emergency Department Provider Note   I have reviewed the triage vital signs and the nursing notes.   HISTORY  Chief Complaint Mass   HPI Ronald [name] is a 48 y.o. male who presents to the emergency department today with rib pain and concern for mass.  Patient states he had itching basically in the fifth dermatome for the last couple months.  He states he notices it often.  He and his wife have been desperately trying to figure out what the causes.  Is never had a rash.  Not had any specific noticeable skin abnormalities to explain it.  Patient states that tonight his wife was given a back rub and noticed that there was an area of abnormal swelling on one of his ribs he presents here for further evaluation.  No chest pain, shortness of breath or other associated symptoms.  He specifically asked me if the itching could be related to anxiety.  No liver or kidney problems.  Has had chickenpox but has not had any rashes to suggest zoster.   No other associated or modifying symptoms.    Past Medical History:  Diagnosis Date  . Hypertension     Patient Active Problem List   Diagnosis Date Noted  . Ulnar neuropathy at elbow of left upper extremity 02/17/2019  . Lateral epicondylitis of left elbow 02/17/2019  . Low back pain 09/14/2017  . Chest pain 12/15/2015  . Lower leg injury 01/19/2015    Past Surgical History:  Procedure Laterality Date  . arm surgery     torn bicept    Current Outpatient Rx  . Order #: [id number] Class: Print  . Order #: [id number] Class: Historical Med  . Order #: [id number] Class: Normal  . Order #: [id number] Class: Print  . Order #: [id number] Class: Print  . Order #: [id number] Class: Normal  . Order #: [id number] Class: Normal  . Order #: [id number] Class: Print  . Order #: [id number] Class: Normal  . Order #: [id number] Class: Historical Med    Allergies Penicillins and Sulfa antibiotics  Family History  Problem Relation Age of Onset  . Hypertension Mother    . Hypertension Father   . Cancer Other     Social History Social History   Tobacco Use  . Smoking status: Former Smoker    Packs/day: 0.00    Types: Cigars    Quit date: 05/11/2015    Years since quitting: 3.8  . Smokeless tobacco: Never Used  Substance Use Topics  . Alcohol use: Not Currently    Alcohol/week: 0.0 standard drinks  . Drug use: No    Review of Systems  All other systems negative except as documented in the HPI. All pertinent positives and negatives as reviewed in the HPI. ____________________________________________   PHYSICAL EXAM:  VITAL SIGNS: ED Triage Vitals  Enc Vitals Group     BP 03/24/19 2228 (!) 158/115     Pulse Rate 03/24/19 2228 67     Resp 03/24/19 2228 16     Temp 03/24/19 2228 97.8 F (36.6 C)     Temp Source 03/24/19 2228 Oral     SpO2 03/24/19 2228 100 %     Weight 03/24/19 2229 182 lb (82.6 kg)     Height 03/24/19 2229 5\' 9"  (1.753 m)    Constitutional: Alert and oriented. Well appearing and in no acute distress. Eyes: Conjunctivae are normal. PERRL. EOMI. Head: Atraumatic. Nose: No congestion/rhinnorhea. Mouth/Throat: Mucous membranes are moist.  Oropharynx non-erythematous. Neck: No stridor.  No meningeal signs.   Cardiovascular:  Normal rate, regular rhythm. Good peripheral circulation. Grossly normal heart sounds.   Respiratory: Normal respiratory effort.  No retractions. Lungs CTAB. Gastrointestinal: Soft and nontender. No distention.  Musculoskeletal: No lower extremity tenderness nor edema. No gross deformities of extremities. Abnormal texture of rib near where it meets the vertebrae (fifth or sixth rib) Neurologic:  Normal speech and language. No gross focal neurologic deficits are appreciated.  Skin:  Skin is warm, dry and intact. No rash noted. No discoloration. Scattered areas of mild folliculitis.   ____________________________________________   RADIOLOGY  Dg Ribs Unilateral W/chest Right  Result Date:  03/25/2019 CLINICAL DATA:  48 year old male with itching in the right rib area x2 months. EXAM: RIGHT RIBS AND CHEST - 3+ VIEW COMPARISON:  Chest radiograph dated 07/11/2017 FINDINGS: The lungs are clear. There is no pleural effusion or pneumothorax. The cardiac silhouette is within normal limits. No acute osseous pathology. No displaced rib fractures. IMPRESSION: 1. No active cardiopulmonary disease. 2. No displaced rib fractures. Electronically Signed   By: Anner Crete M.D.   On: 03/25/2019 01:00    ____________________________________________   PROCEDURES  Procedure(s) performed:   Procedures   ____________________________________________   INITIAL IMPRESSION / ASSESSMENT AND PLAN / ED COURSE  xr ok. No e/o systemic disease as cause for symptoms. Refer to PCP.     Pertinent labs & imaging results that were available during my care of the patient were reviewed by me and considered in my medical decision making (see chart for details).  A medical screening exam was performed and I feel the patient has had an appropriate workup for their chief complaint at this time and likelihood of emergent condition existing is low. They have been counseled on decision, discharge, follow up and which symptoms necessitate immediate return to the emergency department. They or their family verbally stated understanding and agreement with plan and discharged in stable condition.   ____________________________________________  FINAL CLINICAL IMPRESSION(S) / ED DIAGNOSES  Final diagnoses:  Rib pain  Pruritus     MEDICATIONS GIVEN DURING THIS VISIT:  Medications - No data to display   NEW OUTPATIENT MEDICATIONS STARTED DURING THIS VISIT:  New Prescriptions   No medications on file    Note:  This note was prepared with assistance of Dragon voice recognition software. Occasional wrong-word or sound-a-like substitutions may have occurred due to the inherent limitations of voice  recognition software.   Sherrye Puga, Corene Cornea, MD 03/25/19 [phone]

## 2019-04-13 ENCOUNTER — Encounter (HOSPITAL_BASED_OUTPATIENT_CLINIC_OR_DEPARTMENT_OTHER): Payer: Self-pay | Admitting: Emergency Medicine

## 2019-04-13 ENCOUNTER — Emergency Department (HOSPITAL_BASED_OUTPATIENT_CLINIC_OR_DEPARTMENT_OTHER)
Admission: EM | Admit: 2019-04-13 | Discharge: 2019-04-13 | Disposition: A | Discharge status: Home / Self Care | Payer: Self-pay | Attending: Emergency Medicine | Admitting: Emergency Medicine

## 2019-04-13 ENCOUNTER — Other Ambulatory Visit: Payer: Self-pay

## 2019-04-13 ENCOUNTER — Emergency Department (HOSPITAL_BASED_OUTPATIENT_CLINIC_OR_DEPARTMENT_OTHER): Payer: Self-pay

## 2019-04-13 DIAGNOSIS — Z87891 Personal history of nicotine dependence: Secondary | ICD-10-CM | POA: Insufficient documentation

## 2019-04-13 DIAGNOSIS — I10 Essential (primary) hypertension: Secondary | ICD-10-CM | POA: Insufficient documentation

## 2019-04-13 DIAGNOSIS — S63634A Sprain of interphalangeal joint of right ring finger, initial encounter: Secondary | ICD-10-CM

## 2019-04-13 DIAGNOSIS — S63635A Sprain of interphalangeal joint of left ring finger, initial encounter: Secondary | ICD-10-CM | POA: Insufficient documentation

## 2019-04-13 DIAGNOSIS — W228XXA Striking against or struck by other objects, initial encounter: Secondary | ICD-10-CM | POA: Insufficient documentation

## 2019-04-13 DIAGNOSIS — Y92810 Car as the place of occurrence of the external cause: Secondary | ICD-10-CM | POA: Insufficient documentation

## 2019-04-13 DIAGNOSIS — Y999 Unspecified external cause status: Secondary | ICD-10-CM | POA: Insufficient documentation

## 2019-04-13 DIAGNOSIS — Y9389 Activity, other specified: Secondary | ICD-10-CM | POA: Insufficient documentation

## 2019-04-13 DIAGNOSIS — Z79899 Other long term (current) drug therapy: Secondary | ICD-10-CM | POA: Insufficient documentation

## 2019-04-13 MED ORDER — HYDROCHLOROTHIAZIDE 25 MG PO TABS: 25.0000 mg | ORAL_TABLET | Freq: Every day | ORAL | 0 refills | Status: AC

## 2019-04-13 MED FILL — HYDROCHLOROTHIAZIDE 25 MG T: 25 | 30 days supply | Qty: 30 | Fill #0

## 2019-04-13 NOTE — Discharge Instructions (Signed)
You have been diagnosed today with Finger Sprain.  At this time there does not appear to be the presence of an emergent medical condition, however there is always the potential for conditions to change. Please read and follow the below instructions.  Please return to the Emergency Department immediately for any new or worsening symptoms. Please be sure to follow up with your Primary Care Provider within one week regarding your visit today; please call their office to schedule an appointment even if you are feeling better for a follow-up visit. Your x-ray today was negative for fracture or dislocation however sprains or other soft tissue injury may still be present.  Please use the finger splint given to you today to protect your finger, use rest ice and elevation to help with pain and swelling.  Please call the hand specialist Dr. Jeannie Fend on your discharge paperwork to schedule a follow-up appointment for further evaluation and treatment.   Your blood pressure medicine has been refilled today, please take only as prescribed and call your primary care provider's office today to schedule a follow-up appointment for blood pressure recheck and medication management within 1 week.  Get help right away if: Your bruising or swelling gets worse. Your splint or cast is damaged. Your finger is numb or blue. You develop redness or worsening of swelling Your finger feels colder to the touch than normal. You develop a fever.  Get help right away if you: Get a very bad headache. Start to feel mixed up (confused). Feel weak or numb. Feel faint. Have very bad pain in your: Chest. Belly (abdomen). Throw up more than once. Have trouble breathing. You have any new/concerning or worsening of symptoms  Please read the additional information packets attached to your discharge summary.  Do not take your medicine if  develop an itchy rash, swelling in your mouth or lips, or difficulty breathing; call 911  and seek immediate emergency medical attention if this occurs.  Note: Portions of this text may have been transcribed using voice recognition software. Every effort was made to ensure accuracy; however, inadvertent computerized transcription errors may still be present.

## 2019-04-13 NOTE — ED Provider Notes (Addendum)
[facility] EMERGENCY DEPARTMENT Provider Note   CSN: [id number] Arrival date & time: 04/13/19  0856     History   Chief Complaint Chief Complaint  Patient presents with  . Finger Injury    HPI Ronald [name] is a 48 y.o. male presents today for pain and swelling of the left fourth finger.  Patient reports he was reaching into the trunk of his car approximately 2 weeks ago when he "jammed" the finger.  Patient believes that he heard a "crack".  Patient reports that since that time he has had a moderate throbbing pain to his left fourth PIP constant worsened with movement and palpation, improved with rest, nonradiating.  He reports he has had swelling of the left fourth finger since that time and is concerned for fracture today.  Additionally patient is requesting refill of home blood pressure medication hydrochlorothiazide 25 mg daily and referral to PCP.  Denies fevers/chills, headache/vision changes, chest pain/shortness of breath, abdominal pain, nausea/vomiting, numbness/weakness, tingling, erythema, bleeding or any additional concerns today.     HPI  Past Medical History:  Diagnosis Date  . Hypertension     Patient Active Problem List   Diagnosis Date Noted  . Ulnar neuropathy at elbow of left upper extremity 02/17/2019  . Lateral epicondylitis of left elbow 02/17/2019  . Low back pain 09/14/2017  . Chest pain 12/15/2015  . Lower leg injury 01/19/2015    Past Surgical History:  Procedure Laterality Date  . arm surgery     torn bicept        Home Medications    Prior to Admission medications   Medication Sig Start Date End Date Taking? Authorizing Provider  hydrochlorothiazide (HYDRODIURIL) 25 MG tablet Take 1 tablet (25 mg total) by mouth daily. 04/13/19   Deliah Boston, PA-C    Family History Family History  Problem Relation Age of Onset  . Hypertension Mother   . Hypertension Father   . Cancer Other     Social History Social History    Tobacco Use  . Smoking status: Former Smoker    Packs/day: 0.00    Types: Cigars    Quit date: 05/11/2015    Years since quitting: 3.9  . Smokeless tobacco: Never Used  Substance Use Topics  . Alcohol use: Not Currently    Alcohol/week: 0.0 standard drinks  . Drug use: No     Allergies   Penicillins and Sulfa antibiotics   Review of Systems Review of Systems Ten systems are reviewed and are negative for acute change except as noted in the HPI   Physical Exam Updated Vital Signs BP (!) 131/91 (BP Location: Left Arm)   Pulse 73   Temp 98.3 F (36.8 C) (Oral)   Resp 16   Ht 5\' 9"  (1.753 m)   Wt 80.6 kg   SpO2 100%   BMI 26.24 kg/m   Physical Exam Constitutional:      General: He is not in acute distress.    Appearance: Normal appearance. He is well-developed. He is not ill-appearing or diaphoretic.  HENT:     Head: Normocephalic and atraumatic.     Right Ear: External ear normal.     Left Ear: External ear normal.     Nose: Nose normal.  Eyes:     General: Vision grossly intact. Gaze aligned appropriately.     Pupils: Pupils are equal, round, and reactive to light.  Neck:     Musculoskeletal: Normal range of motion.  Trachea: Trachea and phonation normal. No tracheal deviation.  Cardiovascular:     Rate and Rhythm: Normal rate and regular rhythm.     Pulses:          Radial pulses are 2+ on the right side and 2+ on the left side.  Pulmonary:     Effort: Pulmonary effort is normal. No respiratory distress.  Abdominal:     General: There is no distension.     Palpations: Abdomen is soft.     Tenderness: There is no abdominal tenderness. There is no guarding or rebound.  Musculoskeletal: Normal range of motion.     Comments: Left hand: Moderate swelling to the left fourth finger primarily around the PIP joint.  All other fingers appear normal.  Skin is intact.  Tenderness to palpation of the left fourth PIP.  No erythema/streaking, fluctuance or  induration.  Resisted flexion and extension is intact at the left fourth finger, decreased end range of motion while making a fist secondary to pain.  No snuffbox tenderness to palpation. No tenderness to palpation over flexor sheath.  Finger adduction/abduction intact with 5/5 strength.  Thumb opposition intact. Full active and resisted ROM to flexion/extension at wrist, MCP, PIP and DIP of all other fingers.  FDS/FDP intact. Grip 5/5 strength.  Radial artery 2+ with <2sec cap refill in all fingers.  Sensation intact to light-tough in median/ulnar/radial distributions.  Compartments soft.  Skin:    General: Skin is warm and dry.  Neurological:     Mental Status: He is alert.     GCS: GCS eye subscore is 4. GCS verbal subscore is 5. GCS motor subscore is 6.     Comments: Speech is clear and goal oriented, follows commands Major Cranial nerves without deficit, no facial droop Moves extremities without ataxia, coordination intact  Psychiatric:        Behavior: Behavior normal.      ED Treatments / Results  Labs (all labs ordered are listed, but only abnormal results are displayed) Labs Reviewed - No data to display  EKG None  Radiology Dg Finger Ring Left  Result Date: 04/13/2019 CLINICAL DATA:  Fourth finger swelling after injury 2 weeks ago. EXAM: LEFT RING FINGER 2+V COMPARISON:  None. FINDINGS: There is no evidence of fracture or dislocation. There is no evidence of arthropathy or other focal bone abnormality. Soft tissues are unremarkable. IMPRESSION: Negative. Electronically Signed   By: Monte Fantasia M.D.   On: 04/13/2019 09:51    Procedures Procedures (including critical care time)  Medications Ordered in ED Medications - No data to display   Initial Impression / Assessment and Plan / ED Course  I have reviewed the triage vital signs and the nursing notes.  Pertinent labs & imaging results that were available during my care of the patient were reviewed by me and  considered in my medical decision making (see chart for details).    48 year old male with 2-week history of pain and swelling of the left fourth PIP after jamming the finger on an object while reaching into his trunk.  Capillary refill and sensation is intact, no erythema or overlying skin changes suggestive of infection.  Resisted flexion and extension at the joint intact, some decreased in range of motion with flexion secondary to pain.  Remainder of hand appears normal, strong radial pulse.  No skin break, compartments soft.  No evidence of cellulitis, septic arthritis, DVT, compartment syndrome, tenosynovitis or other emergent pathologies.  Will obtain plain film x-ray.  Additionally patient requesting refill of home blood pressure medication referral to primary care doctor, blood pressure today 131/91, he is asymptomatic regarding his blood pressure reading.  Patient's blood pressure medication will be refilled and he will be referred to local PCP, patient educated on signs and symptoms of hypertensive urgency/emergency and to return to the emergency department immediately if they occur. - DG Left Ring Finger:  FINDINGS:  There is no evidence of fracture or dislocation. There is no  evidence of arthropathy or other focal bone abnormality. Soft  tissues are unremarkable.    IMPRESSION:  Negative.  - Finger splint applied by nursing staff.  Discussed OTC anti-inflammatories and rice therapy.  Hand referral given, patient plans to call this week to schedule follow-up appointment.  Patient aware of limitations of plain film x-rays and a ligamentous and other soft tissue injuries may still be present and that hand follow-up was encouraged.  Patient's home antihypertensive prescribed and PCP referral given.  At this time there does not appear to be any evidence of an acute emergency medical condition and the patient appears stable for discharge with appropriate outpatient follow up. Diagnosis was  discussed with patient who verbalizes understanding of care plan and is agreeable to discharge. I have discussed return precautions with patient who verbalizes understanding of return precautions. Patient encouraged to follow-up with their PCP and hand. All questions answered. Patient has been discharged in good condition.  Note: Portions of this report may have been transcribed using voice recognition software. Every effort was made to ensure accuracy; however, inadvertent computerized transcription errors may still be present. Final Clinical Impressions(s) / ED Diagnoses   Final diagnoses:  Sprain of interphalangeal joint of left ring finger, initial encounter    ED Discharge Orders         Ordered    hydrochlorothiazide (HYDRODIURIL) 25 MG tablet  Daily     04/13/19 1020           Gari Crown 04/13/19 1741    Hayden Rasmussen, MD 04/13/19 1818    Deliah Boston, PA-C 06/10/19 0103    Hayden Rasmussen, MD 06/10/19 [phone]

## 2019-04-13 NOTE — ED Triage Notes (Signed)
Swelling to left 4th finger.  Pt sts he "jammed" it 2 weeks ago and "its been giving me trouble ever since".

## 2019-08-08 ENCOUNTER — Encounter: Payer: Self-pay | Admitting: Emergency Medicine

## 2019-08-08 ENCOUNTER — Other Ambulatory Visit: Payer: Self-pay

## 2019-08-08 ENCOUNTER — Emergency Department
Admission: EM | Admit: 2019-08-08 | Discharge: 2019-08-08 | Disposition: A | Discharge status: Home / Self Care | Payer: Self-pay | Attending: Emergency Medicine | Admitting: Emergency Medicine

## 2019-08-08 DIAGNOSIS — I10 Essential (primary) hypertension: Secondary | ICD-10-CM | POA: Insufficient documentation

## 2019-08-08 DIAGNOSIS — Z5321 Procedure and treatment not carried out due to patient leaving prior to being seen by health care provider: Secondary | ICD-10-CM | POA: Insufficient documentation

## 2019-08-08 NOTE — ED Triage Notes (Signed)
Pt to ED via POV c/o HTN. Pt states that he has been having issues for a while. Pt states that a few days ago his BP was 200/120's and he was having blurred vision and headache. Pt states that he has appt with his PCP on Tuesday and has been trying to make lifestyle changes to bring BP down. Pt states that he just came here to make his BP was ok.   RN informed pt that we do not do "blood pressure checks" and asked pt if he wanted to see MD. Pt states that he does not want to see the doctor because his kids are waiting in the car and he has to pick his wife up from work. Pt was informed that we are happy to see him if he wishes. Pt declines at this time, states that he will follow up with his PCP on Tuesday as scheduled.  Pt is not currently having any chest pain, blurred vision, or headaches, but was instructed that if his BP becomes elevated again or those symptoms return that he should be seen. Pt verbalized understanding. Pt was ambulatory out of triage with steady gait, in NAD.

## 2020-11-25 IMAGING — DX DG LUMBAR SPINE COMPLETE 4+V
5 series · 5 of 5 positions shown · non-contrast
Comparison: 09/10/2017.

CLINICAL DATA: Low back pain since a lifting injury on 04/16/2018.

EXAM:
LUMBAR SPINE - COMPLETE 4+ VIEW

[l-spine ap]
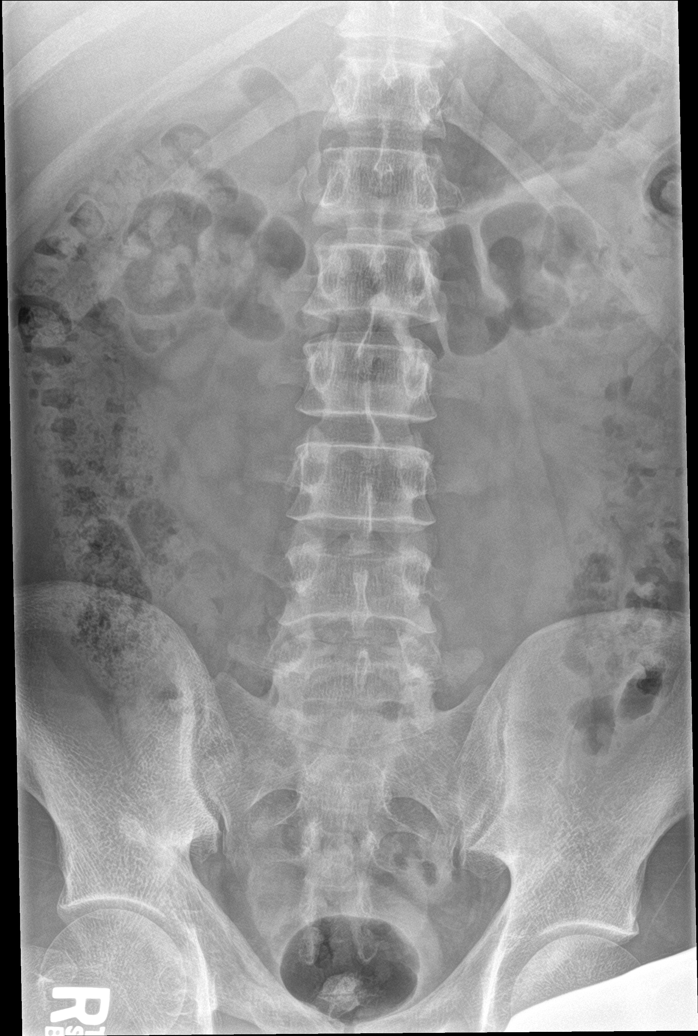

[l-spine obl (1 of 2)]
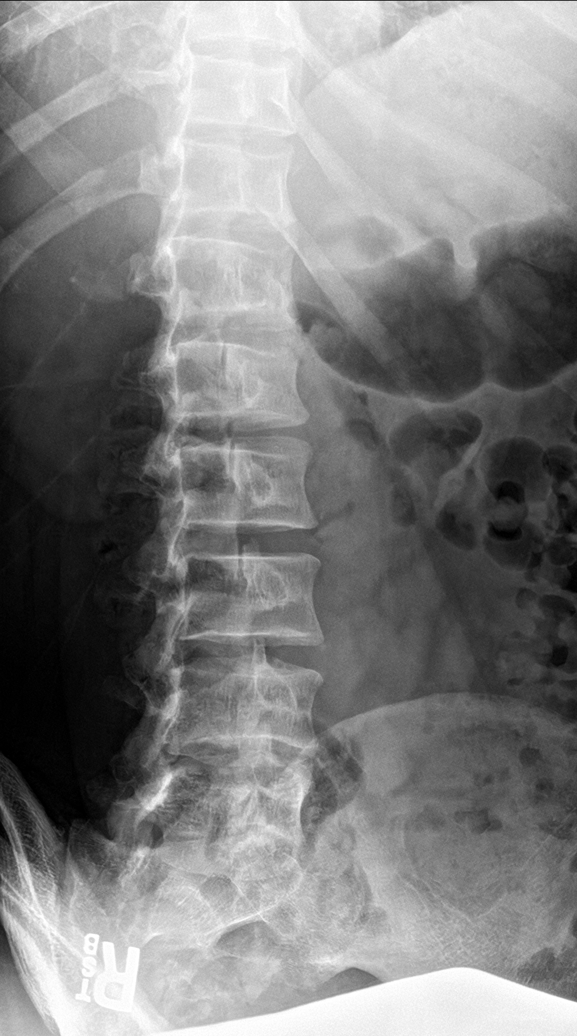

[l-spine lat]
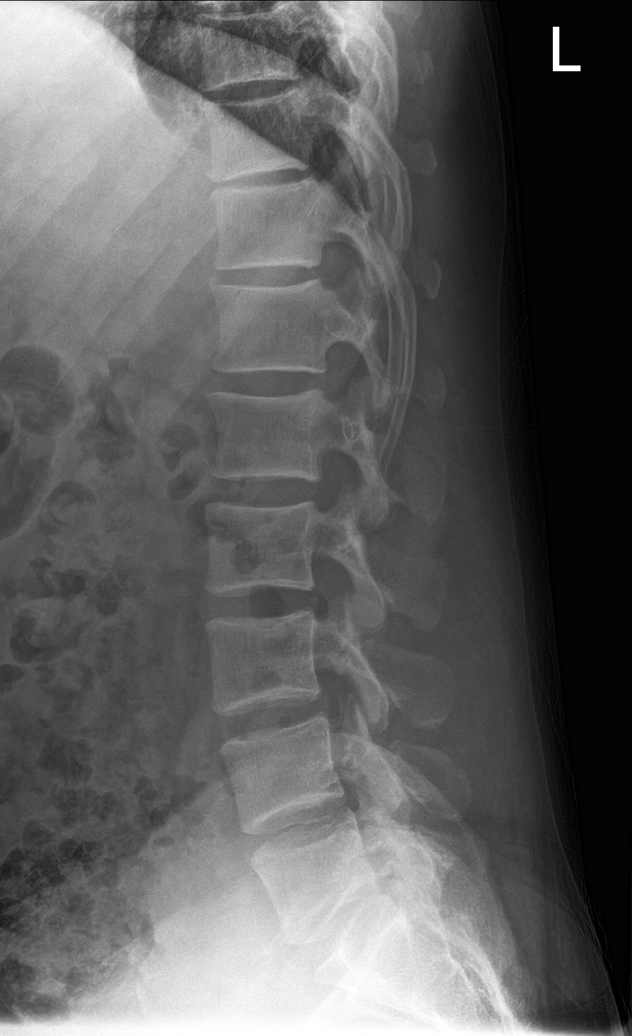

[l-spine spot]
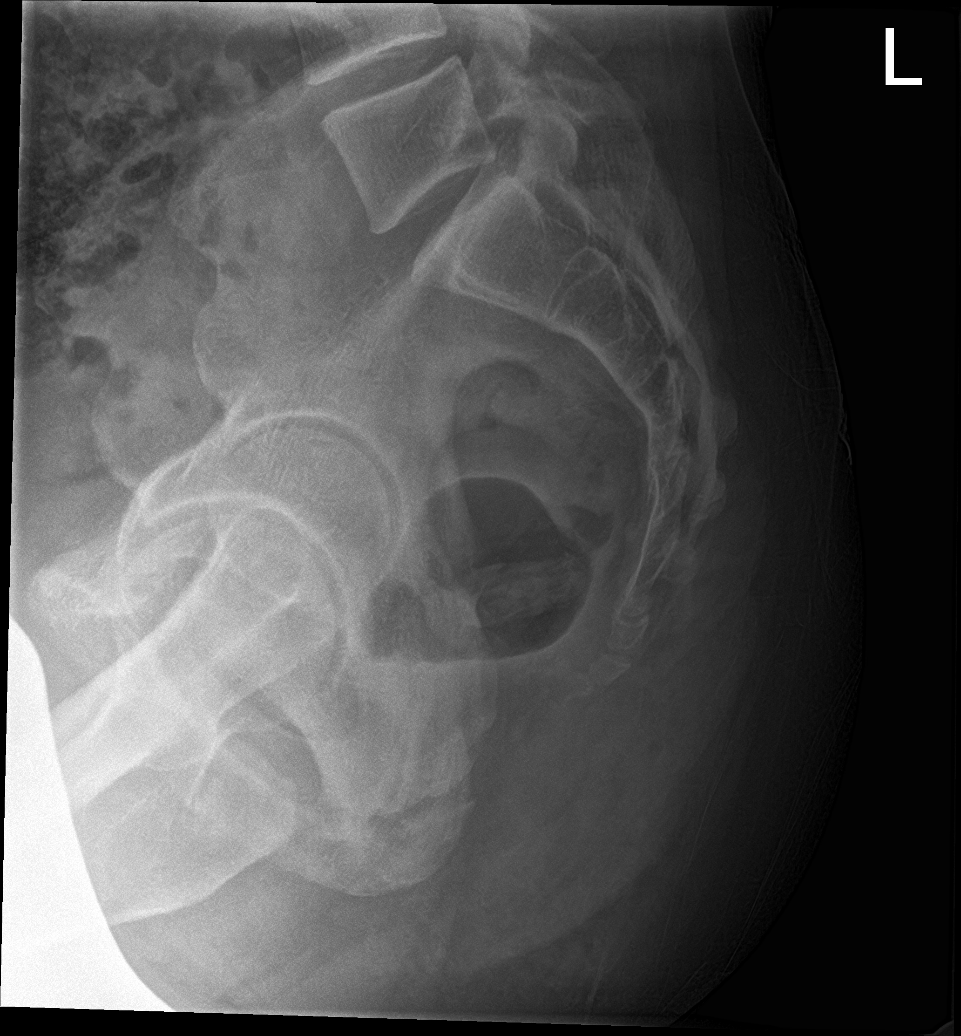

[l-spine obl (2 of 2)]
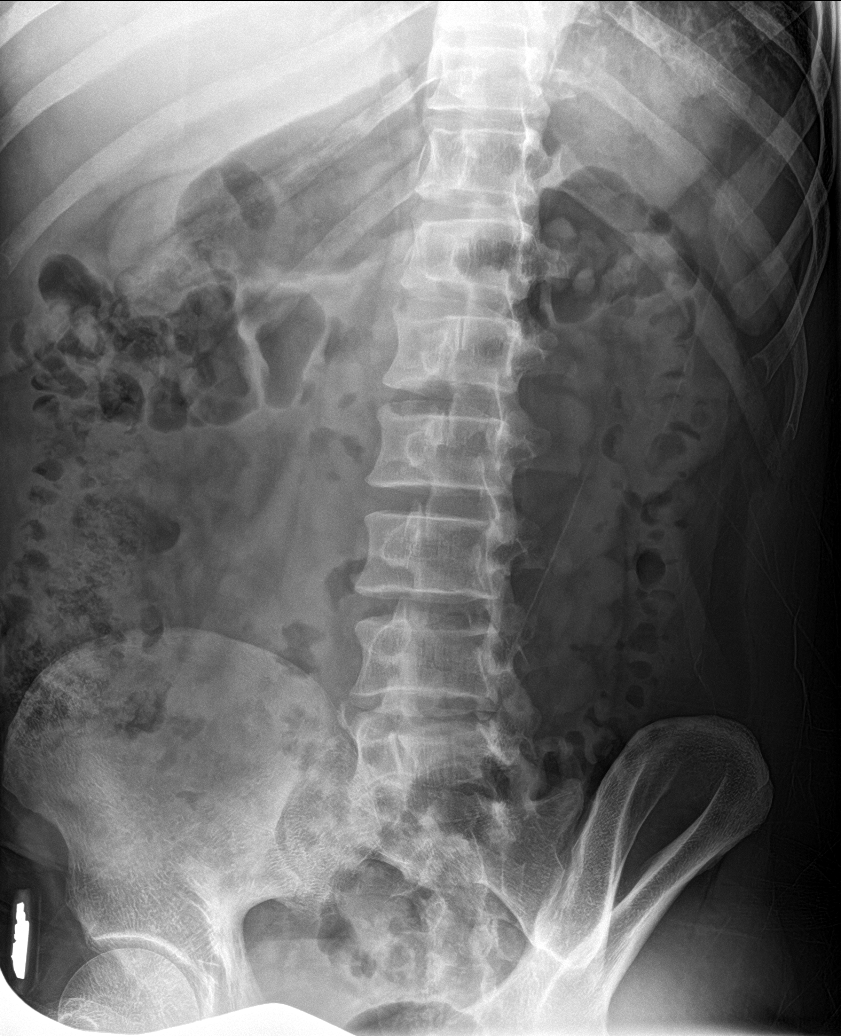

[5 of 5 positions shown; findings below may reference images not displayed]

FINDINGS: Transitional thoracolumbar vertebra followed by 5 non-rib-bearing
lumbar vertebrae. Counting purposes, the last open disc space is
labeled the L5-S1 level. Mild facet degenerative changes at the L4-5
and L5-S1 levels. No fractures, pars defects or subluxations are
seen.
IMPRESSION: Mild facet degenerative changes at the L4-5 and L5-S1 levels. No
acute abnormality.

## 2021-02-15 IMAGING — XA Imaging study
1 series · 1 of 1 positions shown · non-contrast
Comparison: none

CLINICAL DATA: Lumbosacral spondylosis without myelopathy. The only
has right low back pain radiating to the right lower extremity. He
has no left lower extremity pain. Request was made for bilateral
interlaminar epidural steroid injections. The patient did have some
reservations and was nervous but agree to a single injection. I
elected to perform a right L4-5 injection.

[Series 1: ortho adipose · 1 of 1 slices shown]
[im 1/1]
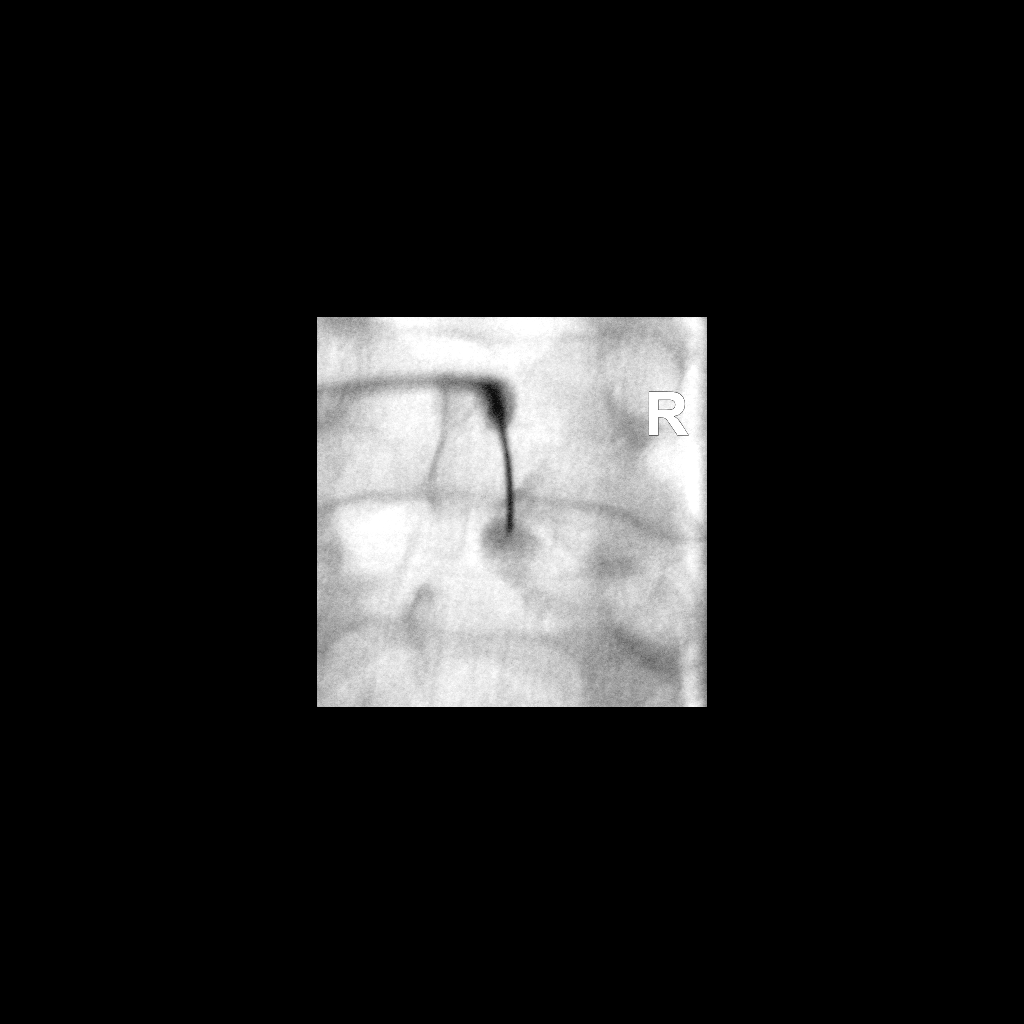

[1 of 1 positions shown; findings below may reference images not displayed]

FLUOROSCOPY TIME:  14 seconds.  1.8 mGy.

PROCEDURE:
The procedure, risks, benefits, and alternatives were explained to
the patient. Questions regarding the procedure were encouraged and
answered. The patient understands and consents to the procedure.

LUMBAR EPIDURAL INJECTION:

An interlaminar approach was performed on right at L4-5. The
overlying skin was cleansed and anesthetized. A 20 gauge epidural
needle was advanced using loss-of-resistance technique.

DIAGNOSTIC EPIDURAL INJECTION:

Injection of Isovue-M 200 shows a good epidural pattern with spread
above and below the level of needle placement, primarily on the
right no vascular opacification is seen.

THERAPEUTIC EPIDURAL INJECTION:

One hundred twenty mg of Depo-Medrol mixed with 4 cc 1% lidocaine
were instilled. The procedure was well-tolerated, and the patient
was discharged thirty minutes following the injection in good
condition.

COMPLICATIONS:
None
IMPRESSION: Technically successful epidural injection on the right L4-5 # 1.

## 2022-09-10 ENCOUNTER — Encounter: Payer: Self-pay | Admitting: *Deleted

## 2023-01-27 ENCOUNTER — Other Ambulatory Visit: Payer: Self-pay

## 2023-01-27 ENCOUNTER — Encounter: Payer: Self-pay | Admitting: Emergency Medicine

## 2023-01-27 ENCOUNTER — Emergency Department
Admission: EM | Admit: 2023-01-27 | Discharge: 2023-01-27 | Disposition: A | Discharge status: Home / Self Care | Payer: Self-pay | Attending: Emergency Medicine | Admitting: Emergency Medicine

## 2023-01-27 DIAGNOSIS — R102 Pelvic and perineal pain: Secondary | ICD-10-CM | POA: Insufficient documentation

## 2023-01-27 DIAGNOSIS — Z8546 Personal history of malignant neoplasm of prostate: Secondary | ICD-10-CM | POA: Insufficient documentation

## 2023-01-27 LAB — CBC
HCT: 44.1 % (ref 39.0–52.0)
Hemoglobin: 14.4 g/dL (ref 13.0–17.0)
MCH: 30.1 pg (ref 26.0–34.0)
MCHC: 32.7 g/dL (ref 30.0–36.0)
MCV: 92.1 fL (ref 80.0–100.0)
Platelets: 217 10*3/uL (ref 150–400)
RBC: 4.79 MIL/uL (ref 4.22–5.81)
RDW: 13.6 % (ref 11.5–15.5)
WBC: 4.4 10*3/uL (ref 4.0–10.5)
nRBC: 0 % (ref 0.0–0.2)

## 2023-01-27 LAB — COMPREHENSIVE METABOLIC PANEL
ALT: 52 U/L — ABNORMAL HIGH (ref 0–44)
AST: 96 U/L — ABNORMAL HIGH (ref 15–41)
Albumin: 4.3 g/dL (ref 3.5–5.0)
Alkaline Phosphatase: 100 U/L (ref 38–126)
Anion gap: 10 (ref 5–15)
BUN: 8 mg/dL (ref 6–20)
CO2: 24 mmol/L (ref 22–32)
Calcium: 9.3 mg/dL (ref 8.9–10.3)
Chloride: 104 mmol/L (ref 98–111)
Creatinine, Ser: 1.12 mg/dL (ref 0.61–1.24)
GFR, Estimated: >60 mL/min (ref >60)
Glucose, Bld: 87 mg/dL (ref 70–99)
Potassium: 4.3 mmol/L (ref 3.5–5.1)
Sodium: 138 mmol/L (ref 135–145)
Total Bilirubin: 0.7 mg/dL (ref 0.3–1.2)
Total Protein: 7.2 g/dL (ref 6.5–8.1)

## 2023-01-27 LAB — URINALYSIS, ROUTINE W REFLEX MICROSCOPIC
Bacteria, UA: NONE SEEN
Bilirubin Urine: NEGATIVE
Glucose, UA: NEGATIVE mg/dL
Hgb urine dipstick: NEGATIVE
Ketones, ur: NEGATIVE mg/dL
Leukocytes,Ua: NEGATIVE
Nitrite: NEGATIVE
Protein, ur: NEGATIVE mg/dL
Specific Gravity, Urine: 1.015 (ref 1.005–1.030)
pH: 6.5 (ref 5.0–8.0)

## 2023-01-27 LAB — LIPASE, BLOOD: Lipase: 28 U/L (ref 11–51)

## 2023-01-27 MED ORDER — CIPROFLOXACIN HCL 500 MG PO TABS: 500.0000 mg | ORAL_TABLET | Freq: Two times a day (BID) | ORAL | 0 refills | Status: AC

## 2023-01-27 NOTE — ED Triage Notes (Signed)
Pt here with abd pain. Pt state he has a hx of prostate cancer. Pt states he has been using the bathroom a lot lately. Pt states his pain is centered and sometimes radiates to his buttocks.  Pt ambulatory to triage.

## 2023-01-27 NOTE — ED Provider Notes (Addendum)
[facility] Provider Note   Event Date/Time   First MD Initiated Contact with Patient 01/27/23 1157     (approximate) History  Abdominal Pain  HPI Ronald [name] is a 52 y.o. male with a stated past medical history of prostate cancer without any current treatment or radiation who presents complaining of pelvic pain.  Patient states the only time this is worsened is 1 episode of odynorgasmia without any gross hematospermia.  Patient states that he has not had any difficulty with urination establishing stream but does state that he gets up at least twice at night to urinate.  Patient states that he is unclear as to what type of prostate cancer he has or what the treatment plan is at this time.  Patient states that he has been treated for prostatitis in the past with similar symptoms. ROS: Patient currently denies any vision changes, tinnitus, difficulty speaking, facial droop, sore throat, chest pain, shortness of breath,  nausea/vomiting/diarrhea, dysuria, or weakness/numbness/paresthesias in any extremity   Physical Exam  Triage Vital Signs: ED Triage Vitals  Encounter Vitals Group     BP 01/27/23 1129 (!) 148/106     Systolic BP Percentile --      Diastolic BP Percentile --      Pulse Rate 01/27/23 1129 (!) 56     Resp 01/27/23 1129 16     Temp 01/27/23 1129 97.6 F (36.4 C)     Temp Source 01/27/23 1129 Oral     SpO2 01/27/23 1129 96 %     Weight 01/27/23 1132 177 lb 11.1 oz (80.6 kg)     Height 01/27/23 1132 5\' 9"  (1.753 m)     Head Circumference --      Peak Flow --      Pain Score 01/27/23 1132 7     Pain Loc --      Pain Education --      Exclude from Growth Chart --    Most recent vital signs: Vitals:   01/27/23 1129 01/27/23 1231  BP: (!) 148/106 (!) 140/98  Pulse: (!) 56 60  Resp: 16 16  Temp: 97.6 F (36.4 C)   SpO2: 96% 96%   General: Awake, oriented x4. CV:  Good peripheral perfusion.  Resp:  Normal effort.  Abd:  No distention.   Other:  Middle-aged overweight African-American male resting comfortably in no acute distress ED Results / Procedures / Treatments  Labs (all labs ordered are listed, but only abnormal results are displayed) Labs Reviewed  COMPREHENSIVE METABOLIC PANEL - Abnormal; Notable for the following components:      Result Value   AST 96 (*)    ALT 52 (*)    All other components within normal limits  URINALYSIS, ROUTINE W REFLEX MICROSCOPIC - Abnormal; Notable for the following components:   APPearance CLEAR (*)    All other components within normal limits  LIPASE, BLOOD  CBC   PROCEDURES: Critical Care performed: No .1-3 Lead EKG Interpretation  Performed by: Merwyn Katos, MD Authorized by: Merwyn Katos, MD     Interpretation: normal     ECG rate:  71   ECG rate assessment: normal     Rhythm: sinus rhythm     Ectopy: none     Conduction: normal    MEDICATIONS ORDERED IN ED: Medications - No data to display IMPRESSION / MDM / ASSESSMENT AND PLAN / ED COURSE  I reviewed the triage vital signs and the nursing notes.  The patient is on the cardiac monitor to evaluate for evidence of arrhythmia and/or significant heart rate changes. Patient's presentation is most consistent with acute presentation with potential threat to life or bodily function. 52 year old male presents for pelvic pain.  Patient states that he was recently treated for urinary tract infection and symptoms improved but did not fully resolve No e/o epididymo-orchitis on exam and low suspicion for rectal abscess,  other GU deep space infection, gonorrhea/chlamydia. Unlikely Infected Urolithiasis, AAA, cholecystitis, pancreatitis, SBO, appendicitis, or other acute abdomen. Workup: UA: None Rx: Ciprofloxacin 500 mg twice daily x 10 days  Disposition: Discharge home. SRP discussed. Advise follow up with primary care provider within 24-72 hours.  Also given referral to urology for any  persistent pelvic pain   FINAL CLINICAL IMPRESSION(S) / ED DIAGNOSES   Final diagnoses:  Pelvic pain   Rx / DC Orders   ED Discharge Orders          Ordered    ciprofloxacin (CIPRO) 500 MG tablet  2 times daily        01/27/23 1218           Note:  This document was prepared using Dragon voice recognition software and may include unintentional dictation errors.   Merwyn Katos, MD 01/27/23 1821    Merwyn Katos, MD 01/27/23 [phone]

## 2023-01-27 NOTE — ED Notes (Signed)
See triage note  Presents with lower abd discomfort with urinary freq for couple of weeks  states sxs' have been intermittent

## 2023-06-28 ENCOUNTER — Emergency Department: Payer: Self-pay

## 2023-06-28 ENCOUNTER — Other Ambulatory Visit: Payer: Self-pay

## 2023-06-28 ENCOUNTER — Emergency Department
Admission: EM | Admit: 2023-06-28 | Discharge: 2023-06-28 | Discharge status: Against Medical Advice | Payer: Self-pay | Attending: Emergency Medicine | Admitting: Emergency Medicine

## 2023-06-28 DIAGNOSIS — R202 Paresthesia of skin: Secondary | ICD-10-CM | POA: Insufficient documentation

## 2023-06-28 DIAGNOSIS — Z5321 Procedure and treatment not carried out due to patient leaving prior to being seen by health care provider: Secondary | ICD-10-CM | POA: Insufficient documentation

## 2023-06-28 DIAGNOSIS — R079 Chest pain, unspecified: Secondary | ICD-10-CM | POA: Insufficient documentation

## 2023-06-28 LAB — CBC
HCT: 41.1 % (ref 39.0–52.0)
Hemoglobin: 14.3 g/dL (ref 13.0–17.0)
MCH: 31.2 pg (ref 26.0–34.0)
MCHC: 34.8 g/dL (ref 30.0–36.0)
MCV: 89.5 fL (ref 80.0–100.0)
Platelets: 227 10*3/uL (ref 150–400)
RBC: 4.59 MIL/uL (ref 4.22–5.81)
RDW: 12.2 % (ref 11.5–15.5)
WBC: 5.3 10*3/uL (ref 4.0–10.5)
nRBC: 0 % (ref 0.0–0.2)

## 2023-06-28 LAB — BASIC METABOLIC PANEL
Anion gap: 13 (ref 5–15)
BUN: 12 mg/dL (ref 6–20)
CO2: 24 mmol/L (ref 22–32)
Calcium: 9.6 mg/dL (ref 8.9–10.3)
Chloride: 102 mmol/L (ref 98–111)
Creatinine, Ser: 1.03 mg/dL (ref 0.61–1.24)
GFR, Estimated: >60 mL/min (ref >60)
Glucose, Bld: 89 mg/dL (ref 70–99)
Potassium: 4 mmol/L (ref 3.5–5.1)
Sodium: 139 mmol/L (ref 135–145)

## 2023-06-28 LAB — TROPONIN I (HIGH SENSITIVITY): Troponin I (High Sensitivity): 4 ng/L (ref <18)

## 2023-06-28 NOTE — ED Provider Triage Note (Signed)
Emergency Medicine Provider Triage Evaluation Note  Ronald [name] , a 53 y.o. male  was evaluated in triage.  Pt complains of chest pain x 3 days. Bilateral arm tingling intermittent x 3 days. No cardiac history.  Physical Exam  There were no vitals taken for this visit. Gen:   Awake, no distress   Resp:  Normal effort  MSK:   Moves extremities without difficulty  Other:    Medical Decision Making  Medically screening exam initiated at 4:13 PM.  Appropriate orders placed.  Ronald [name] was informed that the remainder of the evaluation will be completed by another provider, this initial triage assessment does not replace that evaluation, and the importance of remaining in the ED until their evaluation is complete.  Cardiac protocol started   Ronald [name], [place] 06/28/23 1615

## 2023-06-28 NOTE — ED Triage Notes (Signed)
Pt to ed from home via POV for chest pain and arm tingling x 3 days. Pt is caox4, in no acute distress and ambulatory in triage.

## 2023-06-28 NOTE — ED Notes (Addendum)
Patient reported to RN he had to pick up his kids and needed to leave. IV taken out.

## 2023-09-30 ENCOUNTER — Other Ambulatory Visit: Payer: Self-pay

## 2023-09-30 ENCOUNTER — Encounter: Payer: Self-pay | Admitting: Emergency Medicine

## 2023-09-30 ENCOUNTER — Emergency Department: Payer: Self-pay

## 2023-09-30 ENCOUNTER — Emergency Department
Admission: EM | Admit: 2023-09-30 | Discharge: 2023-09-30 | Discharge status: Against Medical Advice | Payer: Self-pay | Attending: Emergency Medicine | Admitting: Emergency Medicine

## 2023-09-30 DIAGNOSIS — I10 Essential (primary) hypertension: Secondary | ICD-10-CM | POA: Insufficient documentation

## 2023-09-30 DIAGNOSIS — Z5329 Procedure and treatment not carried out because of patient's decision for other reasons: Secondary | ICD-10-CM | POA: Insufficient documentation

## 2023-09-30 DIAGNOSIS — R030 Elevated blood-pressure reading, without diagnosis of hypertension: Secondary | ICD-10-CM

## 2023-09-30 DIAGNOSIS — Z711 Person with feared health complaint in whom no diagnosis is made: Secondary | ICD-10-CM

## 2023-09-30 NOTE — ED Notes (Signed)
 Pt is refusing to have blood drawn at this time  States he just had blood panel done lass then 2 weeks ago   He has a hx of anxiety

## 2023-09-30 NOTE — ED Provider Notes (Signed)
 [facility] Provider Note    Event Date/Time   First MD Initiated Contact with Patient 09/30/23 [phone]     (approximate)   History   Hypertension and Anxiety   HPI  Ronald [name] is a 53 y.o. male   presents to the ED with complaint of elevated blood pressure and problems with his anxiety for the last 2 weeks.  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  Patient reports that he does not remember this.  He denies any chest pain but does endorse some shortness of breath, headaches and has not taken his hydrochlorothiazide  in a couple days.  He denies any dizziness, nausea, vomiting or fever.  Patient reports that he saw his PCP approximately 2 weeks ago and had lab work done which he was told was normal.  History of hypertension.      Physical Exam   Triage Vital Signs: ED Triage Vitals  Encounter Vitals Group     BP 09/30/23 0847 (!) 148/96     Systolic BP Percentile --      Diastolic BP Percentile --      Pulse Rate 09/30/23 0847 60     Resp 09/30/23 0847 18     Temp 09/30/23 0847 97.8 F (36.6 C)     Temp Source 09/30/23 0847 Oral     SpO2 09/30/23 0847 97 %     Weight 09/30/23 0850 185 lb 1.6 oz (84 kg)     Height 09/30/23 0847 5\' 9"  (1.753 m)     Head Circumference --      Peak Flow --      Pain Score 09/30/23 0847 0     Pain Loc --      Pain Education --      Exclude from Growth Chart --     Most recent vital signs: Vitals:   09/30/23 0847  BP: (!) 148/96  Pulse: 60  Resp: 18  Temp: 97.8 F (36.6 C)  SpO2: 97%     General: Awake, no distress.  Alert, talkative, able to answer questions appropriately and is ambulatory without any assistance. CV:  Good peripheral perfusion.  Heart regular rate and rhythm. Resp:  Normal effort.  Lungs are clear bilaterally. Abd:  No distention.   Other:  Cranial nerves II through XII grossly intact, speech is normal.  Normal gait was noted.   ED Results / Procedures / Treatments   Labs (all labs ordered are listed, but only abnormal results are displayed) Labs Reviewed  CBC WITH DIFFERENTIAL/PLATELET  COMPREHENSIVE METABOLIC PANEL WITH GFR  URINALYSIS, ROUTINE W REFLEX MICROSCOPIC  TROPONIN I (HIGH SENSITIVITY)  TROPONIN I (HIGH SENSITIVITY)     EKG Vent. rate 53 BPM PR interval 162 ms QRS duration 88 ms QT/QTcB 450/422 ms P-R-T axes 77 81 75 Sinus bradycardia Otherwise normal ECG When compared with ECG of 28-Jun-2023 16:21, Nonspecific T wave abnormality no longer evident in Lateral leads    RADIOLOGY Chest x-ray images per radiology is negative for cardiopulmonary disease.    PROCEDURES:  Critical Care performed:   Procedures   MEDICATIONS ORDERED IN ED: Medications - No data to display   IMPRESSION / MDM / ASSESSMENT AND PLAN / ED COURSE  I reviewed the triage vital signs and the nursing notes.  Differential diagnosis includes, but is not limited to, hypertension, uncontrolled hypertension, sleep apnea, cardiac event, anxiety, hypokalemia, hyponatremia, anemia.  53 year old male presents to the ED with complaint of elevated blood pressure and anxiety problems for the last 2 weeks.  He also has not taken his hydrochlorothiazide  "for a while".  He does not remember his PCP but reports that he had blood work done 2 weeks ago.  Patient refused any lab work that was ordered here in the emergency department stating that he was told that his blood work was normal 2 weeks ago.  I did look for his results but apparently the doctor that he sees is not in care everywhere or in the [facility] system.  I explained this to the patient that his lab work from 2 weeks ago may have changed which would explain some of his symptoms.  Patient continued to refuse.  Chest x-ray was without acute cardiopulmonary disease.  EKG  showed sinus bradycardia with a ventricular rate of 53.  I was unable to compare a recent EKG with this.  Patient stated that he was going to have his doctor send the results of his lab work to the emergency department.  He then reported that he got a phone call from school and that he needed to pick up his children.  Patient left AMA.  He stopped at the front desk asking for a work note.  Nurses reported to me that patient had left the room.      Patient's presentation is most consistent with acute presentation with potential threat to life or bodily function.  FINAL CLINICAL IMPRESSION(S) / ED DIAGNOSES   Final diagnoses:  Elevated blood pressure reading  Feared complaint without diagnosis     Rx / DC Orders   ED Discharge Orders     None        Note:  This document was prepared using Dragon voice recognition software and may include unintentional dictation errors.   Stafford Eagles, PA-C 09/30/23 1322    Viviano Ground, MD 10/01/23 [phone]

## 2023-09-30 NOTE — ED Notes (Signed)
 Pt states he received a call that he had to pick up his child  Provider aware

## 2023-09-30 NOTE — ED Triage Notes (Signed)
 Pt arrived via POV with reports of elevated BP and anxiety problems x 2 weeks, pt states he has had some trouble with his job as well.  Pt reports he was on BP meds but has not taken in "a while"  pt states he was on hydrochlorothiazide  25mg .  Pt denies any pain at this time.

## 2023-09-30 NOTE — ED Notes (Signed)
 See triage note  Presents with some SOB  States he thinks this is d/t his blood pressure  Has not taken b/p meds for a while  Denies any pain  Respr even and non labored

## 2023-11-21 ENCOUNTER — Encounter: Payer: Self-pay | Admitting: Internal Medicine

## 2023-12-11 ENCOUNTER — Ambulatory Visit (AMBULATORY_SURGERY_CENTER)

## 2023-12-11 ENCOUNTER — Encounter: Payer: Self-pay | Admitting: Internal Medicine

## 2023-12-11 VITALS — Ht 69.0 in | Wt 178.0 lb

## 2023-12-11 DIAGNOSIS — Z1211 Encounter for screening for malignant neoplasm of colon: Secondary | ICD-10-CM

## 2023-12-11 MED ORDER — NA SULFATE-K SULFATE-MG SULF 17.5-3.13-1.6 GM/177ML PO SOLN: 1.0000 | Freq: Once | ORAL | 0 refills | Status: AC

## 2023-12-11 NOTE — Progress Notes (Signed)

## 2023-12-31 ENCOUNTER — Telehealth: Payer: Self-pay | Admitting: Pediatrics

## 2023-12-31 NOTE — Telephone Encounter (Signed)
 I was contacted on call by Ronald [name] reporting that he ate solid food today-lasagna and [place] chicken -he forgot that he needed to be on a clear liquid diet prior to colonoscopy.  Based upon the type and amount of food that he ate today I advised canceling his colonoscopy for tomorrow and rescheduling to a future date.  He reports having all of the bowel prep solution at home.  Will request that the office contact him to reschedule to a future date.

## 2024-01-01 ENCOUNTER — Encounter: Admitting: Internal Medicine

## 2024-01-01 NOTE — Telephone Encounter (Signed)
 Pt rescheduled for Monday 01/06/24 at 10am. New instructions sent to pt via MyChart. Pt has prep at home.

## 2024-01-06 ENCOUNTER — Ambulatory Visit (AMBULATORY_SURGERY_CENTER): Admitting: Internal Medicine

## 2024-01-06 ENCOUNTER — Encounter: Payer: Self-pay | Admitting: Internal Medicine

## 2024-01-06 VITALS — BP 113/74 | HR 60 | Temp 97.3°F | Resp 18 | Ht 69.0 in | Wt 178.0 lb

## 2024-01-06 DIAGNOSIS — D129 Benign neoplasm of anus and anal canal: Secondary | ICD-10-CM

## 2024-01-06 DIAGNOSIS — D120 Benign neoplasm of cecum: Secondary | ICD-10-CM

## 2024-01-06 DIAGNOSIS — D128 Benign neoplasm of rectum: Secondary | ICD-10-CM | POA: Diagnosis not present

## 2024-01-06 DIAGNOSIS — K5730 Diverticulosis of large intestine without perforation or abscess without bleeding: Secondary | ICD-10-CM | POA: Diagnosis not present

## 2024-01-06 DIAGNOSIS — K648 Other hemorrhoids: Secondary | ICD-10-CM

## 2024-01-06 DIAGNOSIS — Z1211 Encounter for screening for malignant neoplasm of colon: Secondary | ICD-10-CM | POA: Diagnosis present

## 2024-01-06 DIAGNOSIS — D123 Benign neoplasm of transverse colon: Secondary | ICD-10-CM

## 2024-01-06 MED ORDER — SODIUM CHLORIDE 0.9 % IV SOLN: 500.0000 mL | INTRAVENOUS | Status: DC

## 2024-01-06 NOTE — Patient Instructions (Signed)

## 2024-01-06 NOTE — Progress Notes (Signed)
 Pt's states no medical or surgical changes since previsit or office visit.

## 2024-01-06 NOTE — Op Note (Signed)
 [facility] Patient Name: Ronald [name] Procedure Date: 01/06/2024 11:12 AM MRN: [id number] Endoscopist: Rosario Estefana Kidney , , [id number] Age: 53 Referring MD:  Date of Birth: 1970/08/24 Gender: Male Account #: [id number] Procedure:                Colonoscopy Indications:              Screening for colorectal malignant neoplasm, This                            is the patient's first colonoscopy Medicines:                Monitored Anesthesia Care Procedure:                Pre-Anesthesia Assessment:                           - Prior to the procedure, a History and Physical                            was performed, and patient medications and                            allergies were reviewed. The patient's tolerance of                            previous anesthesia was also reviewed. The risks                            and benefits of the procedure and the sedation                            options and risks were discussed with the patient.                            All questions were answered, and informed consent                            was obtained. Prior Anticoagulants: The patient has                            taken no anticoagulant or antiplatelet agents. ASA                            Grade Assessment: II - A patient with mild systemic                            disease. After reviewing the risks and benefits,                            the patient was deemed in satisfactory condition to                            undergo the procedure.  After obtaining informed consent, the colonoscope                            was passed under direct vision. Throughout the                            procedure, the patient's blood pressure, pulse, and                            oxygen saturations were monitored continuously. The                            CF [id number] #[id number] was introduced through the anus                            and advanced to the  the terminal ileum. The                            colonoscopy was performed without difficulty. The                            patient tolerated the procedure well. The quality                            of the bowel preparation was excellent. The                            terminal ileum, ileocecal valve, appendiceal                            orifice, and rectum were photographed. Scope In: 11:32:23 AM Scope Out: 11:49:23 AM Scope Withdrawal Time: 0 hours 13 minutes 28 seconds  Total Procedure Duration: 0 hours 17 minutes 0 seconds  Findings:                 The terminal ileum appeared normal.                           Four sessile polyps were found in the transverse                            colon and cecum. The polyps were 3 to 10 mm in                            size. These polyps were removed with a cold snare.                            Resection and retrieval were complete.                           Multiple diverticula were found in the sigmoid                            colon and descending colon.  A 4 mm polyp was found in the rectum. The polyp was                            sessile. The polyp was removed with a cold snare.                            Resection and retrieval were complete.                           Non-bleeding internal hemorrhoids were found during                            retroflexion. Complications:            No immediate complications. Estimated Blood Loss:     Estimated blood loss was minimal. Impression:               - The examined portion of the ileum was normal.                           - Four 3 to 10 mm polyps in the transverse colon                            and in the cecum, removed with a cold snare.                            Resected and retrieved.                           - Diverticulosis in the sigmoid colon and in the                            descending colon.                           - One 4 mm polyp in  the rectum, removed with a cold                            snare. Resected and retrieved.                           - Non-bleeding internal hemorrhoids. Recommendation:           - Discharge patient to home (with escort).                           - Await pathology results.                           - The findings and recommendations were discussed                            with the patient. Dr Estefana Federico Rosario Estefana Federico,  01/06/2024 11:52:55 AM

## 2024-01-06 NOTE — Progress Notes (Signed)
 Report to PACU, RN, vss, BBS= Clear.

## 2024-01-06 NOTE — Progress Notes (Signed)
 GASTROENTEROLOGY PROCEDURE H&P NOTE   Primary Care Physician: Pcp, No    Reason for Procedure:   Colon cancer screening  Plan:    Colonoscopy  Patient is appropriate for endoscopic procedure(s) in the ambulatory ([facility]) setting.  The nature of the procedure, as well as the risks, benefits, and alternatives were carefully and thoroughly reviewed with the patient. Ample time for discussion and questions allowed. The patient understood, was satisfied, and agreed to proceed.     HPI: Ronald [name] is a 53 y.o. male who presents for colonoscopy for colon cancer screening. Denies blood in stools, changes in bowel habits, or unintentional weight loss. Denies family history of colon cancer.  Past Medical History:  Diagnosis Date   Hypertension     Past Surgical History:  Procedure Laterality Date   arm surgery     torn bicept    Prior to Admission medications   Medication Sig Start Date End Date Taking? Authorizing Provider  hydrochlorothiazide  (HYDRODIURIL ) 25 MG tablet Take 1 tablet (25 mg total) by mouth daily. Patient not taking: Reported on 12/11/2023 04/13/19   Donah Penne DELENA DEVONNA    Current Outpatient Medications  Medication Sig Dispense Refill   hydrochlorothiazide  (HYDRODIURIL ) 25 MG tablet Take 1 tablet (25 mg total) by mouth daily. (Patient not taking: Reported on 12/11/2023) 30 tablet 0   Current Facility-Administered Medications  Medication Dose Route Frequency Provider Last Rate Last Admin   0.9 %  sodium chloride  infusion  500 mL Intravenous Continuous Federico Rosario BROCKS, MD        Allergies as of 01/06/2024 - Review Complete 12/11/2023  Allergen Reaction Noted   Peanut oil Anaphylaxis 12/07/2021   Penicillins Anaphylaxis 01/14/2014   Sulfa antibiotics Anaphylaxis 03/21/2013   Penicillin g Itching 10/09/2012   Sulfadiazine Other (See Comments) 01/25/2015    Family History  Problem Relation Age of Onset   Hypertension Mother    Hypertension Father     Cancer Other    Colon cancer Neg Hx    Rectal cancer Neg Hx    Stomach cancer Neg Hx    Esophageal cancer Neg Hx     Social History   Socioeconomic History   Marital status: Divorced    Spouse name: Not on file   Number of children: 7   Years of education: Not on file   Highest education level: Not on file  Occupational History   Not on file  Tobacco Use   Smoking status: Former    Current packs/day: 0.00    Types: Cigars, Cigarettes    Quit date: 05/11/2015    Years since quitting: 8.6   Smokeless tobacco: Never  Vaping Use   Vaping status: Never Used  Substance and Sexual Activity   Alcohol use: Not Currently    Alcohol/week: 0.0 standard drinks of alcohol   Drug use: No   Sexual activity: Yes  Other Topics Concern   Not on file  Social History Narrative   Not on file   Social Drivers of Health   Financial Resource Strain: Not on file  Food Insecurity: Not on file  Transportation Needs: Not on file  Physical Activity: Not on file  Stress: Not on file  Social Connections: Not on file  Intimate Partner Violence: Not on file    Physical Exam: Vital signs in last 24 hours: BP (!) 136/93   Pulse 61   Temp (!) 97.3 F (36.3 C) (Temporal)   Ht 5' 9 (1.753 m)   Wt  178 lb (80.7 kg)   SpO2 99%   BMI 26.29 kg/m  GEN: NAD EYE: Sclerae anicteric ENT: MMM CV: Non-tachycardic Pulm: No increased work of breathing GI: Soft, NT/ND NEURO:  Alert & Oriented   Estefana Kidney, MD [facility]  01/06/2024 11:12 AM

## 2024-01-06 NOTE — Progress Notes (Signed)
 Called to room to assist during endoscopic procedure.  Patient ID and intended procedure confirmed with present staff. Received instructions for my participation in the procedure from the performing physician.

## 2024-01-07 ENCOUNTER — Telehealth: Payer: Self-pay

## 2024-01-07 NOTE — Telephone Encounter (Signed)
  Follow up Call-     01/06/2024   11:05 AM  Call back number  Post procedure Call Back phone  # [phone]  Permission to leave phone message Yes     Patient questions:  Do you have a fever, pain , or abdominal swelling? No. Pain Score  0 *  Have you tolerated food without any problems? Yes.    Have you been able to return to your normal activities? Yes.    Do you have any questions about your discharge instructions: Diet   No. Medications  No. Follow up visit  No.  Do you have questions or concerns about your Care? No.  Actions: * If pain score is 4 or above: No action needed, pain <4.

## 2024-01-08 ENCOUNTER — Ambulatory Visit: Payer: Self-pay | Admitting: Internal Medicine

## 2024-01-08 LAB — SURGICAL PATHOLOGY

## 2024-01-13 NOTE — Telephone Encounter (Signed)
 Patient called and stated that he is wanting to speak to the nurse to go over his pathology results. Patient is requesting a call back. Please advise.

## 2024-01-19 ENCOUNTER — Other Ambulatory Visit: Payer: Self-pay

## 2024-01-19 ENCOUNTER — Emergency Department: Admission: EM | Admit: 2024-01-19 | Discharge: 2024-01-19 | Disposition: A | Discharge status: Home / Self Care

## 2024-01-19 DIAGNOSIS — I10 Essential (primary) hypertension: Secondary | ICD-10-CM | POA: Insufficient documentation

## 2024-01-19 DIAGNOSIS — R253 Fasciculation: Secondary | ICD-10-CM | POA: Diagnosis present

## 2024-01-19 NOTE — ED Provider Notes (Signed)
 [facility] Provider Note    Event Date/Time   First MD Initiated Contact with Patient 01/19/24 [phone]     (approximate)   History   No chief complaint on file.   HPI  Ronald [name] is a 53 y.o. male with PMH of hypertension who presents for evaluation of twitching in his arms bilaterally and in the right eye.  Patient states he noticed twitching in his left arm about 3 years ago.  The twitching in his right arm and the eye began about a month ago.  Patient is also noticed some cramping at the bottom of his feet but attributes this to his footwear.  Patient is most concerned about development of ALS given that the muscle twitching is moving to different parts of his body.  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  He has not noticed any numbness or tingling in his hands or fingers.  He has not had any weakness.  No family history of ALS.      Physical Exam   Triage Vital Signs: ED Triage Vitals  Encounter Vitals Group     BP 01/19/24 0807 (!) 142/100     Girls Systolic BP Percentile --      Girls Diastolic BP Percentile --      Boys Systolic BP Percentile --      Boys Diastolic BP Percentile --      Pulse Rate 01/19/24 0807 (!) 55     Resp 01/19/24 0807 16     Temp 01/19/24 0807 97.7 F (36.5 C)     Temp Source 01/19/24 0807 Oral     SpO2 01/19/24 0807 98 %     Weight 01/19/24 0808 183 lb 8 oz (83.2 kg)     Height 01/19/24 0808 5' 9 (1.753 m)     Head Circumference --      Peak Flow --      Pain Score 01/19/24 0807 0     Pain Loc --      Pain Education --      Exclude from Growth Chart --     Most recent vital signs: Vitals:   01/19/24 0807  BP: (!) 142/100  Pulse: (!) 55  Resp: 16  Temp: 97.7 F (36.5 C)  SpO2: 98%   General: Awake, no distress.  CV:  Good peripheral perfusion.  RRR. Resp:  Normal effort.  CTAB Abd:  No distention.  Other:  Radial pulse and  posterior tibialis pulse are 2+ and regular.  Upper and lower extremity strength is 5 out of 5 bilaterally.  Sensation maintained in upper and lower extremities.   ED Results / Procedures / Treatments   Labs (all labs ordered are listed, but only abnormal results are displayed) Labs Reviewed - No data to display  PROCEDURES:  Critical Care performed: No  Procedures   MEDICATIONS ORDERED IN ED: Medications - No data to display   IMPRESSION / MDM / ASSESSMENT AND PLAN / ED COURSE  I reviewed the triage vital signs and the nursing notes.                             53 year old male presents for evaluation of muscle twitching.  Blood pressure is elevated but patient has history of hypertension.  Heart rate is a little bit low at 55 but otherwise normal  vital signs.  Patient NAD on exam.  Differential diagnosis includes, but is not limited to, anxiety, electrolyte abnormality, caffeine use, post exercise muscle twitching, medication side effect, less likely ALS.  Patient's presentation is most consistent with acute, uncomplicated illness.  Physical exam is overall reassuring and that patient does not have any loss of sensation or weakness.  I explained to the patient that since he does not have any neurological deficits I did not feel imaging was needed at this time.  Given his specific concern for ALS, I have very low suspicion for this since there has not been any progressive muscle weakness or atrophy.  I suspect that his symptoms may be related to an electrolyte imbalance versus caffeine use versus stress.  I discussed taking and over-the-counter magnesium supplement as well as trying a water enhancers like L MMT to increase hydration.  We discussed limiting caffeine use.  I advised patient to follow-up with a neurologist as well as his primary care provider.  He was provided follow-up information.  We reviewed return precautions including sudden onset muscle weakness.  Patient  voiced understanding, all questions were answered and he was stable at discharge.      FINAL CLINICAL IMPRESSION(S) / ED DIAGNOSES   Final diagnoses:  Muscle twitching     Rx / DC Orders   ED Discharge Orders     None        Note:  This document was prepared using Dragon voice recognition software and may include unintentional dictation errors.   Cleaster Tinnie LABOR, PA-C 01/19/24 0920    Clarine Ozell LABOR, MD 01/20/24 [phone]

## 2024-01-19 NOTE — Discharge Instructions (Signed)
 Please limit your caffeine use as this may be a potential cause for your symptoms.  I recommend taking magnesium glycinate.  This is an over-the-counter supplement.  Please follow dosing instructions on the bottle.  Make sure you stay well-hydrated.  You should drink at least 60 mL of water per day.  You can use water enhancers like LMNT to help increase hydration.  Schedule a follow-up appointment with the neurologist whose information is attached.  Return to the emergency department with any worsening symptoms like sudden onset of muscle weakness.

## 2024-01-19 NOTE — ED Triage Notes (Signed)
 Pt arrives via POV with c/o twitching in their arms bilaterally and in the right eye. Pt states that the left arm started twitching 3 years ago, the right arm & eye started twitching 1 month ago. Pt also reports cramping in the bottom of their feet that started 3-4 months ago. Pt is concerned that it might be a mineral deficiency or ALS. Pt is A&Ox4 and ambulatory during triage.

## 2024-03-11 ENCOUNTER — Other Ambulatory Visit: Payer: Self-pay

## 2024-03-11 ENCOUNTER — Emergency Department
Admission: EM | Admit: 2024-03-11 | Discharge: 2024-03-11 | Disposition: A | Discharge status: Home / Self Care | Attending: Emergency Medicine | Admitting: Emergency Medicine

## 2024-03-11 DIAGNOSIS — I10 Essential (primary) hypertension: Secondary | ICD-10-CM | POA: Diagnosis not present

## 2024-03-11 DIAGNOSIS — G629 Polyneuropathy, unspecified: Secondary | ICD-10-CM | POA: Insufficient documentation

## 2024-03-11 DIAGNOSIS — J45909 Unspecified asthma, uncomplicated: Secondary | ICD-10-CM | POA: Diagnosis not present

## 2024-03-11 LAB — HEMOGLOBIN A1C
Hgb A1c MFr Bld: 5.5 % (ref 4.8–5.6)
Mean Plasma Glucose: 111.15 mg/dL

## 2024-03-11 MED ORDER — GABAPENTIN 300 MG PO CAPS: 300.0000 mg | ORAL_CAPSULE | Freq: Every evening | ORAL | 0 refills | Status: AC | PRN

## 2024-03-11 NOTE — Discharge Instructions (Addendum)
 You have been diagnosed with peripheral neuropathy.  Please take gabapentin  1 tablet at bedtime.  Please call [facility] to make an appointment to establish care with a primary care physician.  Please call Dr. Merrianne neurologist and make an appointment for a follow-up.  Please check on MyChart the results of A1c .  A1c less than 5.7 is normal, between 5.7 and 6.4 is prediabetes, more than 6.4 is diabetes. Please come back to ED or go to your PCP if you have new symptoms or symptoms worsen.  It was a pleasure to help you today.  Ronald Carbon, PA-C

## 2024-03-11 NOTE — ED Provider Notes (Signed)
 [facility] Provider Note    Event Date/Time   First MD Initiated Contact with Patient 03/11/24 RONOLD     (approximate)   History   Toe Pain    HPI  Ronald Ronald [name] is Ronald [name] 53 y.o. male    with Ronald [name] past medical history of hypertension, chest pain, asthma,  who presents to the ED complaining of burning sensation to left 2nd and 3rd toes. According to the patient, symptoms started last week with Ronald [name] burning sensation of the left 2nd and 3rd toes.  Patient denies history of diabetes.  He has family history of diabetes, both parents.  Patient denies polydipsia, polyphagia, polyuria.  Patient endorses he was here before for left arm muscle twisting, patient is having no bilateral upper arms numbness that is not constant, they are coming and go with temperature changes.     Patient Active Problem List   Diagnosis Date Noted   Ulnar neuropathy at elbow of left upper extremity 02/17/2019   Lateral epicondylitis of left elbow 02/17/2019   Low back pain 09/14/2017   Chest pain 12/15/2015   Lower leg injury 01/19/2015     ROS: Patient currently denies any vision changes, tinnitus, difficulty speaking, facial droop, sore throat, chest pain, shortness of breath, abdominal pain, nausea/vomiting/diarrhea, dysuria, or weakness/numbness/paresthesias in any extremity   Physical Exam   Triage Vital Signs: ED Triage Vitals  Encounter Vitals Group     BP 03/11/24 1640 (!) 142/96     Girls Systolic BP Percentile --      Girls Diastolic BP Percentile --      Boys Systolic BP Percentile --      Boys Diastolic BP Percentile --      Pulse Rate 03/11/24 1640 72     Resp 03/11/24 1640 17     Temp 03/11/24 1640 98.2 F (36.8 C)     Temp Source 03/11/24 1640 Oral     SpO2 03/11/24 1640 100 %     Weight 03/11/24 1641 186 lb (84.4 kg)     Height 03/11/24 1641 5' 9 (1.753 m)     Head Circumference --      Peak Flow --      Pain Score 03/11/24 1645 0     Pain Loc --      Pain  Education --      Exclude from Growth Chart --     Most recent vital signs: Vitals:   03/11/24 1640  BP: (!) 142/96  Pulse: 72  Resp: 17  Temp: 98.2 F (36.8 C)  SpO2: 100%     Physical Exam Vitals and nursing note reviewed.  In triage patient was hypertensive  General:          Awake, no distress.  CV:                  Good peripheral perfusion.  Resp:               Normal effort. no tachypnea Abd:                 No distention.  Soft nontender Other:              Left foot: Skin is intact, 2nd and 3rd toes full ROM, no tenderness to palpation, no signs of infection.  Sensation is intact.  ED Results / Procedures / Treatments   Labs (all labs ordered are listed, but only abnormal results are displayed) Labs Reviewed  HEMOGLOBIN A1C      RADIOLOGY I independently reviewed and interpreted imaging and agree with radiologists findings.      PROCEDURES:  Critical Care performed:   Procedures   MEDICATIONS ORDERED IN ED: Medications - No data to display    IMPRESSION / MDM / ASSESSMENT AND PLAN / ED COURSE  I reviewed the triage vital signs and the nursing notes.  Differential diagnosis includes, but is not limited to, neuropathy, diabetes, unlikely collagen disease  Patient's presentation is most consistent with Ronald [name], Ronald illness.   Ronald Ronald [name] is Ronald [name] 53 y.o., male presents today with history of 1 week of left foot 2nd and 3rd toe burning sensation.  On Ronald [name] physical exam of the left foot skin is intact, no signs of infection, sensation is intact in 2nd and 3rd toe, full ROM.  Pulses positive. Plan A1c to check for diabetes, patient will check MyChart tomorrow and I will explain on discharge how to interpret results.  I will give referral to [facility] clinic, to establish care with Ronald [name] PCP.  I will refer patient to neurology.  I will prescribe gabapentin  for neuropathic pain. Patient's diagnosis is consistent with peripheral neuropathy of the 2nd and 3rd  left toes. I did no order any imaging, physical exam is reassuring, no signs of fracture, no deformities.  A1c is pending. I did review the patient's allergies and medications.The patient is in stable and satisfactory condition for discharge home  Patient will be discharged home with prescriptions for pending. Patient is to follow up with neurology as needed or otherwise directed. Patient is given ED precautions to return to the ED for any worsening or new symptoms.  I will refer patient to [facility] to establish care.  I did advise patient to take gabapentin  at night. Discussed plan of care with patient, answered all of patient's questions, Patient agreeable to plan of care. Advised patient to take medications according to the instructions on the label. Discussed possible side effects of new medications. Patient verbalized understanding.  FINAL CLINICAL IMPRESSION(S) / ED DIAGNOSES   Final diagnoses:  Neuropathy     Rx / DC Orders   ED Discharge Orders          Ordered    gabapentin  (NEURONTIN ) 300 MG capsule  At bedtime PRN        03/11/24 1921             Note:  This document was prepared using Dragon voice recognition software and may include unintentional dictation errors.   Janit Kast, PA-C 03/11/24 1924    Suzanne Kirsch, MD 03/11/24 2024

## 2024-03-11 NOTE — ED Triage Notes (Signed)
 PT arrives via POV. PT reports burning/stinging sensation to left 2nd and 3rd toes for about 1 week. Denies injury. Cms intact

## 2024-04-27 ENCOUNTER — Encounter (HOSPITAL_COMMUNITY): Payer: Self-pay

## 2024-04-27 ENCOUNTER — Emergency Department (HOSPITAL_COMMUNITY)
Admission: EM | Admit: 2024-04-27 | Discharge: 2024-04-27 | Disposition: A | Discharge status: Home / Self Care | Attending: Emergency Medicine | Admitting: Emergency Medicine

## 2024-04-27 ENCOUNTER — Other Ambulatory Visit: Payer: Self-pay

## 2024-04-27 DIAGNOSIS — I10 Essential (primary) hypertension: Secondary | ICD-10-CM | POA: Insufficient documentation

## 2024-04-27 DIAGNOSIS — R509 Fever, unspecified: Secondary | ICD-10-CM | POA: Diagnosis present

## 2024-04-27 DIAGNOSIS — J069 Acute upper respiratory infection, unspecified: Secondary | ICD-10-CM | POA: Insufficient documentation

## 2024-04-27 DIAGNOSIS — Z91010 Allergy to peanuts: Secondary | ICD-10-CM | POA: Diagnosis not present

## 2024-04-27 LAB — GROUP A STREP BY PCR: Group A Strep by PCR: NOT DETECTED

## 2024-04-27 LAB — RESP PANEL BY RT-PCR (RSV, FLU A&B, COVID)  RVPGX2
Influenza A by PCR: NEGATIVE
Influenza B by PCR: NEGATIVE
Resp Syncytial Virus by PCR: NEGATIVE
SARS Coronavirus 2 by RT PCR: NEGATIVE

## 2024-04-27 MED ORDER — EUCERIN ORIGINAL HEALING EX CREA: TOPICAL_CREAM | CUTANEOUS | 0 refills | Status: AC | PRN

## 2024-04-27 MED ORDER — CARBAMIDE PEROXIDE 6.5 % OT SOLN: 5.0000 [drp] | Freq: Two times a day (BID) | OTIC | 0 refills | Status: AC | PRN

## 2024-04-27 NOTE — ED Triage Notes (Signed)
 Pt said he had flu like symptoms over the weekend including high fever, fever broke yesterday but this morning woke up with hoarseness and sore throat.

## 2024-04-27 NOTE — ED Provider Triage Note (Signed)
 Emergency Medicine Provider Triage Evaluation Note  Ronald [name] , a 53 y.o. male  was evaluated in triage.  Pt complains of flu sxs. Endorse fever, chills, runny nose, sneeze, cough, sore throat, headache, body aches, rash to lower back and persistent nausea and vomiting for several days.  Recent sick contact  Review of Systems  Positive: As above Negative: As above  Physical Exam  BP (!) 144/97   Pulse 77   Temp (!) 97.4 F (36.3 C)   Resp 18   Ht 5' 9 (1.753 m)   Wt 81.2 kg   SpO2 96%   BMI 26.43 kg/m  Gen:   Awake, no distress   Resp:  Normal effort  MSK:   Moves extremities without difficulty  Other:    Medical Decision Making  Medically screening exam initiated at 12:39 PM.  Appropriate orders placed.  Delaine Sasso was informed that the remainder of the evaluation will be completed by another provider, this initial triage assessment does not replace that evaluation, and the importance of remaining in the ED until their evaluation is complete.     Nivia Colon, PA-C 04/27/24 1243

## 2024-05-01 NOTE — ED Provider Notes (Signed)
 [place] EMERGENCY DEPARTMENT AT [facility] Provider Note   CSN: [id number] Arrival date & time: 04/27/24  1204     Patient presents with: Flu Like Symptoms   Ronald [name] is a 53 y.o. male.   Pt is a 53 yo male with pmhx significant for HTN.  He said he became sick over the weekend.  He had fevers/chills/sore throat/body aches and n/v.  He is starting to feel better today, but called in sick to work and needed a note for work.  He does complain of a rash to his back and wax in his ears.  He was able to eat and drink this am.       Prior to Admission medications   Medication Sig Start Date End Date Taking? Authorizing Provider  carbamide peroxide (DEBROX) 6.5 % OTIC solution Place 5 drops into both ears 2 (two) times daily as needed (ear wax). 04/27/24  Yes Dean Clarity, MD  Skin Protectants, Misc. (EUCERIN) cream Apply topically as needed for dry skin. 04/27/24  Yes Dean Clarity, MD  gabapentin  (NEURONTIN ) 300 MG capsule Take 1 capsule (300 mg total) by mouth at bedtime as needed. 03/11/24   Evans, Alexandra, PA-C  hydrochlorothiazide  (HYDRODIURIL ) 25 MG tablet Take 1 tablet (25 mg total) by mouth daily. Patient not taking: No sig reported 04/13/19   Donah Riis A, PA-C    Allergies: Peanut oil, Penicillins, and Sulfa antibiotics    Review of Systems  Constitutional:  Positive for chills and fever.  HENT:  Positive for rhinorrhea and sore throat.   Gastrointestinal:  Positive for nausea and vomiting.  All other systems reviewed and are negative.   Updated Vital Signs BP (!) 144/97   Pulse 77   Temp (!) 97.4 F (36.3 C)   Resp 18   Ht 5' 9 (1.753 m)   Wt 81.2 kg   SpO2 96%   BMI 26.43 kg/m   Physical Exam Vitals and nursing note reviewed.  Constitutional:      Appearance: Normal appearance.  HENT:     Head: Normocephalic and atraumatic.     Right Ear: External ear normal. There is impacted cerumen.     Left Ear: External ear normal. There  is impacted cerumen.     Nose: Nose normal.     Mouth/Throat:     Mouth: Mucous membranes are moist.     Pharynx: Oropharynx is clear.  Eyes:     Extraocular Movements: Extraocular movements intact.     Conjunctiva/sclera: Conjunctivae normal.     Pupils: Pupils are equal, round, and reactive to light.  Cardiovascular:     Rate and Rhythm: Normal rate and regular rhythm.     Pulses: Normal pulses.     Heart sounds: Normal heart sounds.  Pulmonary:     Effort: Pulmonary effort is normal.     Breath sounds: Normal breath sounds.  Abdominal:     General: Abdomen is flat. Bowel sounds are normal.     Palpations: Abdomen is soft.  Musculoskeletal:        General: Normal range of motion.     Cervical back: Normal range of motion and neck supple.  Skin:    Capillary Refill: Capillary refill takes less than 2 seconds.     Comments: Eczema mid low back  Neurological:     General: No focal deficit present.     Mental Status: He is alert and oriented to person, place, and time.  Psychiatric:  Mood and Affect: Mood normal.        Behavior: Behavior normal.     (all labs ordered are listed, but only abnormal results are displayed) Labs Reviewed  RESP PANEL BY RT-PCR (RSV, FLU A&B, COVID)  [id number]  GROUP A STREP BY PCR    EKG: None  Radiology: No results found.   Procedures   Medications Ordered in the ED - No data to display                                  Medical Decision Making Risk OTC drugs.   This patient presents to the ED for concern of uri sx, this involves an extensive number of treatment options, and is a complaint that carries with it a high risk of complications and morbidity.  The differential diagnosis includes covid/flu/rsv/strep   Co morbidities that complicate the patient evaluation  htn   Additional history obtained:  Additional history obtained from epic chart review  Lab Tests:  I Ordered, and personally interpreted labs.  The  pertinent results include:  covid/flu/rsv/strep neg  Medicines ordered and prescription drug management:   I have reviewed the patients home medicines and have made adjustments as needed  Problem List / ED Course:  URI sx:  covid/flu/rsv/strep neg.  Sx are improving.  Pt provided with a note for work. Rash:  looks to be eczema.  Pt d/c with eucerin. Ear wax:  pt given a rx for debrox.   Reevaluation:  After the interventions noted above, I reevaluated the patient and found that they have :improved   Social Determinants of Health:  Lives at home   Dispostion:  After consideration of the diagnostic results and the patients response to treatment, I feel that the patent would benefit from discharge with outpatient f/u.       Final diagnoses:  Viral upper respiratory tract infection    ED Discharge Orders          Ordered    carbamide peroxide (DEBROX) 6.5 % OTIC solution  2 times daily PRN        04/27/24 1636    Skin Protectants, Misc. (EUCERIN) cream  As needed        04/27/24 1636               Dean Clarity, MD 05/01/24 [phone]

## 2024-05-22 ENCOUNTER — Emergency Department
Admission: EM | Admit: 2024-05-22 | Discharge: 2024-05-22 | Discharge status: Against Medical Advice | Attending: Emergency Medicine | Admitting: Emergency Medicine

## 2024-05-22 ENCOUNTER — Emergency Department

## 2024-05-22 ENCOUNTER — Other Ambulatory Visit: Payer: Self-pay

## 2024-05-22 DIAGNOSIS — Z5321 Procedure and treatment not carried out due to patient leaving prior to being seen by health care provider: Secondary | ICD-10-CM | POA: Insufficient documentation

## 2024-05-22 DIAGNOSIS — R519 Headache, unspecified: Secondary | ICD-10-CM | POA: Diagnosis present

## 2024-05-22 NOTE — ED Triage Notes (Signed)
 Pt to ed from home via POV for a Knot on the left side of my head that is pushing on my sinuses. 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 Pt is caox4, in no acute distress and ambulatory to triage.

## 2024-05-22 NOTE — ED Notes (Signed)
 Pt to desk multiple times asking to leave. MD made aware.

## 2024-05-25 ENCOUNTER — Telehealth: Payer: Self-pay | Admitting: Emergency Medicine

## 2024-05-25 NOTE — Telephone Encounter (Signed)
 Called patient due to left emergency department before provider exam to inquire about condition and follow up plans. He says he does plan to contact pcp as soon as they open.  He had read the CT result and asking about aneurism.  I explained that the CT is not conculusive. So he nee needs to do follow up.  Also advised that it is okay to do follow up with pcp as long as he does not have severe headach.  Told him that if he has severe headache, he needs to return to ED.

## 2024-06-04 ENCOUNTER — Other Ambulatory Visit: Payer: Self-pay

## 2024-06-04 DIAGNOSIS — R930 Abnormal findings on diagnostic imaging of skull and head, not elsewhere classified: Secondary | ICD-10-CM

## 2024-06-05 ENCOUNTER — Ambulatory Visit: Admission: RE | Admit: 2024-06-05 | Discharge: 2024-06-05 | Disposition: A | Source: Ambulatory Visit

## 2024-06-05 DIAGNOSIS — R930 Abnormal findings on diagnostic imaging of skull and head, not elsewhere classified: Secondary | ICD-10-CM

## 2024-06-05 MED ORDER — GADOPICLENOL 0.5 MMOL/ML IV SOLN
10.0000 mL | Freq: Once | INTRAVENOUS | Status: AC | PRN
  Administered 2024-06-05: 8 mL via INTRAVENOUS

## 2024-06-08 ENCOUNTER — Other Ambulatory Visit: Payer: Self-pay

## 2024-06-08 DIAGNOSIS — R9389 Abnormal findings on diagnostic imaging of other specified body structures: Secondary | ICD-10-CM

## 2024-06-08 DIAGNOSIS — R930 Abnormal findings on diagnostic imaging of skull and head, not elsewhere classified: Secondary | ICD-10-CM

## 2024-06-30 ENCOUNTER — Ambulatory Visit: Payer: Self-pay

## 2024-08-05 ENCOUNTER — Ambulatory Visit
